# Patient Record
Sex: Male | Born: 1970 | Race: Black or African American | Hispanic: No | Marital: Single
Health system: Midwestern US, Community
[De-identification: ages and names within clinical notes are randomized; demographics above are authoritative.]

## PROBLEM LIST (undated history)

## (undated) DIAGNOSIS — E119 Type 2 diabetes mellitus without complications: Secondary | ICD-10-CM

## (undated) DIAGNOSIS — K219 Gastro-esophageal reflux disease without esophagitis: Secondary | ICD-10-CM

## (undated) DIAGNOSIS — Z720 Tobacco use: Secondary | ICD-10-CM

## (undated) DIAGNOSIS — E785 Hyperlipidemia, unspecified: Secondary | ICD-10-CM

## (undated) DIAGNOSIS — I5189 Other ill-defined heart diseases: Secondary | ICD-10-CM

## (undated) DIAGNOSIS — E1140 Type 2 diabetes mellitus with diabetic neuropathy, unspecified: Secondary | ICD-10-CM

## (undated) DIAGNOSIS — R972 Elevated prostate specific antigen [PSA]: Secondary | ICD-10-CM

## (undated) DIAGNOSIS — R0600 Dyspnea, unspecified: Secondary | ICD-10-CM

## (undated) DIAGNOSIS — E11319 Type 2 diabetes mellitus with unspecified diabetic retinopathy without macular edema: Secondary | ICD-10-CM

## (undated) DIAGNOSIS — N400 Enlarged prostate without lower urinary tract symptoms: Secondary | ICD-10-CM

## (undated) DIAGNOSIS — I5020 Unspecified systolic (congestive) heart failure: Principal | ICD-10-CM

## (undated) DIAGNOSIS — R0989 Other specified symptoms and signs involving the circulatory and respiratory systems: Secondary | ICD-10-CM

## (undated) HISTORY — DX: Hyperlipidemia, unspecified: E78.5

## (undated) HISTORY — DX: Gastro-esophageal reflux disease without esophagitis: K21.9

---
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
## 2005-08-17 HISTORY — PX: EYE SURGERY: SHX253

## 2014-08-17 HISTORY — PX: OTHER SURGICAL HISTORY: SHX169

## 2017-10-14 ENCOUNTER — Emergency Department (HOSPITAL_COMMUNITY)
Admission: EM | Admit: 2017-10-14 | Discharge: 2017-10-14 | Disposition: A | Discharge status: Home / Self Care | Payer: Self-pay | Attending: Emergency Medicine | Admitting: Emergency Medicine

## 2017-10-14 ENCOUNTER — Emergency Department (HOSPITAL_COMMUNITY): Payer: Self-pay

## 2017-10-14 ENCOUNTER — Encounter (HOSPITAL_COMMUNITY): Payer: Self-pay | Admitting: *Deleted

## 2017-10-14 DIAGNOSIS — Y999 Unspecified external cause status: Secondary | ICD-10-CM | POA: Insufficient documentation

## 2017-10-14 DIAGNOSIS — F17210 Nicotine dependence, cigarettes, uncomplicated: Secondary | ICD-10-CM | POA: Insufficient documentation

## 2017-10-14 DIAGNOSIS — E119 Type 2 diabetes mellitus without complications: Secondary | ICD-10-CM | POA: Insufficient documentation

## 2017-10-14 DIAGNOSIS — S93601A Unspecified sprain of right foot, initial encounter: Secondary | ICD-10-CM

## 2017-10-14 DIAGNOSIS — Y939 Activity, unspecified: Secondary | ICD-10-CM | POA: Insufficient documentation

## 2017-10-14 DIAGNOSIS — S92911A Unspecified fracture of right toe(s), initial encounter for closed fracture: Secondary | ICD-10-CM | POA: Insufficient documentation

## 2017-10-14 DIAGNOSIS — Y929 Unspecified place or not applicable: Secondary | ICD-10-CM | POA: Insufficient documentation

## 2017-10-14 HISTORY — DX: Type 2 diabetes mellitus without complications: E11.9

## 2017-10-14 MED ORDER — IBUPROFEN 800 MG PO TABS: 800.0000 mg | ORAL_TABLET | Freq: Three times a day (TID) | ORAL | 0 refills | Status: DC | PRN

## 2017-10-14 MED ORDER — IBUPROFEN 800 MG PO TABS
800.0000 mg | ORAL_TABLET | Freq: Once | ORAL | Status: AC
  Administered 2017-10-14: 800 mg via ORAL
  Filled 2017-10-14: qty 1

## 2017-10-14 NOTE — Discharge Instructions (Signed)
Your x-rays and CAT scan demonstrated a toe fracture on the first and fourth toe but no obvious explanation for the pain in the middle of your foot.  There is likely a sprain in that area.  Ice and ibuprofen, weightbearing as tolerated.  Follow-up with orthopedics if your symptoms worsen.
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
## 2017-10-14 NOTE — ED Notes (Signed)
Ortho tech called 

## 2017-10-14 NOTE — Progress Notes (Addendum)
The patient states he recently moved here from [place] and has been non-compliant with his diabetes medication since May 2018. Glimepiride 4mg  daily. The patient needs a PCP in [place].   Charolotte Ekeom , PharmD. Mobile: [phone]. 10/14/2017,8:50 AM.
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
## 2018-03-02 ENCOUNTER — Other Ambulatory Visit: Payer: Self-pay

## 2018-03-02 ENCOUNTER — Encounter (HOSPITAL_COMMUNITY): Payer: Self-pay | Admitting: Emergency Medicine

## 2018-03-02 ENCOUNTER — Ambulatory Visit (HOSPITAL_COMMUNITY)
Admission: EM | Admit: 2018-03-02 | Discharge: 2018-03-02 | Disposition: A | Discharge status: Home / Self Care | Payer: Self-pay | Attending: Internal Medicine | Admitting: Internal Medicine

## 2018-03-02 DIAGNOSIS — R112 Nausea with vomiting, unspecified: Secondary | ICD-10-CM

## 2018-03-02 DIAGNOSIS — R739 Hyperglycemia, unspecified: Secondary | ICD-10-CM

## 2018-03-02 LAB — POCT I-STAT, CHEM 8
BUN: 10 mg/dL (ref 6–20)
Calcium, Ion: 1.26 mmol/L (ref 1.15–1.40)
Chloride: 100 mmol/L (ref 98–111)
Creatinine, Ser: 1.2 mg/dL (ref 0.61–1.24)
GLUCOSE: 325 mg/dL — AB (ref 70–99)
HEMATOCRIT: 49 % (ref 39.0–52.0)
Hemoglobin: 16.7 g/dL (ref 13.0–17.0)
POTASSIUM: 4.4 mmol/L (ref 3.5–5.1)
SODIUM: 138 mmol/L (ref 135–145)
TCO2: 27 mmol/L (ref 22–32)

## 2018-03-02 LAB — POCT URINALYSIS DIP (DEVICE)
Bilirubin Urine: NEGATIVE
Hgb urine dipstick: NEGATIVE
KETONES UR: NEGATIVE mg/dL
Leukocytes, UA: NEGATIVE
Nitrite: NEGATIVE
PROTEIN: NEGATIVE mg/dL
Specific Gravity, Urine: 1.02 (ref 1.005–1.030)
Urobilinogen, UA: 0.2 mg/dL (ref 0.0–1.0)
pH: 5.5 (ref 5.0–8.0)

## 2018-03-02 MED ORDER — ONDANSETRON 4 MG PO TBDP: 4.0000 mg | ORAL_TABLET | Freq: Three times a day (TID) | ORAL | 0 refills | Status: DC | PRN

## 2018-03-02 MED ORDER — METFORMIN HCL 500 MG PO TABS: 500.0000 mg | ORAL_TABLET | Freq: Two times a day (BID) | ORAL | 0 refills | Status: DC

## 2018-03-02 MED ORDER — BLOOD GLUCOSE MONITOR KIT: PACK | 0 refills | Status: DC

## 2018-03-02 MED ORDER — ONDANSETRON 4 MG PO TBDP
ORAL_TABLET | ORAL | Status: AC
  Filled 2018-03-02: qty 1

## 2018-03-02 MED ORDER — ONDANSETRON 4 MG PO TBDP
4.0000 mg | ORAL_TABLET | Freq: Once | ORAL | Status: AC
  Administered 2018-03-02: 4 mg via ORAL

## 2018-03-02 NOTE — ED Provider Notes (Signed)
[facility]    CSN: [id number] Arrival date & time: 03/02/18  [place]     History   Chief Complaint Chief Complaint  Patient presents with  . Dizziness    HPI Ryan Hensley is a 47 y.o. male.    Ryan Hensley presents with complaints of nausea, vomiting and dizziness which started this morning. Vomited once. No blood. Has eaten since and no further vomiting. Mild nausea. Fatigued. No abdominal pain. One episode of diarrhea. No blood. No fevers. No headache. No longer dizzy. 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 Denies increased urination or thirst. States he was treated with metformin for DM in the past but he moved here over a year ago from [place] and did not get a new PCP so has not been taking metformin. States he does have a glucometer and checks his blood sugars occasionally, yesterday was 189. No chest pain  Or palpitations, no shortness of breath . Without contributing medical history.     ROS per HPI.      Past Medical History:  Diagnosis Date  . Diabetes mellitus without complication ([place])     There are no active problems to display for this patient.   Past Surgical History:  Procedure Laterality Date  . EYE SURGERY         Home Medications    Prior to Admission medications   Medication Sig Start Date End Date Taking? Authorizing Provider  gabapentin (NEURONTIN) 400 MG capsule Take 400 mg by mouth 3 (three) times daily.   Yes [provider]  glimepiride (AMARYL) 4 MG tablet Take 4 mg by mouth daily with breakfast.   Yes [provider]  pantoprazole (PROTONIX) 40 MG tablet Take by mouth daily.   Yes [provider]  ranitidine (ZANTAC) 150 MG tablet Take 150 mg by mouth daily as needed for heartburn.   Yes [provider]  simvastatin (ZOCOR) 40 MG tablet Take 40 mg by mouth daily.   Yes [provider]  sucralfate (CARAFATE) 1 g tablet Take 1 g by mouth 4 (four) times  daily -  with meals and at bedtime.   Yes [provider]  blood glucose meter kit and supplies KIT Dispense based on patient and insurance preference. Use up to four times daily as directed. (FOR ICD-9 250.00, 250.01). 03/02/18   Augusto Gamble B, NP  diphenhydrAMINE (BENADRYL) 25 mg capsule Take 25 mg by mouth every 8 (eight) hours as needed for allergies.    [provider]  ibuprofen (ADVIL,MOTRIN) 800 MG tablet Take 1 tablet (800 mg total) by mouth every 8 (eight) hours as needed. 10/14/17   Hayden Rasmussen, MD  metFORMIN (GLUCOPHAGE) 500 MG tablet Take 1 tablet (500 mg total) by mouth 2 (two) times daily with a meal. 03/02/18   Augusto Gamble B, NP  naproxen sodium (ALEVE) 220 MG tablet Take 220 mg by mouth 2 (two) times daily as needed (pain).    [provider]  ondansetron (ZOFRAN-ODT) 4 MG disintegrating tablet Take 1 tablet (4 mg total) by mouth every 8 (eight) hours as needed for nausea or vomiting. 03/02/18   Zigmund Gottron, NP    Family History Family History  Problem Relation Age of Onset  . Hypertension Mother     Social History Social History   Tobacco Use  . Smoking status: Current Every Day Smoker  . Smokeless tobacco: Never Used  Substance Use Topics  .  Alcohol use: No    Frequency: Never  . Drug use: No     Allergies   Patient has no known allergies.   Review of Systems Review of Systems   Physical Exam Triage Vital Signs ED Triage Vitals  Enc Vitals Group     BP 03/02/18 1741 108/73     Pulse Rate 03/02/18 1741 78     Resp 03/02/18 1741 18     Temp 03/02/18 1741 98.4 F (36.9 C)     Temp Source 03/02/18 1741 Oral     SpO2 03/02/18 1741 99 %     Weight --      Height --      Head Circumference --      Peak Flow --      Pain Score 03/02/18 1734 4     Pain Loc --      Pain Edu? --      Excl. in [place]? --    No data found.  Updated Vital Signs BP 108/73 (BP Location: Left Arm)   Pulse 78   Temp 98.4 F (36.9 C)  (Oral)   Resp 18   SpO2 99%    Physical Exam  Constitutional: He is oriented to person, place, and time. He appears well-developed and well-nourished.  HENT:  Head: Normocephalic and atraumatic.  Right Ear: External ear normal.  Left Ear: External ear normal.  Eyes: Pupils are equal, round, and reactive to light. Conjunctivae and EOM are normal.  Neck: Normal range of motion.  Cardiovascular: Normal rate and regular rhythm.  Pulmonary/Chest: Effort normal and breath sounds normal. No respiratory distress.  Abdominal: Soft. Bowel sounds are normal. He exhibits no distension. There is no tenderness. There is no rigidity, no rebound, no guarding, no CVA tenderness, no tenderness at McBurney's point and negative Murphy's sign.  Neurological: He is alert and oriented to person, place, and time.  Skin: Skin is warm and dry.     UC Treatments / Results  Labs (all labs ordered are listed, but only abnormal results are displayed) Labs Reviewed  POCT I-STAT, CHEM 8 - Abnormal; Notable for the following components:      Result Value   Glucose, Bld 325 (*)    All other components within normal limits  POCT URINALYSIS DIP (DEVICE) - Abnormal; Notable for the following components:   Glucose, UA >=1000 (*)    All other components within normal limits    EKG None  Radiology No results found.  Procedures Procedures (including critical care time)  Medications Ordered in UC Medications  ondansetron (ZOFRAN-ODT) disintegrating tablet 4 mg (4 mg Oral Given 03/02/18 1802)    Initial Impression / Assessment and Plan / UC Course  I have reviewed the triage vital signs and the nursing notes.  Pertinent labs & imaging results that were available during my care of the patient were reviewed by me and considered in my medical decision making (see chart for details).     Eating and drinking this evening without difficulty. Tolerating PO intake in clinic today. No vomiting. Normal physical  exam. Vitals stable. Hyperglycemia noted with sugar in urine as well. No ketones. Metformin restarted today. Emphasized significance of following up with a PCP for monitoring and management. Return precautions provided. Patient verbalized understanding and agreeable to plan.  Ambulatory out of clinic without difficulty.    Final Clinical Impressions(s) / UC Diagnoses   Final diagnoses:  Hyperglycemia     Discharge Instructions     Your  blood sugar is very elevated at 325 here today.  Please restart metformin '500mg'$  twice a day.  Please monitor your blood sugar twice a day. If consistently under 90 or over 200 please return to be seen, go to ER if over 400.  Please increase your water intake.  Please establish with a primary care provider for long term management and further evaluation of your diabetes.    ED Prescriptions    Medication Sig Dispense Auth. Provider   metFORMIN (GLUCOPHAGE) 500 MG tablet Take 1 tablet (500 mg total) by mouth 2 (two) times daily with a meal. 60 tablet Augusto Gamble B, NP   blood glucose meter kit and supplies KIT Dispense based on patient and insurance preference. Use up to four times daily as directed. (FOR ICD-9 250.00, 250.01). 1 each Augusto Gamble B, NP   ondansetron (ZOFRAN-ODT) 4 MG disintegrating tablet Take 1 tablet (4 mg total) by mouth every 8 (eight) hours as needed for nausea or vomiting. 12 tablet Zigmund Gottron, NP     Controlled Substance Prescriptions [place] Controlled Substance Registry consulted? Not Applicable   Zigmund Gottron, NP 03/02/18 1830

## 2018-03-02 NOTE — ED Triage Notes (Signed)
Patient noticed dizziness and nausea early this morning.   For the remainder of the day, dozed.  This evening has been trying to eat -egg sandwith, soup.   Patient has not checked cbg-has run out of strips

## 2018-03-02 NOTE — Discharge Instructions (Signed)
Your blood sugar is very elevated at 325 here today.  Please restart metformin 500mg  twice a day.  Please monitor your blood sugar twice a day. If consistently under 90 or over 200 please return to be seen, go to ER if over 400.  Please increase your water intake.  Please establish with a primary care provider for long term management and further evaluation of your diabetes.

## 2018-12-13 ENCOUNTER — Other Ambulatory Visit: Payer: Self-pay

## 2018-12-13 ENCOUNTER — Ambulatory Visit: Payer: Self-pay | Admitting: Internal Medicine

## 2018-12-13 ENCOUNTER — Encounter: Payer: Self-pay | Admitting: Internal Medicine

## 2018-12-13 VITALS — BP 110/70 | HR 78 | Resp 12 | Ht 68.0 in | Wt 163.0 lb

## 2018-12-13 DIAGNOSIS — K219 Gastro-esophageal reflux disease without esophagitis: Secondary | ICD-10-CM | POA: Insufficient documentation

## 2018-12-13 DIAGNOSIS — E118 Type 2 diabetes mellitus with unspecified complications: Secondary | ICD-10-CM | POA: Insufficient documentation

## 2018-12-13 DIAGNOSIS — M545 Low back pain: Secondary | ICD-10-CM

## 2018-12-13 DIAGNOSIS — E782 Mixed hyperlipidemia: Secondary | ICD-10-CM

## 2018-12-13 DIAGNOSIS — G8929 Other chronic pain: Secondary | ICD-10-CM

## 2018-12-13 DIAGNOSIS — E1142 Type 2 diabetes mellitus with diabetic polyneuropathy: Secondary | ICD-10-CM | POA: Insufficient documentation

## 2018-12-13 DIAGNOSIS — E11319 Type 2 diabetes mellitus with unspecified diabetic retinopathy without macular edema: Secondary | ICD-10-CM

## 2018-12-13 DIAGNOSIS — L02212 Cutaneous abscess of back [any part, except buttock]: Secondary | ICD-10-CM

## 2018-12-13 LAB — GLUCOSE, POCT (MANUAL RESULT ENTRY): POC Glucose: 566 mg/dl — AB (ref 70–99)

## 2018-12-13 MED ORDER — FAMOTIDINE 20 MG PO TABS: ORAL_TABLET | ORAL | 6 refills | Status: DC

## 2018-12-13 MED ORDER — AMOXICILLIN-POT CLAVULANATE 875-125 MG PO TABS: ORAL_TABLET | ORAL | 0 refills | Status: DC

## 2018-12-13 MED ORDER — AGAMATRIX ULTRA-THIN LANCETS MISC: 6 refills | Status: DC

## 2018-12-13 MED ORDER — GLIPIZIDE 10 MG PO TABS: 10.0000 mg | ORAL_TABLET | Freq: Two times a day (BID) | ORAL | 3 refills | Status: DC

## 2018-12-13 MED ORDER — AGAMATRIX PRESTO W/DEVICE KIT: PACK | 0 refills | Status: DC

## 2018-12-13 MED ORDER — ASPIRIN EC 81 MG PO TBEC: DELAYED_RELEASE_TABLET | ORAL | Status: DC

## 2018-12-13 MED ORDER — GLUCOSE BLOOD VI STRP: ORAL_STRIP | 6 refills | Status: DC

## 2018-12-13 MED ORDER — METFORMIN HCL 500 MG PO TABS: 500.0000 mg | ORAL_TABLET | Freq: Two times a day (BID) | ORAL | 3 refills | Status: DC

## 2018-12-13 MED ORDER — ATORVASTATIN CALCIUM 40 MG PO TABS: ORAL_TABLET | ORAL | 3 refills | Status: DC

## 2018-12-13 NOTE — Progress Notes (Signed)
..    Subjective:    Patient ID: Ryan Hensley, male   DOB: 1971-06-09, 48 y.o.   MRN: [id number]   HPI   Here to establish  1.  DM:  Diagnosed around 2013.  Weighed 255 lbs.  Was taking Metformin and maybe Glipizide twice daily.   Was not eating a good diet previously. Not clear how physically active he was as well. Ultimately, was switched to glimepiride with better control. Has been off medicine since moved down here from [place] 3 years ago.  Has also been out of work due to back issues since September 2019.  Was working a Naval architect job most recently. Nothing available now due to COVID19 and stay at home orders. 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  No eye check for 4 years.   No diabetic nephropathy No heart disease. 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   2.  Furuncle outbreaks:  Since moved down to [place] from [place] 3 years ago..  Gets them constantly.  Describes some as blackheads.  Often under left axilla and back.  Eventually drain pus.  They have a core of thick white discharge he needs to get out so it does not recur.   Shows me a large ulcerative infected lesion he had on his left posterior shoulder area 2 weeks ago.  3.  Hyperlipidemia:  Was on Simvastatin 40 mg until 3 years ago.  4.  GERD:  Diagnosed in 2013.  Was treated with Ranitidine, sucralfate, Pantoprazole in past. Smokes 6 cigs daily Minimal alcohol  No outpatient medications have been marked as taking for the 12/13/18 encounter (Office Visit) with Julieanne Manson, MD.   No Known Allergies   Past Medical History:  Diagnosis Date  . Diabetes mellitus without complication (HCC)   . GERD (gastroesophageal reflux disease)   . Hyperlipidemia     Past Surgical History:  Procedure Laterality Date  . boxer fracture ORIF Right 2016  . EYE SURGERY  2007   Strabismus surgery, left eye.  Age 27 yo.   Social History   Socioeconomic History  . Marital  status: Legally Separated    Spouse name: Not on file  . Number of children: 0  . Years of education: 67  . Highest education level: Not on file  Occupational History  . Not on file  Social Needs  . Financial resource strain: Not on file  . Food insecurity:    Worry: Never true    Inability: Never true  . Transportation needs:    Medical: No    Non-medical: No  Tobacco Use  . Smoking status: Current Every Day Smoker    Packs/day: 0.25    Years: 31.00    Pack years: 7.75    Types: Cigarettes  . Smokeless tobacco: Never Used  Substance and Sexual Activity  . Alcohol use: Yes    Frequency: Never    Comment: 3 mixed drinks per month  . Drug use: No  . Sexual activity: Not on file  Lifestyle  . Physical activity:    Days per week: Not on file    Minutes per session: Not on file  . Stress: Not on file  Relationships  . Social connections:    Talks on phone: Not on file    Gets together: Not on file    Attends religious service: Not on file    Active member of club or organization: Not on file    Attends  meetings of clubs or organizations: Not on file    Relationship status: Not on file  . Intimate partner violence:    Fear of current or ex partner: Not on file    Emotionally abused: Not on file    Physically abused: Not on file    Forced sexual activity: Not on file  Other Topics Concern  . Not on file  Social History Narrative   Lives with his mother near [address]      Review of Systems    Objective:   BP 110/70 (BP Location: Left Arm, Patient Position: Sitting, Cuff Size: Normal)   Pulse 78   Resp 12   Ht 5\' 8"  (1.727 m)   Wt 163 lb (73.9 kg)   BMI 24.78 kg/m   Physical Exam  NAD HEENT:  Mild left exotropia, PERRL, EOMI, throat without injection MMM.  Diffuse dental decay with missing teeth. Neck:  Supple No adenopathy Chest:  CTA. 8 x 4 cm swelling with erythema of left upper back at scapular level with central opening draining white yellow  purulent fluid.  Extends medially to vertebral line Minimal odor.  Tender. CV:  RRR with normal S1 and S2, No S3, S4 or murmur.  No carotid bruits.  Carotid, radial and DP pulses normal and equal Abd:  S, NT, No HSM or mass, + BS LE:  No edema  Abscess of back:  6 ml of 2 % lidocaine injected SQ to area after cleansing skin.   3cm incision performed with drainage of copious white yellow pustular discharge.   Gauze wick placed. Swab of discharge sent for culture. Dry dressing applied over wound   Assessment & Plan  1.  DM:  CMP, A1C, urine microalbumin/crea. Metformin 500 mg and Glipizide 10 mg twice daily with meals. Monitoring equipment ordered to check sugars twice daily before meals. Discussed lifestyle changes.  2.  Hyperlipidemia:  Atorvastatin 40 mg daily with evening meal.  Lipids today.  3.  GERD:  Famotidine 40 mg at bedtime  4.  Upper left back abscess:  Augmentin 875/125 mg twice daily.   Change dressing at least daily or when dressing saturated. May soak in warm water in tub once wick falls out. CBC Discussed better control of DM would help control recurrence of skin infections. Follow up in 2 days.  5.  Back pain:  Will discuss at follow up visit in 4 -6 weeks.

## 2018-12-13 NOTE — Patient Instructions (Signed)
Change dressing on wound at least once daily and more often if dressing gets soaked with drainage. Try to leave the gauze wick in. May soak in warm water tub twice daily if wick falls out.

## 2018-12-14 LAB — CBC WITH DIFFERENTIAL/PLATELET
Basophils Absolute: 0 10*3/uL (ref 0.0–0.2)
Basos: 0 %
EOS (ABSOLUTE): 0.2 10*3/uL (ref 0.0–0.4)
Eos: 1 %
Hematocrit: 46.9 % (ref 37.5–51.0)
Hemoglobin: 14.9 g/dL (ref 13.0–17.7)
Immature Grans (Abs): 0.1 10*3/uL (ref 0.0–0.1)
Immature Granulocytes: 1 %
Lymphocytes Absolute: 2.8 10*3/uL (ref 0.7–3.1)
Lymphs: 20 %
MCH: 27.6 pg (ref 26.6–33.0)
MCHC: 31.8 g/dL (ref 31.5–35.7)
MCV: 87 fL (ref 79–97)
Monocytes Absolute: 1 10*3/uL — ABNORMAL HIGH (ref 0.1–0.9)
Monocytes: 7 %
Neutrophils Absolute: 10.3 10*3/uL — ABNORMAL HIGH (ref 1.4–7.0)
Neutrophils: 71 %
Platelets: 363 10*3/uL (ref 150–450)
RBC: 5.39 x10E6/uL (ref 4.14–5.80)
RDW: 12.9 % (ref 11.6–15.4)
WBC: 14.4 10*3/uL — ABNORMAL HIGH (ref 3.4–10.8)

## 2018-12-14 LAB — LIPID PANEL W/O CHOL/HDL RATIO
Cholesterol, Total: 233 mg/dL — ABNORMAL HIGH (ref 100–199)
HDL: 32 mg/dL — ABNORMAL LOW (ref >39)
LDL Calculated: 157 mg/dL — ABNORMAL HIGH (ref 0–99)
Triglycerides: 220 mg/dL — ABNORMAL HIGH (ref 0–149)
VLDL Cholesterol Cal: 44 mg/dL — ABNORMAL HIGH (ref 5–40)

## 2018-12-14 LAB — COMPREHENSIVE METABOLIC PANEL
ALT: 14 IU/L (ref 0–44)
AST: 10 IU/L (ref 0–40)
Albumin/Globulin Ratio: 1.3 (ref 1.2–2.2)
Albumin: 3.7 g/dL — ABNORMAL LOW (ref 4.0–5.0)
Alkaline Phosphatase: 146 IU/L — ABNORMAL HIGH (ref 39–117)
BUN/Creatinine Ratio: 11 (ref 9–20)
BUN: 10 mg/dL (ref 6–24)
Bilirubin Total: 0.2 mg/dL (ref 0.0–1.2)
CO2: 22 mmol/L (ref 20–29)
Calcium: 9.4 mg/dL (ref 8.7–10.2)
Chloride: 95 mmol/L — ABNORMAL LOW (ref 96–106)
Creatinine, Ser: 0.95 mg/dL (ref 0.76–1.27)
GFR calc Af Amer: 109 mL/min/{1.73_m2} (ref >59)
GFR calc non Af Amer: 94 mL/min/{1.73_m2} (ref >59)
Globulin, Total: 2.9 g/dL (ref 1.5–4.5)
Glucose: 564 mg/dL (ref 65–99)
Potassium: 4.9 mmol/L (ref 3.5–5.2)
Sodium: 134 mmol/L (ref 134–144)
Total Protein: 6.6 g/dL (ref 6.0–8.5)

## 2018-12-14 LAB — MICROALBUMIN / CREATININE URINE RATIO
Creatinine, Urine: 27.5 mg/dL
Microalb/Creat Ratio: <11 mg/g creat (ref 0–29)
Microalbumin, Urine: <3 ug/mL

## 2018-12-14 LAB — HGB A1C W/O EAG: Hgb A1c MFr Bld: 15.2 % — ABNORMAL HIGH (ref 4.8–5.6)

## 2018-12-15 ENCOUNTER — Ambulatory Visit: Payer: Self-pay | Admitting: Internal Medicine

## 2018-12-15 ENCOUNTER — Other Ambulatory Visit: Payer: Self-pay

## 2018-12-15 ENCOUNTER — Encounter: Payer: Self-pay | Admitting: Internal Medicine

## 2018-12-15 VITALS — BP 102/70 | HR 84 | Resp 12 | Ht 68.0 in | Wt 165.0 lb

## 2018-12-15 DIAGNOSIS — E118 Type 2 diabetes mellitus with unspecified complications: Secondary | ICD-10-CM

## 2018-12-15 DIAGNOSIS — L02212 Cutaneous abscess of back [any part, except buttock]: Secondary | ICD-10-CM

## 2018-12-15 MED ORDER — DOXYCYCLINE HYCLATE 100 MG PO TABS: 100.0000 mg | ORAL_TABLET | Freq: Two times a day (BID) | ORAL | 0 refills | Status: DC

## 2018-12-15 NOTE — Progress Notes (Signed)
    Subjective:    Patient ID: Ryan Hensley, male   DOB: March 09, 1971, 48 y.o.   MRN: [id number]  HPI   Left upper back abscess:  Having fair amount of drainage.   Still painful. No fever. Did not get started on Augmentin until yesterday.  Not clear he took a dose yet today and early afternoon. Culture growing Staph aureus.  Sensitivities pending. Sugars now in 300s--drinking Gatorade.  Current Meds  Medication Sig  . amoxicillin-clavulanate (AUGMENTIN) 875-125 MG tablet 1 tab by mouth twice daily for 10 days  . atorvastatin (LIPITOR) 40 MG tablet 1 tab by mouth daily with evening meal  . famotidine (PEPCID) 20 MG tablet 2 tabs by mouth at bedtime  . glipiZIDE (GLUCOTROL) 10 MG tablet Take 1 tablet (10 mg total) by mouth 2 (two) times daily before a meal.  . metFORMIN (GLUCOPHAGE) 500 MG tablet Take 1 tablet (500 mg total) by mouth 2 (two) times daily with a meal.   No Known Allergies   Review of Systems    Objective:   BP 102/70 (BP Location: Left Arm, Patient Position: Sitting, Cuff Size: Normal)   Pulse 84   Resp 12   Ht 5\' 8"  (1.727 m)   Wt 165 lb (74.8 kg)   BMI 25.09 kg/m   Physical Exam   Left upper back abscess:  Shonna Chock in place.  Significant drainage of thick white discharge without odor.   3 cc 2% lidocaine injected about the wound as patient does not tolerate manipulation. Wound probed as patient could tolerated to break up abscess walls.   Wound packed again with gauze wick and dressing applied.   Assessment & Plan   Left upper back abscess:  Not clear he is caring for this as he should.  Add Doxycycline while waiting for sensitivities for coverage of possibly resistant staph. Follow up tomorrow to reassess.

## 2018-12-16 ENCOUNTER — Ambulatory Visit: Payer: Self-pay | Admitting: Internal Medicine

## 2018-12-16 ENCOUNTER — Encounter: Payer: Self-pay | Admitting: Internal Medicine

## 2018-12-16 VITALS — BP 102/70 | HR 78 | Temp 98.4°F | Resp 12 | Ht 68.0 in | Wt 163.0 lb

## 2018-12-16 DIAGNOSIS — E118 Type 2 diabetes mellitus with unspecified complications: Secondary | ICD-10-CM

## 2018-12-16 DIAGNOSIS — L02212 Cutaneous abscess of back [any part, except buttock]: Secondary | ICD-10-CM

## 2018-12-16 NOTE — Progress Notes (Signed)
    Subjective:    Patient ID: Ryan Hensley, male   DOB: 01/12/1971, 48 y.o.   MRN: [id number]   HPI  Follow up of large back abscess Back feeling better Still with fair amount of drainage. No fevers. Blood glucose in 300s, down from 500s. Describes a high carb diet still  Current Meds  Medication Sig  . amoxicillin-clavulanate (AUGMENTIN) 875-125 MG tablet 1 tab by mouth twice daily for 10 days  . aspirin EC 81 MG tablet 1 tab by mouth daily with morning meal  . atorvastatin (LIPITOR) 40 MG tablet 1 tab by mouth daily with evening meal  . doxycycline (VIBRA-TABS) 100 MG tablet Take 1 tablet (100 mg total) by mouth 2 (two) times daily.  . famotidine (PEPCID) 20 MG tablet 2 tabs by mouth at bedtime  . glipiZIDE (GLUCOTROL) 10 MG tablet Take 1 tablet (10 mg total) by mouth 2 (two) times daily before a meal.  . metFORMIN (GLUCOPHAGE) 500 MG tablet Take 1 tablet (500 mg total) by mouth 2 (two) times daily with a meal.   No Known Allergies   Review of Systems    Objective:   BP 102/70 (BP Location: Left Arm, Patient Position: Sitting, Cuff Size: Normal)   Pulse 78   Temp 98.4 F (36.9 C) (Oral)   Resp 12   Ht 5\' 8"  (1.727 m)   Wt 163 lb (73.9 kg)   BMI 24.78 kg/m   Physical Exam  NAD Erythema and swelling down about 2 cm of out diameter of wound.   Able to express thick yellow discharge still, but much decreased.   Good color of underlying tissue. Flushed wound with sterile saline and expressed remaining fluid. Gauze wick packing replaced.    Assessment & Plan   Left upper back abscess:  Showing significant improvement today. Sensitivities came back as patient leaving.  Augmentin not tested and sensitive to Tetracyclines. Continue Doxycycline 100 mg twice daily and stop Augmentin.  May remove wick tomorrow with dressing change. To call if any worsening over the weekend. Follow up Monday.

## 2018-12-18 LAB — ANAEROBIC AND AEROBIC CULTURE

## 2018-12-19 ENCOUNTER — Ambulatory Visit: Payer: Self-pay | Admitting: Internal Medicine

## 2018-12-19 ENCOUNTER — Other Ambulatory Visit: Payer: Self-pay

## 2018-12-19 ENCOUNTER — Encounter: Payer: Self-pay | Admitting: Internal Medicine

## 2018-12-19 VITALS — BP 124/72 | HR 84 | Resp 12 | Ht 68.0 in | Wt 168.0 lb

## 2018-12-19 DIAGNOSIS — E118 Type 2 diabetes mellitus with unspecified complications: Secondary | ICD-10-CM

## 2018-12-19 DIAGNOSIS — L02212 Cutaneous abscess of back [any part, except buttock]: Secondary | ICD-10-CM | POA: Insufficient documentation

## 2018-12-19 MED ORDER — METFORMIN HCL 1000 MG PO TABS: 1000.0000 mg | ORAL_TABLET | Freq: Two times a day (BID) | ORAL | 3 refills | Status: DC

## 2018-12-19 NOTE — Patient Instructions (Signed)
Take 2 of your 500 mg Metformin twice daily now until you move to the 1000 mg--then go back to 1 tab twice daily.

## 2018-12-19 NOTE — Progress Notes (Signed)
    Subjective:    Patient ID: Fawaz Fidalgo, male   DOB: 28-Mar-1971, 48 y.o.   MRN: [id number]   HPI   Back Abscess:  Little discharge now.  Pain significantly decreased. Not allowing water to run over the area with showering. Shonna Chock out.   Day #4 of 10 for Doxycycline.  2.  DM:  Sugars still in low 300s.  Taking Glipizide 10 mg twice daily and Metformin 500 mg twice daily. Eating several servings of fruit as fresh fruit.  Only 2 servings of veggies daily. Limiting breads/pastas   Current Meds  Medication Sig  . aspirin EC 81 MG tablet 1 tab by mouth daily with morning meal  . atorvastatin (LIPITOR) 40 MG tablet 1 tab by mouth daily with evening meal  . doxycycline (VIBRA-TABS) 100 MG tablet Take 1 tablet (100 mg total) by mouth 2 (two) times daily.  . famotidine (PEPCID) 20 MG tablet 2 tabs by mouth at bedtime  . glipiZIDE (GLUCOTROL) 10 MG tablet Take 1 tablet (10 mg total) by mouth 2 (two) times daily before a meal.  . metFORMIN (GLUCOPHAGE) 500 MG tablet Take 1 tablet (500 mg total) by mouth 2 (two) times daily with a meal.   No Known Allergies   Review of Systems    Objective:   BP 124/72 (BP Location: Left Arm, Patient Position: Sitting, Cuff Size: Normal)   Pulse 84   Resp 12   Ht 5\' 8"  (1.727 m)   Wt 168 lb (76.2 kg)   BMI 25.54 kg/m   Physical Exam  NAD  Back wound:  Much smaller and much less elevated from back.  Much less thick white discharge.  Swelling of tissue only about 2 cm out from wound edge.   Irrigated with sterile saline with almost clear discharge with last irrigation.   Much less tenderness with manipulation today.   Assessment & Plan   1.  Back Abscess:  Healing nicely now. Dry dressing applied To allow soap and water to wash over wound with shower.  Just need to make sure adequately rinsed afterward. Keep covered and change dressing daily or more often depending on drainage.  2.  DM:  Increase Metformin to 1000 mg twice daily. Eat more  veggies.

## 2018-12-26 ENCOUNTER — Other Ambulatory Visit: Payer: Self-pay

## 2018-12-26 ENCOUNTER — Ambulatory Visit: Payer: Self-pay | Admitting: Internal Medicine

## 2018-12-26 ENCOUNTER — Encounter: Payer: Self-pay | Admitting: Internal Medicine

## 2018-12-26 VITALS — BP 110/70 | HR 76 | Resp 12 | Ht 68.0 in | Wt 166.0 lb

## 2018-12-26 DIAGNOSIS — E118 Type 2 diabetes mellitus with unspecified complications: Secondary | ICD-10-CM

## 2018-12-26 DIAGNOSIS — L02212 Cutaneous abscess of back [any part, except buttock]: Secondary | ICD-10-CM

## 2018-12-26 LAB — GLUCOSE, POCT (MANUAL RESULT ENTRY): POC Glucose: 247 mg/dl — AB (ref 70–99)

## 2018-12-26 MED ORDER — DOXYCYCLINE HYCLATE 100 MG PO TABS: ORAL_TABLET | ORAL | 0 refills | Status: DC

## 2018-12-26 NOTE — Patient Instructions (Signed)
When your blood sugar is 300 or higher, think about what you have eaten prior that blood sugar check that may have increased you blood sugar and make changes accordingly.  Stop Famotidine for now and see if abdominal discomfort is gone without it.

## 2018-12-26 NOTE — Progress Notes (Signed)
    Subjective:    Patient ID: Ryan Hensley, male   DOB: 1971-01-10, 48 y.o.   MRN: [id number]   HPI   1.  DM:  Sugars not consistent with his new glucometer and old one.  In past 24 hours, down into mid to upper 200s. Variations from 10 to 50.  Generally, old glucometer lower.   He feels he is eating healthier. He is walking everywhere.  Max of 30 minutes He is receiving "Mom's Meals"  States they are meals for diabetics online.  Some sort of disability website he signed onto.   2.  Back abscess:  Still very itchy, but trying to avoid scratching.  Keeps it covered.  Minimal discharge. Took last dose of 10 day course of Doxycycline 100 mg twice daily last evening.  Current Meds  Medication Sig  . AgaMatrix Ultra-Thin Lancets MISC Check blood glucose twice daily before meals  . aspirin EC 81 MG tablet 1 tab by mouth daily with morning meal  . atorvastatin (LIPITOR) 40 MG tablet 1 tab by mouth daily with evening meal  . Blood Glucose Monitoring Suppl (AGAMATRIX PRESTO) w/Device KIT Check blood sugars twice daily before meals  . famotidine (PEPCID) 20 MG tablet 2 tabs by mouth at bedtime  . glipiZIDE (GLUCOTROL) 10 MG tablet Take 1 tablet (10 mg total) by mouth 2 (two) times daily before a meal.  . glucose blood (AGAMATRIX PRESTO TEST) test strip Check blood blood sugar twice daily before meals  . metFORMIN (GLUCOPHAGE) 1000 MG tablet Take 1 tablet (1,000 mg total) by mouth 2 (two) times daily with a meal.   No Known Allergies   Review of Systems    Objective:   BP 110/70 (BP Location: Left Arm, Patient Position: Sitting, Cuff Size: Normal)   Pulse 76   Resp 12   Ht '5\' 8"'$  (1.727 m)   Wt 166 lb (75.3 kg)   BMI 25.24 kg/m   Physical Exam  NAD Opening of abscess down to about 2 cm with scant white discharge, no erythema or swelling.  Wound pocket about 1 cm depth now.  Good red tissue at base.   Irrigated with sterile saline and redressed.   Assessment & Plan   1.  DM:   Sugars down to about half of what they were when established about 2 weeks ago.   To pay attention to what he is eating when his sugars go above 300. Asked him to gradually increase his walking and other physical activity to 60 minutes daily.  2.  Back Abscess:  Still with some scant discharge.  Doxycycline 100 mg twice daily for 7 more days as pocket closes  Call if any problems with sugars or infection. Keep appt in 1 month

## 2019-01-24 ENCOUNTER — Encounter: Payer: Self-pay | Admitting: Internal Medicine

## 2019-01-24 ENCOUNTER — Ambulatory Visit: Payer: Self-pay | Admitting: Internal Medicine

## 2019-01-24 ENCOUNTER — Other Ambulatory Visit: Payer: Self-pay

## 2019-01-24 VITALS — BP 98/60 | HR 82 | Resp 12 | Ht 68.0 in | Wt 171.0 lb

## 2019-01-24 DIAGNOSIS — M545 Low back pain, unspecified: Secondary | ICD-10-CM

## 2019-01-24 DIAGNOSIS — E782 Mixed hyperlipidemia: Secondary | ICD-10-CM

## 2019-01-24 DIAGNOSIS — G8929 Other chronic pain: Secondary | ICD-10-CM

## 2019-01-24 DIAGNOSIS — E118 Type 2 diabetes mellitus with unspecified complications: Secondary | ICD-10-CM

## 2019-01-24 DIAGNOSIS — L02212 Cutaneous abscess of back [any part, except buttock]: Secondary | ICD-10-CM

## 2019-01-24 MED ORDER — NAPROXEN 500 MG PO TABS: 500.0000 mg | ORAL_TABLET | Freq: Two times a day (BID) | ORAL | 6 refills | Status: DC

## 2019-01-24 MED ORDER — CYCLOBENZAPRINE HCL 10 MG PO TABS: ORAL_TABLET | ORAL | 6 refills | Status: DC

## 2019-01-24 NOTE — Patient Instructions (Signed)
Bath soak for scab on back for 20 minutes in warm water, pat dry.  See if scab will gently peel off.  Do this daily. Whether it does or not, apply antibiotic ointment and keep covered until completely healed.

## 2019-01-24 NOTE — Progress Notes (Signed)
Subjective:    Patient ID: Ryan Hensley, male   DOB: 08-01-1971, 48 y.o.   MRN: [id number]   HPI   1.  DM:  Sugars are still almost all above 200 and most 300+ both morning and evening before meals.   He admits to eating a lot of carbohydrates. Difficult to get to what he is eating that is not good, but finally shares he is eating fried chicken 3 times weekly. He is trying to be active walking on a track regularly.  Not missing his medications Metformin at Glipizide twice daily.  2.  Hyperlipidemia:  As above.  Continues Atorvastatin.  3.  Back abscess:  Still itching where the abscess is healing.  4.  Bilateral lower back pain:  Has had problems with low back for many years.   No history of acute injury to back.   Has been evaluated in the past when living in [place].  Maybe 4 years ago.   Told he has 3 levels of DDD. Had an MRI at some point. Went to PT Was told he should have surgery, but he did not want to go through surgery.   Pain is chronic. No radiation. Used to take Aleve.  No history of gastric ulcers.    Current Meds  Medication Sig  . AgaMatrix Ultra-Thin Lancets MISC Check blood glucose twice daily before meals  . aspirin EC 81 MG tablet 1 tab by mouth daily with morning meal  . atorvastatin (LIPITOR) 40 MG tablet 1 tab by mouth daily with evening meal  . Blood Glucose Monitoring Suppl (AGAMATRIX PRESTO) w/Device KIT Check blood sugars twice daily before meals  . glipiZIDE (GLUCOTROL) 10 MG tablet Take 1 tablet (10 mg total) by mouth 2 (two) times daily before a meal.  . glucose blood (AGAMATRIX PRESTO TEST) test strip Check blood blood sugar twice daily before meals  . metFORMIN (GLUCOPHAGE) 1000 MG tablet Take 1 tablet (1,000 mg total) by mouth 2 (two) times daily with a meal.   Allergies  Allergen Reactions  . Shellfish Allergy Swelling     Review of Systems    Objective:   BP 98/60 (BP Location: Left Arm, Patient Position: Sitting, Cuff Size:  Normal)   Pulse 82   Resp 12   Ht '5\' 8"'$  (1.727 m)   Wt 171 lb (77.6 kg)   BMI 26.00 kg/m   Physical Exam  NAD HEENT:  PERRL, EOMI Neck:  Supple, No adenopathy Chest:  CTA CV:  RRR without murmur or rub.  Radial and DP pulses normal and equal Abd:  S, NT, No HSM or mass, + BS LE:  No edema Skin:  Thickened scab over area of upper left back where underwent I & D of abscessed sebaceous cyst  Scant discharge beneath edge of scab inferiorly.  No fluctuance or surrounding erythema or tenderness. MS:  NT over spinous processes of L/S spine.  Mild tenderness over paraspinous musculature, same level. Negative straight leg raise.   Neuro:  Motor 5/5, DTRs 2+/4 throughout.  Sensory grossly normal.  Gait normal   Assessment & Plan   1.  DM:  3 more months of working on diet and physical activity with medication to get sugars down.  A1C end of July with FLP, hepatic profile If not improved will add Lantus once daily.  2.  Hyperlipidemia:  Labs end of July as above.  3.  Back abscess:  Needs to keep covered after bath soak for 20 minutes and  apply antibiotic ointment to soften up thick scab and tease it off.  Tissue underneath appears healthy, but scab is allowing minimal discharge to collect underneath superior edge.  4.  Low back pain:  Naproxen 500 mg twice daily with food as this has worked for him in past.   Cyclobenzaprine 5-10 mg for bedtime. Send for previous records from [place] when previously evaluated.

## 2019-03-10 ENCOUNTER — Other Ambulatory Visit: Payer: Self-pay

## 2019-03-13 ENCOUNTER — Other Ambulatory Visit: Payer: Self-pay

## 2019-03-13 ENCOUNTER — Other Ambulatory Visit (INDEPENDENT_AMBULATORY_CARE_PROVIDER_SITE_OTHER): Payer: Self-pay

## 2019-03-13 DIAGNOSIS — Z79899 Other long term (current) drug therapy: Secondary | ICD-10-CM

## 2019-03-13 DIAGNOSIS — E118 Type 2 diabetes mellitus with unspecified complications: Secondary | ICD-10-CM

## 2019-03-13 DIAGNOSIS — E782 Mixed hyperlipidemia: Secondary | ICD-10-CM

## 2019-03-14 ENCOUNTER — Ambulatory Visit: Payer: Self-pay | Admitting: Internal Medicine

## 2019-03-14 ENCOUNTER — Encounter: Payer: Self-pay | Admitting: Internal Medicine

## 2019-03-14 VITALS — BP 98/60 | HR 94 | Resp 12 | Ht 68.0 in | Wt 172.0 lb

## 2019-03-14 DIAGNOSIS — E118 Type 2 diabetes mellitus with unspecified complications: Secondary | ICD-10-CM

## 2019-03-14 DIAGNOSIS — Z23 Encounter for immunization: Secondary | ICD-10-CM

## 2019-03-14 DIAGNOSIS — E782 Mixed hyperlipidemia: Secondary | ICD-10-CM

## 2019-03-14 LAB — COMPREHENSIVE METABOLIC PANEL
ALT: 21 IU/L (ref 0–44)
AST: 17 IU/L (ref 0–40)
Albumin/Globulin Ratio: 1.7 (ref 1.2–2.2)
Albumin: 3.8 g/dL — ABNORMAL LOW (ref 4.0–5.0)
Alkaline Phosphatase: 96 IU/L (ref 39–117)
BUN/Creatinine Ratio: 11 (ref 9–20)
BUN: 11 mg/dL (ref 6–24)
Bilirubin Total: 0.2 mg/dL (ref 0.0–1.2)
CO2: 20 mmol/L (ref 20–29)
Calcium: 8.7 mg/dL (ref 8.7–10.2)
Chloride: 103 mmol/L (ref 96–106)
Creatinine, Ser: 1.04 mg/dL (ref 0.76–1.27)
GFR calc Af Amer: 98 mL/min/{1.73_m2} (ref >59)
GFR calc non Af Amer: 85 mL/min/{1.73_m2} (ref >59)
Globulin, Total: 2.3 g/dL (ref 1.5–4.5)
Glucose: 350 mg/dL — ABNORMAL HIGH (ref 65–99)
Potassium: 4.7 mmol/L (ref 3.5–5.2)
Sodium: 139 mmol/L (ref 134–144)
Total Protein: 6.1 g/dL (ref 6.0–8.5)

## 2019-03-14 LAB — LIPID PANEL W/O CHOL/HDL RATIO
Cholesterol, Total: 241 mg/dL — ABNORMAL HIGH (ref 100–199)
HDL: 46 mg/dL (ref >39)
LDL Calculated: 164 mg/dL — ABNORMAL HIGH (ref 0–99)
Triglycerides: 154 mg/dL — ABNORMAL HIGH (ref 0–149)
VLDL Cholesterol Cal: 31 mg/dL (ref 5–40)

## 2019-03-14 LAB — HGB A1C W/O EAG: Hgb A1c MFr Bld: 11.6 % — ABNORMAL HIGH (ref 4.8–5.6)

## 2019-03-14 MED ORDER — LANTUS SOLOSTAR 100 UNIT/ML ~~LOC~~ SOPN: PEN_INJECTOR | SUBCUTANEOUS | 11 refills | Status: DC

## 2019-03-14 NOTE — Progress Notes (Signed)
    Subjective:    Patient ID: Ryan Hensley, male   DOB: [birth year], 48 y.o.   MRN: [id number]   HPI   1.  DM:  Sugars almost always above 200. A1C improved, but stilll too high.  States he is taking glipizide and Metformin without fail. States he is eating in a healthy manner.  2.  Hyperlipidemia:  Total and LDL too high.  Not clear he is taking his atorvastatin daily  Current Meds  Medication Sig  . AgaMatrix Ultra-Thin Lancets MISC Check blood glucose twice daily before meals  . aspirin EC 81 MG tablet 1 tab by mouth daily with morning meal  . atorvastatin (LIPITOR) 40 MG tablet 1 tab by mouth daily with evening meal  . Blood Glucose Monitoring Suppl (AGAMATRIX PRESTO) w/Device KIT Check blood sugars twice daily before meals  . cyclobenzaprine (FLEXERIL) 10 MG tablet 1/2 to 1 tab by mouth at bedtime for back pain  . glipiZIDE (GLUCOTROL) 10 MG tablet Take 1 tablet (10 mg total) by mouth 2 (two) times daily before a meal.  . glucose blood (AGAMATRIX PRESTO TEST) test strip Check blood blood sugar twice daily before meals  . metFORMIN (GLUCOPHAGE) 1000 MG tablet Take 1 tablet (1,000 mg total) by mouth 2 (two) times daily with a meal.  . naproxen (NAPROSYN) 500 MG tablet Take 1 tablet (500 mg total) by mouth 2 (two) times daily with a meal.   Allergies  Allergen Reactions  . Shellfish Allergy Swelling     Review of Systems    Objective:   BP 98/60 (BP Location: Left Arm, Patient Position: Sitting, Cuff Size: Normal)   Pulse 94   Resp 12   Ht '5\' 8"'$  (1.727 m)   Wt 172 lb (78 kg)   BMI 26.15 kg/m   Physical Exam  NAD Lungs:  CTA CV:  RRR without murmur or rub.  Radial pulses normal and equal. LE:  No edema Back:  Previous abscess I and D healed.   Assessment & Plan  1.  DM:  Appears he is taking Metformin and Glipizide.  Add Lantus 10 units in the morning daily. To continue to work on dietary and physical activity changes. Pneumococcal 23 v given today.  2.   Hyperlipidemia:  Found out later he only took Atorvastatin for 1 month through May and has not refilled since.  Discussed with patient he needs to refill monthly and take daily with evening meal.  Will recheck this with followup in 2 months.

## 2019-03-15 ENCOUNTER — Telehealth: Payer: Self-pay | Admitting: Internal Medicine

## 2019-03-15 NOTE — Telephone Encounter (Signed)
Spoke with patient. Received everything he needed at the [facility]

## 2019-03-15 NOTE — Telephone Encounter (Signed)
Patient called requesting needles to be sent to [facility] on [address].  Please advise.

## 2019-04-24 ENCOUNTER — Telehealth: Payer: Self-pay | Admitting: Internal Medicine

## 2019-04-26 NOTE — Telephone Encounter (Signed)
Spoke with patient and the PHD. Yes patient has picked up both the lantus and atorvastatin. Patient states he is due to pick up his atorvastatin again and the PHD did confirm that as well. Lab appointment scheduled for 05/12/2019. Patient is aware and verbalized understanding.

## 2019-05-12 ENCOUNTER — Other Ambulatory Visit: Payer: Self-pay

## 2019-05-12 DIAGNOSIS — E782 Mixed hyperlipidemia: Secondary | ICD-10-CM

## 2019-05-12 DIAGNOSIS — Z79899 Other long term (current) drug therapy: Secondary | ICD-10-CM

## 2019-05-13 LAB — HEPATIC FUNCTION PANEL
ALT: 21 IU/L (ref 0–44)
AST: 16 IU/L (ref 0–40)
Albumin: 3.5 g/dL — ABNORMAL LOW (ref 4.0–5.0)
Alkaline Phosphatase: 119 IU/L — ABNORMAL HIGH (ref 39–117)
Bilirubin Total: 0.3 mg/dL (ref 0.0–1.2)
Bilirubin, Direct: 0.1 mg/dL (ref 0.00–0.40)
Total Protein: 6.1 g/dL (ref 6.0–8.5)

## 2019-05-13 LAB — LIPID PANEL W/O CHOL/HDL RATIO
Cholesterol, Total: 191 mg/dL (ref 100–199)
HDL: 48 mg/dL (ref >39)
LDL Chol Calc (NIH): 117 mg/dL — ABNORMAL HIGH (ref 0–99)
Triglycerides: 147 mg/dL (ref 0–149)
VLDL Cholesterol Cal: 26 mg/dL (ref 5–40)

## 2019-05-16 ENCOUNTER — Ambulatory Visit: Payer: Self-pay | Admitting: Internal Medicine

## 2019-05-16 ENCOUNTER — Other Ambulatory Visit: Payer: Self-pay

## 2019-05-16 ENCOUNTER — Encounter: Payer: Self-pay | Admitting: Internal Medicine

## 2019-05-16 VITALS — BP 102/60 | HR 92 | Resp 12 | Ht 68.0 in | Wt 184.0 lb

## 2019-05-16 DIAGNOSIS — G8929 Other chronic pain: Secondary | ICD-10-CM

## 2019-05-16 DIAGNOSIS — M545 Low back pain, unspecified: Secondary | ICD-10-CM

## 2019-05-16 DIAGNOSIS — L731 Pseudofolliculitis barbae: Secondary | ICD-10-CM

## 2019-05-16 DIAGNOSIS — F439 Reaction to severe stress, unspecified: Secondary | ICD-10-CM

## 2019-05-16 DIAGNOSIS — E782 Mixed hyperlipidemia: Secondary | ICD-10-CM

## 2019-05-16 DIAGNOSIS — E118 Type 2 diabetes mellitus with unspecified complications: Secondary | ICD-10-CM

## 2019-05-16 MED ORDER — LANTUS SOLOSTAR 100 UNIT/ML ~~LOC~~ SOPN: PEN_INJECTOR | SUBCUTANEOUS | 11 refills | Status: DC

## 2019-05-16 MED ORDER — ATORVASTATIN CALCIUM 80 MG PO TABS: ORAL_TABLET | ORAL | 11 refills | Status: DC

## 2019-05-16 NOTE — Progress Notes (Signed)
Subjective:    Patient ID: Ryan Hensley, male   DOB: 1970/08/26, 48 y.o.   MRN: [id number]   HPI   1.  DM:  Stressed with money problems and having health issues.  Falling asleep and forgetting to check sugars and his evening Glipizide and Metformin.  States this happens maybe 5 times a month. He is using Lantus. Metformin 1000 twice daily. Glipizide 10 mg twice daily Breakfast:  [place] sausage and hard boiled egg.  Coffee and sugar free [place].  Unsweetened tea.  Sometimes Gatorade or Power Ade.  Rarely juice.  Sometimes skips lunch, but if eats:  Salad:  Grilled chicken, lettuce, tomatoes, cucumber, tomato or onion.  A1C 11.6 % in July  2.  Stressed:  Can't work and back keeping him from doing what he needs to do. Was denied DSS.  This is the first time in [place] and 3 times in [place]. Living with his mother. Difficulty with bills--donating plasma. Food access is fine.  3.  Low back pain:  Is still working with PT.  Goes once weekly.  Does the exercises at home.  Have not seen his records from [place] yet.    4. Hyperlipidemia:  Improved with Atorvastatin 40 mg daily, but still not at goal. Lipid Panel     Component Value Date/Time   CHOL 191 05/12/2019 0840   TRIG 147 05/12/2019 0840   HDL 48 05/12/2019 0840   LDLCALC 117 (H) 05/12/2019 0840   LABVLDL 26 05/12/2019 0840     5.  Ingrowing hairs on beard area of face.   Current Meds  Medication Sig  . AgaMatrix Ultra-Thin Lancets MISC Check blood glucose twice daily before meals  . aspirin EC 81 MG tablet 1 tab by mouth daily with morning meal  . atorvastatin (LIPITOR) 40 MG tablet 1 tab by mouth daily with evening meal  . Blood Glucose Monitoring Suppl (AGAMATRIX PRESTO) w/Device KIT Check blood sugars twice daily before meals  . CINNAMON PO Take 1,000 mg by mouth. 1 twice a day  . cyclobenzaprine (FLEXERIL) 10 MG tablet 1/2 to 1 tab by mouth at bedtime for back pain  . famotidine (PEPCID) 20 MG tablet 2 tabs by mouth at  bedtime  . glipiZIDE (GLUCOTROL) 10 MG tablet Take 1 tablet (10 mg total) by mouth 2 (two) times daily before a meal.  . glucose blood (AGAMATRIX PRESTO TEST) test strip Check blood blood sugar twice daily before meals  . Insulin Glargine (LANTUS SOLOSTAR) 100 UNIT/ML Solostar Pen 10 units injected subcutaneously daily in the morning before breakfast  . metFORMIN (GLUCOPHAGE) 1000 MG tablet Take 1 tablet (1,000 mg total) by mouth 2 (two) times daily with a meal.  . naproxen (NAPROSYN) 500 MG tablet Take 1 tablet (500 mg total) by mouth 2 (two) times daily with a meal.  . vitamin B-12 (CYANOCOBALAMIN) 50 MCG tablet Take 50 mcg by mouth. 1 twice a day   Allergies  Allergen Reactions  . Shellfish Allergy Swelling     Review of Systems    Objective:   BP 102/60 (BP Location: Left Arm, Patient Position: Sitting, Cuff Size: Normal)   Pulse 92   Resp 12   Ht '5\' 8"'$  (1.727 m)   Wt 184 lb (83.5 kg)   BMI 27.98 kg/m   Physical Exam  NAD HEENT:  PERRl, EOMI, perifollicular papules/pustules of beard area, especially under jaw line/on neck. Neck:  Supple, no adenopathy, no thyromegaly Chest:  CTA CV:  RRR without  murmur or rub.  Radial and DP pulses normal and equal Abd:  S, NT, No HSM or mass, + BS LE:  No edema.  Diabetic foot exam was performed with the following findings:   No deformities, ulcerations, or other skin breakdown Normal sensation of 10g monofilament Intact posterior tibialis and dorsalis pedis pulses     Assessment & Plan  1.  DM:  Continue Metformin and Glipizide.  Add Lantus 15 units daily.  Encouraged to schedule his day better with meals and meds. Alarm on phone to remind him of meds and to eat. A1C in 6 weeks. Influenza vaccine Oct 15  2.  Hyperlipidemia:  Increase Atorvastatin to 80 mg daily.  Fasting labs in 6 weeks:  FLP, CMP  3.  Ingrown hair of beard area:  Referral to Dermatology for improved care.  4.  Chronic low back pain:  Encouraged water  exercise/swimming:  [facility] or other affordable option.  5.  Stress:  Referral to [place].

## 2019-05-16 NOTE — Patient Instructions (Signed)
[facility] [address] [place], [place] [address]  [phone] Hours of Operation Mondays to Thursdays: 8 am to 8 pm Fridays: 9 am to 8 pm Saturdays: 9 am to 1 pm Sundays: Closed  

## 2019-05-22 ENCOUNTER — Telehealth: Payer: Self-pay | Admitting: Internal Medicine

## 2019-05-22 NOTE — Telephone Encounter (Signed)
Patient called requesting information on acne medication; patient stated he has been having acne issues and he discussed with Dr. Amil Amen at last ov. Patient asking for medication to be sent to [facility] if it can be sent there if not then [facility].  To  Dr. Amil Amen for further directions.

## 2019-05-29 ENCOUNTER — Other Ambulatory Visit: Payer: Self-pay | Admitting: Licensed Clinical Social Worker

## 2019-06-13 ENCOUNTER — Encounter: Payer: Self-pay | Admitting: Internal Medicine

## 2019-06-29 ENCOUNTER — Other Ambulatory Visit: Payer: Self-pay

## 2019-07-04 ENCOUNTER — Ambulatory Visit: Payer: Self-pay | Admitting: Internal Medicine

## 2019-07-05 ENCOUNTER — Telehealth: Payer: Self-pay

## 2019-07-05 NOTE — Telephone Encounter (Signed)
Social worker called patient to schedule an appointment to see clinical social worker for a CCA. Patient did not answer, so social worker left a message inviting patient to call back.

## 2019-07-18 NOTE — Telephone Encounter (Signed)
I wrote for him to get a Dermatology referral for care of his beard area.  Does [facility] not have any openings?

## 2019-07-19 NOTE — Telephone Encounter (Signed)
Patient orange card still  expired he was informed to renew it and provide new dates when gets it done.

## 2021-01-21 ENCOUNTER — Ambulatory Visit (HOSPITAL_COMMUNITY)
Admission: EM | Admit: 2021-01-21 | Discharge: 2021-01-21 | Disposition: A | Discharge status: Home / Self Care | Payer: BC Managed Care – PPO | Attending: Urgent Care | Admitting: Urgent Care

## 2021-01-21 ENCOUNTER — Ambulatory Visit (INDEPENDENT_AMBULATORY_CARE_PROVIDER_SITE_OTHER): Payer: BC Managed Care – PPO

## 2021-01-21 ENCOUNTER — Encounter (HOSPITAL_COMMUNITY): Payer: Self-pay

## 2021-01-21 ENCOUNTER — Other Ambulatory Visit: Payer: Self-pay

## 2021-01-21 DIAGNOSIS — M1611 Unilateral primary osteoarthritis, right hip: Secondary | ICD-10-CM | POA: Diagnosis not present

## 2021-01-21 DIAGNOSIS — M25551 Pain in right hip: Secondary | ICD-10-CM

## 2021-01-21 DIAGNOSIS — E1165 Type 2 diabetes mellitus with hyperglycemia: Secondary | ICD-10-CM | POA: Diagnosis not present

## 2021-01-21 LAB — CBG MONITORING, ED: Glucose-Capillary: 347 mg/dL — ABNORMAL HIGH (ref 70–99)

## 2021-01-21 MED ORDER — HYDROCODONE-ACETAMINOPHEN 5-325 MG PO TABS: 1.0000 | ORAL_TABLET | Freq: Four times a day (QID) | ORAL | 0 refills | Status: DC | PRN

## 2021-01-21 NOTE — ED Triage Notes (Signed)
Pt c/o back pain and hip pain x 4 days. Pt states he has always had back pain. Pt states the pain started on Sunday. He states the pain started at the back and moved to his right hip.

## 2021-01-21 NOTE — Discharge Instructions (Signed)
Please make sure that you follow-up with an orthopedist group as soon as possible.  In the meantime you can take hydrocodone for your severe arthritis pain.  Unfortunately I cannot prescribe the medications that I normally would due to your severely uncontrolled diabetes.  He have to make an appointment with your regular doctor to get this back on track.  For diabetes or elevated blood sugar, please make sure you are limiting and avoiding starchy, carbohydrate foods like pasta, breads, sweet breads, pastry, rice, potatoes, desserts. These foods can elevate your blood sugar. Also, limit and avoid drinks that contain a lot of sugar such as sodas, sweet teas, fruit juices.  Drinking plain water will be much more helpful, try 64 ounces of water daily.  It is okay to flavor your water naturally by cutting cucumber, lemon, mint or lime, placing it in a picture with water and drinking it over a period of 24-48 hours as long as it remains refrigerated.  For elevated blood pressure, make sure you are monitoring salt in your diet.  Do not eat restaurant foods and limit processed foods at home. I highly recommend you prepare and cook your own foods at home.  Processed foods include things like frozen meals, pre-seasoned meats and dinners, deli meats, canned foods as these foods contain a high amount of sodium/salt.  Make sure you are paying attention to sodium labels on foods you buy at the grocery store. Buy your spices separately such as garlic powder, onion powder, cumin, cayenne, parsley flakes so that you can avoid seasonings that contain salt. However, salt-free seasonings are available and can be used, an example is Mrs. Dash and includes a lot of different mixtures that do not contain salt.  Lastly, when cooking using oils that are healthier for you is important. This includes olive oil, avocado oil, canola oil. We have discussed a lot of foods to avoid but below is a list of foods that can be very healthy to use  in your diet whether it is for diabetes, cholesterol, high blood pressure, or in general healthy eating.  Salads - kale, spinach, cabbage, spring mix, arugula Fruits - avocadoes, berries (blueberries, raspberries, blackberries), apples, oranges, pomegranate, grapefruit, kiwi Vegetables - asparagus, cauliflower, broccoli, green beans, brussel sprouts, bell peppers, beets; stay away from or limit starchy vegetables like potatoes, carrots, peas Other general foods - kidney beans, egg whites, almonds, walnuts, sunflower seeds, pumpkin seeds, fat free yogurt, almond milk, flax seeds, quinoa, oats  Meat - It is better to eat lean meats and limit your red meat including pork to once a week.  Wild caught fish, chicken breast are good options as they tend to be leaner sources of good protein. Still be mindful of the sodium labels for the meats you buy.  DO NOT EAT ANY FOODS ON THIS LIST THAT YOU ARE ALLERGIC TO. For more specific needs, I highly recommend consulting a dietician or nutritionist but this can definitely be a good starting point.

## 2021-01-21 NOTE — ED Provider Notes (Signed)
[place]   MRN: [id number] DOB: 21-Jun-1971  Subjective:   Ryan Hensley is a 50 y.o. male presenting for 4-day history of acute onset right posterior and lateral hip pain that is severe, rated 10 out of 10.  Symptoms started on Sunday and have rapidly progressed.  Has not responded to over-the-counter pain medicines and rest.  Has not been able to work.  No falls or trauma.  He is also type II diabetic and has not been on his medication.  Has not followed up with his PCP in a while.  No current facility-administered medications for this encounter.  Current Outpatient Medications:  .  AgaMatrix Ultra-Thin Lancets MISC, Check blood glucose twice daily before meals, Disp: 120 each, Rfl: 6 .  aspirin EC 81 MG tablet, 1 tab by mouth daily with morning meal, Disp: , Rfl:  .  atorvastatin (LIPITOR) 80 MG tablet, 1 tab by mouth daily with evening meal, Disp: 30 tablet, Rfl: 11 .  Blood Glucose Monitoring Suppl (AGAMATRIX PRESTO) w/Device KIT, Check blood sugars twice daily before meals, Disp: 1 kit, Rfl: 0 .  CINNAMON PO, Take 1,000 mg by mouth. 1 twice a day, Disp: , Rfl:  .  cyclobenzaprine (FLEXERIL) 10 MG tablet, 1/2 to 1 tab by mouth at bedtime for back pain, Disp: 30 tablet, Rfl: 6 .  famotidine (PEPCID) 20 MG tablet, 2 tabs by mouth at bedtime, Disp: 120 tablet, Rfl: 6 .  glipiZIDE (GLUCOTROL) 10 MG tablet, Take 1 tablet (10 mg total) by mouth 2 (two) times daily before a meal., Disp: 180 tablet, Rfl: 3 .  glucose blood (AGAMATRIX PRESTO TEST) test strip, Check blood blood sugar twice daily before meals, Disp: 120 each, Rfl: 6 .  Insulin Glargine (LANTUS SOLOSTAR) 100 UNIT/ML Solostar Pen, 15 units injected subcutaneously daily in the morning before breakfast, Disp: 2 pen, Rfl: 11 .  metFORMIN (GLUCOPHAGE) 1000 MG tablet, Take 1 tablet (1,000 mg total) by mouth 2 (two) times daily with a meal., Disp: 180 tablet, Rfl: 3 .  naproxen (NAPROSYN) 500 MG tablet, Take 1 tablet  (500 mg total) by mouth 2 (two) times daily with a meal., Disp: 60 tablet, Rfl: 6 .  vitamin B-12 (CYANOCOBALAMIN) 50 MCG tablet, Take 50 mcg by mouth. 1 twice a day, Disp: , Rfl:    Allergies  Allergen Reactions  . Shellfish Allergy Swelling    Past Medical History:  Diagnosis Date  . Diabetes mellitus without complication ([place])   . GERD (gastroesophageal reflux disease)   . Hyperlipidemia      Past Surgical History:  Procedure Laterality Date  . boxer fracture ORIF Right 2016  . EYE SURGERY  2007   Strabismus surgery, left eye.  Age 55 yo.    Family History  Problem Relation Age of Onset  . Hypertension Mother     Social History   Tobacco Use  . Smoking status: Current Every Day Smoker    Packs/day: 0.25    Years: 31.00    Pack years: 7.75    Types: Cigarettes  . Smokeless tobacco: Never Used  Vaping Use  . Vaping Use: Never used  Substance Use Topics  . Alcohol use: Yes    Comment: 3 mixed drinks per month  . Drug use: No    ROS   Objective:   Vitals: BP 131/87 (BP Location: Left Arm)   Pulse 93   Temp 98.7 F (37.1 C) (Oral)   Resp 18   SpO2  96%   Physical Exam Constitutional:      General: He is not in acute distress.    Appearance: Normal appearance. He is well-developed and normal weight. He is not ill-appearing, toxic-appearing or diaphoretic.  HENT:     Head: Normocephalic and atraumatic.     Right Ear: External ear normal.     Left Ear: External ear normal.     Nose: Nose normal.     Mouth/Throat:     Pharynx: Oropharynx is clear.  Eyes:     General: No scleral icterus.       Right eye: No discharge.        Left eye: No discharge.     Extraocular Movements: Extraocular movements intact.     Pupils: Pupils are equal, round, and reactive to light.  Cardiovascular:     Rate and Rhythm: Normal rate.  Pulmonary:     Effort: Pulmonary effort is normal.  Musculoskeletal:     Cervical back: Normal range of motion.     Right hip:  Tenderness (over area outlined) and bony tenderness present. No deformity, lacerations or crepitus. Decreased range of motion. Normal strength.       Legs:  Skin:    General: Skin is warm and dry.  Neurological:     Mental Status: He is alert and oriented to person, place, and time.     Motor: No weakness.     Coordination: Coordination normal.     Gait: Gait normal.     Deep Tendon Reflexes: Reflexes normal.  Psychiatric:        Mood and Affect: Mood normal.        Behavior: Behavior normal.        Thought Content: Thought content normal.        Judgment: Judgment normal.     Results for orders placed or performed during the hospital encounter of 01/21/21 (from the past 24 hour(s))  POC CBG monitoring     Status: Abnormal   Collection Time: 01/21/21  6:26 PM  Result Value Ref Range   Glucose-Capillary 347 (H) 70 - 99 mg/dL   DG Hip Unilat W or Wo Pelvis 1 View Right  Result Date: 01/21/2021 CLINICAL DATA:  Right hip pain for 4 days. EXAM: DG HIP (WITH OR WITHOUT PELVIS) 1V RIGHT COMPARISON:  None. FINDINGS: Mild to moderate right hip joint space narrowing. Moderate peripheral acetabular and femoral head neck spurring. Subchondral cystic change and sclerosis. No evidence of fracture, avascular necrosis, or suspicious bone lesion. Left hip osteoarthritis to a lesser extent. Pubic symphysis and sacroiliac joints are congruent. 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 No acute findings. Electronically Signed   By: Keith Rake M.D.   On: 01/21/2021 19:10    Assessment and Plan :   PDMP not reviewed this encounter.  1. Right hip pain   2. Arthritis of right hip     Patient has severely uncontrolled diabetes and therefore will avoid use of prednisone.  Counseled that he needs strict diabetic control and follow-up with his PCP.  In the meantime, use Tylenol and hydrocodone for his severe arthritic pain.  Follow-up with Ortho as soon as possible.  Counseled patient on potential for adverse effects with medications prescribed/recommended today, ER and return-to-clinic precautions discussed, patient verbalized understanding.    Jaynee Eagles, [place] 01/21/21 1953

## 2021-01-22 ENCOUNTER — Telehealth (HOSPITAL_COMMUNITY): Payer: Self-pay

## 2021-01-28 ENCOUNTER — Ambulatory Visit: Payer: Self-pay | Admitting: Internal Medicine

## 2021-12-10 ENCOUNTER — Ambulatory Visit (HOSPITAL_COMMUNITY)
Admission: EM | Admit: 2021-12-10 | Discharge: 2021-12-10 | Disposition: A | Discharge status: Home / Self Care | Payer: Self-pay | Attending: Nurse Practitioner | Admitting: Nurse Practitioner

## 2021-12-10 ENCOUNTER — Encounter (HOSPITAL_COMMUNITY): Payer: Self-pay

## 2021-12-10 ENCOUNTER — Ambulatory Visit (INDEPENDENT_AMBULATORY_CARE_PROVIDER_SITE_OTHER): Payer: Self-pay

## 2021-12-10 DIAGNOSIS — M545 Low back pain, unspecified: Secondary | ICD-10-CM

## 2021-12-10 DIAGNOSIS — G8929 Other chronic pain: Secondary | ICD-10-CM

## 2021-12-10 MED ORDER — CYCLOBENZAPRINE HCL 10 MG PO TABS: 10.0000 mg | ORAL_TABLET | Freq: Every evening | ORAL | 0 refills | Status: DC | PRN

## 2021-12-10 NOTE — ED Provider Notes (Signed)
?[facility] ? ? ? ?CSN: [id number] ?Arrival date & time: 12/10/21  1305 ? ? ?  ? ?History   ?Chief Complaint ?Chief Complaint  ?Patient presents with  ? Back Pain  ? ? ?HPI ?Ryan Hensley is a 51 y.o. male.  ? ?Patient presents for back pain that started gradually years ago.  Denies recent accident, trauma, or injury to his back.  Reports the pain is severe and describes the pain as sharp.  Reports the pain does radiate to the right hip.  Denies any radiation down his leg.  Has used ice/heat, Tylenol, ibuprofen, and topical gel, for the pain without relief.  Denies numbness or tingling in his toes, saddle anesthesia, bowel/bladder incontinence, fevers, nausea/vomiting, and dysuria/urinary frequency.     ? ?Patient reports he recently got insurance and is planning to get back in with his primary care provider to resume diabetes medications.  He would really like to get his back "figured out" because he wants to work a stable job without having to miss work. ? ? ?Past Medical History:  ?Diagnosis Date  ? Diabetes mellitus without complication ([place])   ? GERD (gastroesophageal reflux disease)   ? Hyperlipidemia   ? ? ?Patient Active Problem List  ? Diagnosis Date Noted  ? Cutaneous abscess of back (any part, except buttock) 12/19/2018  ? Gastroesophageal reflux disease 12/13/2018  ? Mixed hyperlipidemia 12/13/2018  ? Diabetic peripheral neuropathy ([place]) 12/13/2018  ? Diabetic retinopathy associated with type 2 diabetes mellitus ([place]) 12/13/2018  ? Type 2 diabetes mellitus with complication, without long-term current use of insulin ([place]) 12/13/2018  ? ? ?Past Surgical History:  ?Procedure Laterality Date  ? boxer fracture ORIF Right 2016  ? EYE SURGERY  2007  ? Strabismus surgery, left eye.  Age 33 yo.  ? ? ? ? ? ?Home Medications   ? ?Prior to Admission medications   ?Medication Sig Start Date End Date Taking? Authorizing Provider  ?AgaMatrix Ultra-Thin Lancets MISC Check blood glucose twice daily before meals  12/13/18   Mack Hook, MD  ?aspirin EC 81 MG tablet 1 tab by mouth daily with morning meal 12/13/18   Mack Hook, MD  ?atorvastatin (LIPITOR) 80 MG tablet 1 tab by mouth daily with evening meal 05/16/19   Mack Hook, MD  ?Blood Glucose Monitoring Suppl (AGAMATRIX PRESTO) w/Device KIT Check blood sugars twice daily before meals 12/13/18   Mack Hook, MD  ?CINNAMON PO Take 1,000 mg by mouth. 1 twice a day    [provider]  ?cyclobenzaprine (FLEXERIL) 10 MG tablet Take 1 tablet (10 mg total) by mouth at bedtime as needed for muscle spasms (back pain). 1/2 to 1 tab by mouth at bedtime for back pain 12/10/21   Eulogio Bear, NP  ?famotidine (PEPCID) 20 MG tablet 2 tabs by mouth at bedtime 12/13/18   Mack Hook, MD  ?glipiZIDE (GLUCOTROL) 10 MG tablet Take 1 tablet (10 mg total) by mouth 2 (two) times daily before a meal. 12/13/18   Mack Hook, MD  ?glucose blood (AGAMATRIX PRESTO TEST) test strip Check blood blood sugar twice daily before meals 12/13/18   Mack Hook, MD  ?HYDROcodone-acetaminophen (NORCO/VICODIN) 5-325 MG tablet Take 1 tablet by mouth every 6 (six) hours as needed for severe pain. 01/21/21   Jaynee Eagles, PA-C  ?Insulin Glargine (LANTUS SOLOSTAR) 100 UNIT/ML Solostar Pen 15 units injected subcutaneously daily in the morning before breakfast 05/16/19   Mack Hook, MD  ?metFORMIN (GLUCOPHAGE) 1000 MG tablet Take 1  tablet (1,000 mg total) by mouth 2 (two) times daily with a meal. 12/19/18   Mack Hook, MD  ?naproxen (NAPROSYN) 500 MG tablet Take 1 tablet (500 mg total) by mouth 2 (two) times daily with a meal. 01/24/19   Mack Hook, MD  ?vitamin B-12 (CYANOCOBALAMIN) 50 MCG tablet Take 50 mcg by mouth. 1 twice a day    [provider]  ? ? ?Family History ?Family History  ?Problem Relation Age of Onset  ? Hypertension Mother   ? ? ?Social History ?Social History  ? ?Tobacco Use  ? Smoking status: Every Day  ?   Packs/day: 0.25  ?  Years: 31.00  ?  Pack years: 7.75  ?  Types: Cigarettes  ? Smokeless tobacco: Never  ?Vaping Use  ? Vaping Use: Never used  ?Substance Use Topics  ? Alcohol use: Yes  ?  Comment: 3 mixed drinks per month  ? Drug use: No  ? ? ? ?Allergies   ?Shellfish allergy ? ? ?Review of Systems ?Review of Systems ?Per HPI ? ?Physical Exam ?Triage Vital Signs ?ED Triage Vitals  ?Enc Vitals Group  ?   BP 12/10/21 1415 126/84  ?   Pulse Rate 12/10/21 1415 83  ?   Resp 12/10/21 1415 18  ?   Temp 12/10/21 1415 98.6 ?F (37 ?C)  ?   Temp Source 12/10/21 1415 Oral  ?   SpO2 12/10/21 1415 98 %  ?   Weight --   ?   Height --   ?   Head Circumference --   ?   Peak Flow --   ?   Pain Score 12/10/21 1413 10  ?   Pain Loc --   ?   Pain Edu? --   ?   Excl. in [place]? --   ? ?No data found. ? ?Updated Vital Signs ?BP 126/84 (BP Location: Right Arm)   Pulse 83   Temp 98.6 ?F (37 ?C) (Oral)   Resp 18   SpO2 98%  ? ?Visual Acuity ?Right Eye Distance:   ?Left Eye Distance:   ?Bilateral Distance:   ? ?Right Eye Near:   ?Left Eye Near:    ?Bilateral Near:    ? ?Physical Exam ?Vitals and nursing note reviewed.  ?Constitutional:   ?   General: He is not in acute distress. ?   Appearance: Normal appearance. He is not toxic-appearing.  ?HENT:  ?   Head: Normocephalic and atraumatic.  ?   Mouth/Throat:  ?   Mouth: Mucous membranes are moist.  ?   Pharynx: Oropharynx is clear.  ?Eyes:  ?   General: No scleral icterus. ?   Extraocular Movements: Extraocular movements intact.  ?Pulmonary:  ?   Effort: Pulmonary effort is normal. No respiratory distress.  ?Musculoskeletal:     ?   General: Normal range of motion.  ?   Lumbar back: Tenderness present. No swelling, edema, deformity or spasms.  ?     Back: ? ?   Comments: TTP in the areas marked; no erythema, warmth, edema, obvious deformity, skin changes  ?Skin: ?   General: Skin is warm and dry.  ?   Capillary Refill: Capillary refill takes less than 2 seconds.  ?   Coloration: Skin is not  jaundiced or pale.  ?   Findings: No erythema.  ?Neurological:  ?   Mental Status: He is alert and oriented to person, place, and time.  ?   Motor: No weakness.  ?  Gait: Gait normal.  ?Psychiatric:     ?   Behavior: Behavior is cooperative.  ? ? ? ?UC Treatments / Results  ?Labs ?(all labs ordered are listed, but only abnormal results are displayed) ?Labs Reviewed - No data to display ? ?EKG ? ? ?Radiology ?DG Lumbar Spine Complete ? ?Result Date: 12/10/2021 ?CLINICAL DATA:  Chronic low back pain EXAM: LUMBAR SPINE - COMPLETE 4+ VIEW COMPARISON:  None. FINDINGS: Normal lumbar lordosis. No acute fracture or listhesis of the lumbar spine. Vertebral body height is preserved. There is intervertebral disc space narrowing and endplate remodeling at M6-K8 in keeping with changes of advanced degenerative disc disease at this level. Remaining intervertebral disc heights are preserved. Oblique views demonstrate no evidence of pars defect. Paraspinal soft tissues are unremarkable. IMPRESSION: Advanced degenerative disc disease L5-S1. Electronically Signed   By: Fidela Salisbury M.D.   On: 12/10/2021 15:00   ? ?Procedures ?Procedures (including critical care time) ? ?Medications Ordered in UC ?Medications - No data to display ? ?Initial Impression / Assessment and Plan / UC Course  ?I have reviewed the triage vital signs and the nursing notes. ? ?Pertinent labs & imaging results that were available during my care of the patient were reviewed by me and considered in my medical decision making (see chart for details). ? ?  ?Lumbar x-ray today shows advanced degenerative disc disease of lumbar spine.  No red flag signs or symptoms today.  Encouraged follow-up with sports medicine/primary care for ongoing management of severe arthritis/back pain.  Continue alternating Tylenol and ibuprofen during the day.  At nighttime, can use cyclobenzaprine 5 to 10 mg as needed for pain. The patient was given the opportunity to ask questions.   All questions answered to their satisfaction.  The patient is in agreement to this plan.  ? ?Final Clinical Impressions(s) / UC Diagnoses  ? ?Final diagnoses:  ?Acute bilateral low back pain without sciati

## 2021-12-10 NOTE — Discharge Instructions (Addendum)
-   X-ray of lumbar spine today shows arthritis of your lower spine; please follow up with the Sports Medicine; contact information is below ?- You can use cyclobenzaprine 5-10 mg at bed time as needed for back pain ?- Please continue use of Tylenol/ibuprofen for pain ?- Call your PCP office to get back on your medicine for diabetes ?

## 2021-12-10 NOTE — ED Triage Notes (Signed)
Pt presents today w/ a long hx of low back and right hip pain. Pt reports that he was referred to a specialist but was unable to follow up due to insurance. Pt now has coverage and would like his sxs to be re-evaluated as they have progressively worsened and otc meds aren't alleviating sxs. ?

## 2022-01-11 ENCOUNTER — Other Ambulatory Visit: Payer: Self-pay

## 2022-01-11 ENCOUNTER — Encounter (HOSPITAL_COMMUNITY): Payer: Self-pay | Admitting: Emergency Medicine

## 2022-01-11 ENCOUNTER — Emergency Department (HOSPITAL_COMMUNITY)
Admission: EM | Admit: 2022-01-11 | Discharge: 2022-01-11 | Disposition: A | Discharge status: Home / Self Care | Payer: Self-pay | Attending: Emergency Medicine | Admitting: Emergency Medicine

## 2022-01-11 DIAGNOSIS — Z760 Encounter for issue of repeat prescription: Secondary | ICD-10-CM | POA: Insufficient documentation

## 2022-01-11 DIAGNOSIS — E1165 Type 2 diabetes mellitus with hyperglycemia: Secondary | ICD-10-CM | POA: Insufficient documentation

## 2022-01-11 DIAGNOSIS — Z7982 Long term (current) use of aspirin: Secondary | ICD-10-CM | POA: Insufficient documentation

## 2022-01-11 DIAGNOSIS — R112 Nausea with vomiting, unspecified: Secondary | ICD-10-CM

## 2022-01-11 DIAGNOSIS — R739 Hyperglycemia, unspecified: Secondary | ICD-10-CM

## 2022-01-11 DIAGNOSIS — R519 Headache, unspecified: Secondary | ICD-10-CM | POA: Insufficient documentation

## 2022-01-11 DIAGNOSIS — Z20822 Contact with and (suspected) exposure to covid-19: Secondary | ICD-10-CM | POA: Insufficient documentation

## 2022-01-11 DIAGNOSIS — Z7984 Long term (current) use of oral hypoglycemic drugs: Secondary | ICD-10-CM | POA: Insufficient documentation

## 2022-01-11 DIAGNOSIS — Z794 Long term (current) use of insulin: Secondary | ICD-10-CM | POA: Insufficient documentation

## 2022-01-11 DIAGNOSIS — R059 Cough, unspecified: Secondary | ICD-10-CM | POA: Insufficient documentation

## 2022-01-11 LAB — COMPREHENSIVE METABOLIC PANEL
ALT: 16 U/L (ref 0–44)
AST: 16 U/L (ref 15–41)
Albumin: 3.5 g/dL (ref 3.5–5.0)
Alkaline Phosphatase: 104 U/L (ref 38–126)
Anion gap: 12 (ref 5–15)
BUN: 10 mg/dL (ref 6–20)
CO2: 21 mmol/L — ABNORMAL LOW (ref 22–32)
Calcium: 9.6 mg/dL (ref 8.9–10.3)
Chloride: 103 mmol/L (ref 98–111)
Creatinine, Ser: 0.99 mg/dL (ref 0.61–1.24)
GFR, Estimated: >60 mL/min (ref >60)
Glucose, Bld: 316 mg/dL — ABNORMAL HIGH (ref 70–99)
Potassium: 3.9 mmol/L (ref 3.5–5.1)
Sodium: 136 mmol/L (ref 135–145)
Total Bilirubin: 0.9 mg/dL (ref 0.3–1.2)
Total Protein: 7 g/dL (ref 6.5–8.1)

## 2022-01-11 LAB — CBC WITH DIFFERENTIAL/PLATELET
Abs Immature Granulocytes: 0.04 10*3/uL (ref 0.00–0.07)
Basophils Absolute: 0 10*3/uL (ref 0.0–0.1)
Basophils Relative: 0 %
Eosinophils Absolute: 0.1 10*3/uL (ref 0.0–0.5)
Eosinophils Relative: 1 %
HCT: 48.4 % (ref 39.0–52.0)
Hemoglobin: 15.7 g/dL (ref 13.0–17.0)
Immature Granulocytes: 0 %
Lymphocytes Relative: 25 %
Lymphs Abs: 3.1 10*3/uL (ref 0.7–4.0)
MCH: 28.3 pg (ref 26.0–34.0)
MCHC: 32.4 g/dL (ref 30.0–36.0)
MCV: 87.2 fL (ref 80.0–100.0)
Monocytes Absolute: 0.8 10*3/uL (ref 0.1–1.0)
Monocytes Relative: 6 %
Neutro Abs: 8.3 10*3/uL — ABNORMAL HIGH (ref 1.7–7.7)
Neutrophils Relative %: 68 %
Platelets: 239 10*3/uL (ref 150–400)
RBC: 5.55 MIL/uL (ref 4.22–5.81)
RDW: 13.7 % (ref 11.5–15.5)
WBC: 12.3 10*3/uL — ABNORMAL HIGH (ref 4.0–10.5)
nRBC: 0 % (ref 0.0–0.2)

## 2022-01-11 LAB — BETA-HYDROXYBUTYRIC ACID: Beta-Hydroxybutyric Acid: 1.98 mmol/L — ABNORMAL HIGH (ref 0.05–0.27)

## 2022-01-11 LAB — CBG MONITORING, ED
Glucose-Capillary: 290 mg/dL — ABNORMAL HIGH (ref 70–99)
Glucose-Capillary: 248 mg/dL — ABNORMAL HIGH (ref 70–99)

## 2022-01-11 LAB — RESP PANEL BY RT-PCR (FLU A&B, COVID) ARPGX2
Influenza A by PCR: NEGATIVE
Influenza B by PCR: NEGATIVE
SARS Coronavirus 2 by RT PCR: NEGATIVE

## 2022-01-11 MED ORDER — INSULIN ASPART 100 UNIT/ML IJ SOLN
12.0000 [IU] | Freq: Once | INTRAMUSCULAR | Status: AC
  Administered 2022-01-11: 12 [IU] via SUBCUTANEOUS

## 2022-01-11 MED ORDER — PANTOPRAZOLE SODIUM 40 MG IV SOLR
40.0000 mg | Freq: Once | INTRAVENOUS | Status: AC
  Administered 2022-01-11: 40 mg via INTRAVENOUS
  Filled 2022-01-11: qty 10

## 2022-01-11 MED ORDER — METFORMIN HCL 1000 MG PO TABS: 1000.0000 mg | ORAL_TABLET | Freq: Two times a day (BID) | ORAL | 3 refills | Status: DC

## 2022-01-11 MED ORDER — ONDANSETRON HCL 4 MG/2ML IJ SOLN
4.0000 mg | Freq: Once | INTRAMUSCULAR | Status: AC
  Administered 2022-01-11: 4 mg via INTRAVENOUS
  Filled 2022-01-11: qty 2

## 2022-01-11 MED ORDER — SODIUM CHLORIDE 0.9 % IV BOLUS
1000.0000 mL | Freq: Once | INTRAVENOUS | Status: AC
  Administered 2022-01-11: 1000 mL via INTRAVENOUS

## 2022-01-11 MED ORDER — LANTUS SOLOSTAR 100 UNIT/ML ~~LOC~~ SOPN: PEN_INJECTOR | SUBCUTANEOUS | 0 refills | Status: DC

## 2022-01-11 MED ORDER — ACETAMINOPHEN 500 MG PO TABS
1000.0000 mg | ORAL_TABLET | Freq: Once | ORAL | Status: AC
  Administered 2022-01-11: 1000 mg via ORAL
  Filled 2022-01-11: qty 2

## 2022-01-11 MED ORDER — PROCHLORPERAZINE EDISYLATE 10 MG/2ML IJ SOLN
10.0000 mg | Freq: Once | INTRAMUSCULAR | Status: AC
  Administered 2022-01-11: 10 mg via INTRAVENOUS
  Filled 2022-01-11: qty 2

## 2022-01-11 MED ORDER — GLIPIZIDE 5 MG PO TABS: 5.0000 mg | ORAL_TABLET | Freq: Two times a day (BID) | ORAL | 0 refills | Status: DC

## 2022-01-11 NOTE — ED Triage Notes (Signed)
C/o headache, nausea, vomiting, and body aches since yesterday.  Out of diabetes medication.  Denies diarrhea.

## 2022-01-11 NOTE — Discharge Instructions (Signed)
Check your blood sugar 3 times a day.  Follow-up with your primary care doctor within 1 week.  Return back to the ER if your blood sugar continues to be high, if you have fevers pain worsening symptoms or any additional concerns return immediately back to the ER.

## 2022-01-11 NOTE — ED Provider Notes (Signed)
[facility] EMERGENCY DEPARTMENT Provider Note   CSN: [id number] Arrival date & time: 01/11/22  1228     History  Chief Complaint  Patient presents with   Headache   Vomiting    Ryan Hensley is a 51 y.o. male.  Patient presents with chief complaint of cough congestion and headache.  Associate with vomiting.  He states he checks his blood sugar once every few days and saw that it was high.  He had run out of his diabetes medications over a year ago.  He is scheduled to see his doctor next week but due to the vomiting and headache presented to the ER.  Denies fevers denies cough denies diarrhea.      Home Medications Prior to Admission medications   Medication Sig Start Date End Date Taking? Authorizing Provider  AgaMatrix Ultra-Thin Lancets MISC Check blood glucose twice daily before meals 12/13/18   Mack Hook, MD  aspirin EC 81 MG tablet 1 tab by mouth daily with morning meal 12/13/18   Mack Hook, MD  atorvastatin (LIPITOR) 80 MG tablet 1 tab by mouth daily with evening meal 05/16/19   Mack Hook, MD  Blood Glucose Monitoring Suppl (AGAMATRIX PRESTO) w/Device KIT Check blood sugars twice daily before meals 12/13/18   Mack Hook, MD  CINNAMON PO Take 1,000 mg by mouth. 1 twice a day    [provider]  cyclobenzaprine (FLEXERIL) 10 MG tablet Take 1 tablet (10 mg total) by mouth at bedtime as needed for muscle spasms (back pain). 1/2 to 1 tab by mouth at bedtime for back pain 12/10/21   Eulogio Bear, NP  famotidine (PEPCID) 20 MG tablet 2 tabs by mouth at bedtime 12/13/18   Mack Hook, MD  glipiZIDE (GLUCOTROL) 5 MG tablet Take 1 tablet (5 mg total) by mouth 2 (two) times daily before a meal. 01/11/22 02/10/22  Luna Fuse, MD  glucose blood (AGAMATRIX PRESTO TEST) test strip Check blood blood sugar twice daily before meals 12/13/18   Mack Hook, MD  HYDROcodone-acetaminophen (NORCO/VICODIN) 5-325 MG  tablet Take 1 tablet by mouth every 6 (six) hours as needed for severe pain. 01/21/21   Jaynee Eagles, PA-C  insulin glargine (LANTUS SOLOSTAR) 100 UNIT/ML Solostar Pen 15 units injected subcutaneously daily in the morning before breakfast 01/11/22   Luna Fuse, MD  metFORMIN (GLUCOPHAGE) 1000 MG tablet Take 1 tablet (1,000 mg total) by mouth 2 (two) times daily with a meal. 01/11/22   Luna Fuse, MD  naproxen (NAPROSYN) 500 MG tablet Take 1 tablet (500 mg total) by mouth 2 (two) times daily with a meal. 01/24/19   Mack Hook, MD  vitamin B-12 (CYANOCOBALAMIN) 50 MCG tablet Take 50 mcg by mouth. 1 twice a day    [provider]      Allergies    Shellfish allergy    Review of Systems   Review of Systems  Constitutional:  Negative for fever.  HENT:  Negative for ear pain and sore throat.   Eyes:  Negative for pain.  Respiratory:  Negative for cough.   Cardiovascular:  Negative for chest pain.  Gastrointestinal:  Negative for abdominal pain.  Genitourinary:  Negative for flank pain.  Musculoskeletal:  Negative for back pain.  Skin:  Negative for color change and rash.  Neurological:  Negative for syncope.  All other systems reviewed and are negative.  Physical Exam Updated Vital Signs BP 127/87   Pulse 68   Temp 98.7 F (  37.1 C) (Oral)   Resp 19   SpO2 98%  Physical Exam Constitutional:      Appearance: He is well-developed.  HENT:     Head: Normocephalic.     Nose: Nose normal.  Eyes:     Extraocular Movements: Extraocular movements intact.  Cardiovascular:     Rate and Rhythm: Normal rate.  Pulmonary:     Effort: Pulmonary effort is normal.  Skin:    Coloration: Skin is not jaundiced.  Neurological:     General: No focal deficit present.     Mental Status: He is alert and oriented to person, place, and time. Mental status is at baseline.     Cranial Nerves: No cranial nerve deficit.     Motor: No weakness.    ED Results / Procedures / Treatments    Labs (all labs ordered are listed, but only abnormal results are displayed) Labs Reviewed  CBC WITH DIFFERENTIAL/PLATELET - Abnormal; Notable for the following components:      Result Value   WBC 12.3 (*)    Neutro Abs 8.3 (*)    All other components within normal limits  COMPREHENSIVE METABOLIC PANEL - Abnormal; Notable for the following components:   CO2 21 (*)    Glucose, Bld 316 (*)    All other components within normal limits  BETA-HYDROXYBUTYRIC ACID - Abnormal; Notable for the following components:   Beta-Hydroxybutyric Acid 1.98 (*)    All other components within normal limits  CBG MONITORING, ED - Abnormal; Notable for the following components:   Glucose-Capillary 290 (*)    All other components within normal limits  CBG MONITORING, ED - Abnormal; Notable for the following components:   Glucose-Capillary 248 (*)    All other components within normal limits  RESP PANEL BY RT-PCR (FLU A&B, COVID) ARPGX2    EKG None  Radiology No results found.  Procedures Procedures    Medications Ordered in ED Medications  sodium chloride 0.9 % bolus 1,000 mL (0 mLs Intravenous Stopped 01/11/22 1443)  ondansetron (ZOFRAN) injection 4 mg (4 mg Intravenous Given 01/11/22 1310)  pantoprazole (PROTONIX) injection 40 mg (40 mg Intravenous Given 01/11/22 1318)  prochlorperazine (COMPAZINE) injection 10 mg (10 mg Intravenous Given 01/11/22 1312)  acetaminophen (TYLENOL) tablet 1,000 mg (1,000 mg Oral Given 01/11/22 1312)  insulin aspart (novoLOG) injection 12 Units (12 Units Subcutaneous Given 01/11/22 1445)    ED Course/ Medical Decision Making/ A&P                           Medical Decision Making Amount and/or Complexity of Data Reviewed Labs: ordered.  Risk OTC drugs. Prescription drug management.   Chart review shows ER visit in December 04, 2021 for back pain.  Cardiac monitoring showing sinus rhythm.  Data studies were sent glucose elevated but no evidence of DKA.  Patient  given IV fluids and insulin with subsequent improvement of blood sugar.  Repeat prescription sent to his pharmacy although he does appear to have refills left from his prior prescriptions.  Patient advised to fill his medications at the pharmacy today.  Advised to keep his appointment with his doctor next week.  Advised immediate return for worsening symptoms fevers pain or any additional concerns.        Final Clinical Impression(s) / ED Diagnoses Final diagnoses:  Nausea and vomiting, unspecified vomiting type  Hyperglycemia    Rx / DC Orders ED Discharge Orders  Ordered    glipiZIDE (GLUCOTROL) 5 MG tablet  2 times daily before meals        01/11/22 1506    insulin glargine (LANTUS SOLOSTAR) 100 UNIT/ML Solostar Pen        01/11/22 1506    metFORMIN (GLUCOPHAGE) 1000 MG tablet  2 times daily with meals       Note to Pharmacy: Cancel 500 mg prescription   01/11/22 1506              Luna Fuse, MD 01/11/22 1542

## 2022-01-11 NOTE — ED Notes (Signed)
C/o vomiting, headache, since yesterday-- states vomit is red in color, "I ate hot sauce Friday- but it is still red today"  I also have back pain,. "Arthritis and bulging discs in back"   "I have a stomach situation, only have a BM every 2-3 weeks. If I take a laxative it works"

## 2022-01-14 ENCOUNTER — Ambulatory Visit: Payer: Self-pay | Admitting: Internal Medicine

## 2022-01-14 ENCOUNTER — Encounter: Payer: Self-pay | Admitting: Internal Medicine

## 2022-01-14 VITALS — BP 92/60 | HR 72 | Resp 16 | Ht 67.5 in | Wt 168.0 lb

## 2022-01-14 DIAGNOSIS — K219 Gastro-esophageal reflux disease without esophagitis: Secondary | ICD-10-CM

## 2022-01-14 DIAGNOSIS — E118 Type 2 diabetes mellitus with unspecified complications: Secondary | ICD-10-CM

## 2022-01-14 DIAGNOSIS — Z5941 Food insecurity: Secondary | ICD-10-CM

## 2022-01-14 DIAGNOSIS — E782 Mixed hyperlipidemia: Secondary | ICD-10-CM

## 2022-01-14 MED ORDER — ATORVASTATIN CALCIUM 80 MG PO TABS: ORAL_TABLET | ORAL | 11 refills | Status: DC

## 2022-01-14 MED ORDER — METFORMIN HCL 1000 MG PO TABS: 1000.0000 mg | ORAL_TABLET | Freq: Two times a day (BID) | ORAL | 11 refills | Status: DC

## 2022-01-14 MED ORDER — AGAMATRIX PRESTO TEST VI STRP
ORAL_STRIP | 11 refills | Status: DC
Start: 2022-01-14 — End: 2023-12-15

## 2022-01-14 MED ORDER — AGAMATRIX ULTRA-THIN LANCETS MISC: 11 refills | Status: DC

## 2022-01-14 MED ORDER — FAMOTIDINE 20 MG PO TABS
ORAL_TABLET | ORAL | 11 refills | Status: DC
Start: 2022-01-14 — End: 2023-06-18

## 2022-01-14 MED ORDER — LANTUS SOLOSTAR 100 UNIT/ML ~~LOC~~ SOPN
PEN_INJECTOR | SUBCUTANEOUS | 11 refills | Status: DC
Start: 2022-01-14 — End: 2023-01-29

## 2022-01-14 MED ORDER — AGAMATRIX PRESTO W/DEVICE KIT: PACK | 0 refills | Status: DC

## 2022-01-14 MED ORDER — GLIPIZIDE 5 MG PO TABS: ORAL_TABLET | ORAL | 11 refills | Status: DC

## 2022-01-14 NOTE — Progress Notes (Signed)
    Subjective:    Patient ID: Ryan Hensley, male   DOB: 1971/02/11, 51 y.o.   MRN: [id number]   HPI  Here to re establish after almost 3 years.    Is not taking any meds, though was in the ED recently with [facility].   CBC, Kidney function were fine.  BG was 300+  Stated he had been without meds for over 1 year. States he is unable to work due to ortho issues and brother is currently supporting.    Discussed nutrition/food substitutes with someone who has DM.  Seems to have good knowledge  He is having GERD symptoms regularly and needs something for this.  Chronic back and hip pain:  tried PT in [facility], but just increased pain.  Not working due to the pain and being out.    HM:  states had covid vaccines, but cannot say how many.  Has not had booster in some time.    No outpatient medications have been marked as taking for the 01/14/22 encounter (Office Visit) with Mack Hook, MD.   Allergies  Allergen Reactions   Shellfish Allergy Swelling     Review of Systems    Objective:   BP 92/60 (BP Location: Right Arm, Patient Position: Sitting, Cuff Size: Normal)   Pulse 72   Resp 16   Ht 5' 7.5" (1.715 m)   Wt 168 lb (76.2 kg)   BMI 25.92 kg/m   Physical Exam NAD HEENT:  PERRL, EOMI Lungs:  CTA CV:  RRR with normal S1 andS2, No S3, S4 or murmur.  Radial and DP pulses normal and equal.   Abd:  S, NT, No HSM or mass, + BS LE:   No edema.   Assessment & Plan   DM:  C peptide, A1C for baseline and appropriate meds.  Restart Metformin 1000 mg and Glipizide 5 mg twice daily with meals as well as Lantus 15 units in a.m.  Glucose monitoring equipment.  Long discussion about adequate care for self.   2.  Food insecurity and other needs:  referral to Leeann Must, [place].  Discussed food access every Monday and 2nd Thursday of each month as well as possible [employer] program.     3.  Hyperlipidemia:  restart Atorvastatin.  4.  Back and hip pain:  consider referral to  ortho in 6 weeks with follow up.  Consider adding Gabapentin again.  He cannot say if helped in past for neuropathy.  5.  HM:  will need covid booster at follow up--look into system regarding previous vaccinations.

## 2022-01-15 LAB — HGB A1C W/O EAG: Hgb A1c MFr Bld: 13.3 % — ABNORMAL HIGH (ref 4.8–5.6)

## 2022-01-15 LAB — C-PEPTIDE: C-Peptide: 2.4 ng/mL (ref 1.1–4.4)

## 2022-02-16 ENCOUNTER — Other Ambulatory Visit: Payer: Self-pay

## 2022-02-16 DIAGNOSIS — E782 Mixed hyperlipidemia: Secondary | ICD-10-CM

## 2022-02-16 DIAGNOSIS — Z125 Encounter for screening for malignant neoplasm of prostate: Secondary | ICD-10-CM

## 2022-02-16 DIAGNOSIS — E118 Type 2 diabetes mellitus with unspecified complications: Secondary | ICD-10-CM

## 2022-02-18 LAB — SPECIMEN STATUS

## 2022-02-19 LAB — COMPREHENSIVE METABOLIC PANEL
ALT: 15 IU/L (ref 0–44)
AST: 16 IU/L (ref 0–40)
Albumin/Globulin Ratio: 1.5 (ref 1.2–2.2)
Albumin: 4.1 g/dL (ref 3.8–4.9)
Alkaline Phosphatase: 98 IU/L (ref 44–121)
BUN/Creatinine Ratio: 16 (ref 9–20)
BUN: 16 mg/dL (ref 6–24)
Bilirubin Total: 0.3 mg/dL (ref 0.0–1.2)
CO2: 26 mmol/L (ref 20–29)
Calcium: 9.9 mg/dL (ref 8.7–10.2)
Chloride: 107 mmol/L — ABNORMAL HIGH (ref 96–106)
Creatinine, Ser: 1 mg/dL (ref 0.76–1.27)
Globulin, Total: 2.7 g/dL (ref 1.5–4.5)
Glucose: 68 mg/dL — ABNORMAL LOW (ref 70–99)
Potassium: 4.7 mmol/L (ref 3.5–5.2)
Sodium: 146 mmol/L — ABNORMAL HIGH (ref 134–144)
Total Protein: 6.8 g/dL (ref 6.0–8.5)
eGFR: 91 mL/min/{1.73_m2} (ref >59)

## 2022-02-19 LAB — PSA: Prostate Specific Ag, Serum: 4.9 ng/mL — ABNORMAL HIGH (ref 0.0–4.0)

## 2022-02-19 LAB — LIPID PANEL W/O CHOL/HDL RATIO
Cholesterol, Total: 180 mg/dL (ref 100–199)
HDL: 62 mg/dL (ref >39)
LDL Chol Calc (NIH): 102 mg/dL — ABNORMAL HIGH (ref 0–99)
Triglycerides: 86 mg/dL (ref 0–149)
VLDL Cholesterol Cal: 16 mg/dL (ref 5–40)

## 2022-02-19 LAB — MICROALBUMIN / CREATININE URINE RATIO
Creatinine, Urine: 165 mg/dL
Microalb/Creat Ratio: 27 mg/g creat (ref 0–29)
Microalbumin, Urine: 44.4 ug/mL

## 2022-02-23 ENCOUNTER — Other Ambulatory Visit: Payer: Self-pay

## 2022-02-25 ENCOUNTER — Ambulatory Visit: Payer: Self-pay | Admitting: Internal Medicine

## 2022-02-25 ENCOUNTER — Encounter: Payer: Self-pay | Admitting: Internal Medicine

## 2022-02-25 VITALS — BP 100/62 | HR 74 | Resp 12 | Ht 67.5 in | Wt 172.5 lb

## 2022-02-25 DIAGNOSIS — E118 Type 2 diabetes mellitus with unspecified complications: Secondary | ICD-10-CM

## 2022-02-25 DIAGNOSIS — E782 Mixed hyperlipidemia: Secondary | ICD-10-CM

## 2022-02-25 DIAGNOSIS — K0889 Other specified disorders of teeth and supporting structures: Secondary | ICD-10-CM

## 2022-02-25 DIAGNOSIS — K219 Gastro-esophageal reflux disease without esophagitis: Secondary | ICD-10-CM

## 2022-02-25 DIAGNOSIS — M5416 Radiculopathy, lumbar region: Secondary | ICD-10-CM

## 2022-02-25 DIAGNOSIS — Z5941 Food insecurity: Secondary | ICD-10-CM | POA: Insufficient documentation

## 2022-02-25 MED ORDER — GABAPENTIN 100 MG PO CAPS: ORAL_CAPSULE | ORAL | 11 refills | Status: DC

## 2022-02-25 MED ORDER — METOCLOPRAMIDE HCL 5 MG PO TABS: ORAL_TABLET | ORAL | 11 refills | Status: DC

## 2022-02-25 MED ORDER — TAMSULOSIN HCL 0.4 MG PO CAPS: ORAL_CAPSULE | ORAL | 11 refills | Status: DC

## 2022-02-25 MED ORDER — FINASTERIDE 5 MG PO TABS: 5.0000 mg | ORAL_TABLET | Freq: Every day | ORAL | 11 refills | Status: DC

## 2022-02-25 NOTE — Progress Notes (Signed)
Subjective:    Patient ID: Ryan Hensley, male   DOB: Jul 04, 1971, 51 y.o.   MRN: [id number]   HPI   DM:  C peptide was mid normal range.  Discussed oral meds, therefore, are good for him to utilize.  He is also using Lantus 15 units daily.  He is not checking sugars--states tearing up fingers.  Only checked in morning when did check--was last week and measured 150.    Sugar recently fasting was 68.  All bottles dated  01/20/2022, so either missing meds or did not pick up for some time after seen on 01/14/22.  Ultimately, admits he is only eating once daily and then skips meds.  Has not had diabetic eye check  2.  Hyperlipidemia:  Not taking Atorvastatin as could not afford at [facility].  Will need to borrow money.  Is not working and has been denied Disability.    3.  Back and Hip pain:  states cannot lift and cannot work.  Has been turned down 4 times for disability in [place].  Denied 3 times in [place].  Has attorney, Renford Dills currently representing him.  He does not believe he has worked with [employer].  Lives with his brother.  Brother covering all bills.   See Xrays from [place] dated 06/04/21 of L/S spine with spurring and disc narrowing at L5-S1 and spurring of acetabulum and subchondral cyst formation of hips bilaterally.    4.  Elevated PSA:  Recently measured mildly elevated at 4.9%.  He is not aware of a family history of prostate cancer.  Does have problems with decreased stream and hesitancy.  Has to strain to get urine stream started.  Also has incomplete emptying and strains again to completely empty.  No nocturia, but cuts off liquids early in evening to prevent need to urinate at night.  Has been an issue more so in past year, but has been limiting liquids for a years.    5.  GERD:  Feels food is stuck in his esophagus and has a bubble above.  Belches.  Taking Zantac multiple times beyond his Famotidine.  Has had problems with this before moving to [place] in 2017.  6.  Bottom right and top left teeth  with pain.  May have swelling of gum around them.  The teeth are very loose.  Current Meds  Medication Sig   acetaminophen (TYLENOL) 500 MG tablet Take 500 mg by mouth every 6 (six) hours as needed. Takes 2 tablet once a day   AgaMatrix Ultra-Thin Lancets MISC Check blood glucose twice daily before meals   Blood Glucose Monitoring Suppl (AGAMATRIX PRESTO) w/Device KIT Check blood sugars twice daily before meals   famotidine (PEPCID) 20 MG tablet 2 tabs by mouth at bedtime   glipiZIDE (GLUCOTROL) 5 MG tablet 1 tab by mouth twice daily with meals   glucose blood (AGAMATRIX PRESTO TEST) test strip Check blood blood sugar twice daily before meals   insulin glargine (LANTUS SOLOSTAR) 100 UNIT/ML Solostar Pen 15 units injected subcutaneously daily in the morning before breakfast   metFORMIN (GLUCOPHAGE) 1000 MG tablet Take 1 tablet (1,000 mg total) by mouth 2 (two) times daily with a meal.   Allergies  Allergen Reactions   Shellfish Allergy Swelling     Review of Systems    Objective:   BP 100/62 (BP Location: Left Arm, Patient Position: Sitting, Cuff Size: Normal)   Pulse 74   Resp 12   Ht 5' 7.5" (1.715 m)  Wt 172 lb 8 oz (78.2 kg)   BMI 26.62 kg/m   Physical Exam NAD HEENT:  PERRL, EOMI, TMs pearly gray.  Throat without injection.  Right lower and left upper posterior molars loose with decay.  No surrounding erythema.  Missing teeth as well. Neck:  Supple, No adenopathy Chest:  CTA CV:  RRR with normal S1 and S2, No S3, S4 or murmur  Assessment & Plan    DM:  Poorly controlled.  Not taking meds regularly.  Encouraged him to get into a routine with eating 3 meals daily and taking his meds with meals.    2.  Hyperlipidemia:   Discussed Atorvastatin is $6 per month.  He is reticent to ask his brother to cover, but encouraged him to do so as needs to get cholesterol down.  3.  Back and hip pain with significant findings on Xray:  referral to [place] with application for  financial assistance with [facility].    4.  Dental decay:  referral to dental clinic.    5.  Elevated PSA:  Start treatment for BPH as he is symptomatic.  Return in next week for total and free PSA with fasting labs.    6.  GERD vs Diabetic gastroparesis or both:  Continue Famotidine.  Start Metoclopramide 5 mg before each meal 3 times daily.  Elevate HOB.  GERD precautions discussed.  7.  Food insecurity:  Leeann Must, [facility] to contact.

## 2022-04-14 ENCOUNTER — Other Ambulatory Visit: Payer: Self-pay

## 2022-05-28 ENCOUNTER — Ambulatory Visit: Payer: Self-pay | Admitting: Internal Medicine

## 2022-07-22 ENCOUNTER — Encounter: Payer: Self-pay | Admitting: Internal Medicine

## 2022-07-22 ENCOUNTER — Ambulatory Visit: Payer: Self-pay | Admitting: Internal Medicine

## 2022-07-22 VITALS — BP 130/84 | HR 88 | Resp 16 | Ht 67.75 in | Wt 176.0 lb

## 2022-07-22 DIAGNOSIS — Z5982 Transportation insecurity: Secondary | ICD-10-CM

## 2022-07-22 DIAGNOSIS — Z5941 Food insecurity: Secondary | ICD-10-CM

## 2022-07-22 DIAGNOSIS — K219 Gastro-esophageal reflux disease without esophagitis: Secondary | ICD-10-CM

## 2022-07-22 DIAGNOSIS — N401 Enlarged prostate with lower urinary tract symptoms: Secondary | ICD-10-CM

## 2022-07-22 DIAGNOSIS — E118 Type 2 diabetes mellitus with unspecified complications: Secondary | ICD-10-CM

## 2022-07-22 DIAGNOSIS — E782 Mixed hyperlipidemia: Secondary | ICD-10-CM

## 2022-07-22 DIAGNOSIS — M5416 Radiculopathy, lumbar region: Secondary | ICD-10-CM

## 2022-07-22 DIAGNOSIS — R3916 Straining to void: Secondary | ICD-10-CM

## 2022-07-22 DIAGNOSIS — R972 Elevated prostate specific antigen [PSA]: Secondary | ICD-10-CM

## 2022-07-22 DIAGNOSIS — Z5986 Financial insecurity: Secondary | ICD-10-CM

## 2022-07-22 MED ORDER — GABAPENTIN 100 MG PO CAPS: ORAL_CAPSULE | ORAL | 11 refills | Status: DC

## 2022-07-22 NOTE — Progress Notes (Signed)
Subjective:    Patient ID: Ryan Hensley, male   DOB: Oct 27, 1970, 51 y.o.   MRN: [id number]   HPI  After below history obtained, found out from pharmacy he has not picked up most of his meds since 04/24/2022.  Generally, due to not having money to pay for meds.  Here after missed follow up for labs and then again for a visit.     DM:  Did not get recent A1C as missed appt.  States checking blood sugars once daily before breakfast.  Sugars ranging first thing in morning from 127 to 206.  Missing meals or not consistent with meals.  Gets shaky after missing and drinks soda or gatorade.  Has not had eye exam--referred in July.  Does have numbness of feet.   2.  Hyperlipidemia:  Was improved in July, despite the fact that he was not taking Atorvastatin due to cost.   Missed follow up labs.  Has been taking Atorvastatin in recent months.  3.  Elevated PSA with BPH symptoms:  Has been taking Tamsulosin and Finasteride.  Still having difficulties pushing urine out.  Nocturia 3-4 times.   Stream is mildly decreased.  Strains to urinate.  Does also still have hesitancy  4.  Dental decay:  has been to dentist--had to pull 2 teeth and feels much better now.    5.  Bilateral low back pain with radiculopathy:  Gabapentin has not really helped.  Taking 600 mg ibuprofen with the gabapentin for the pain.  6.  GERD:  still acting up.  Not clear if not getting Famotidine.  All meds at [facility]  Current Meds  Medication Sig   acetaminophen (TYLENOL) 500 MG tablet Take 500 mg by mouth every 6 (six) hours as needed. Takes 2 tablet once a day   AgaMatrix Ultra-Thin Lancets MISC Check blood glucose twice daily before meals   atorvastatin (LIPITOR) 80 MG tablet 1 tab by mouth daily with evening meal   Blood Glucose Monitoring Suppl (AGAMATRIX PRESTO) w/Device KIT Check blood sugars twice daily before meals   famotidine (PEPCID) 20 MG tablet 2 tabs by mouth at bedtime   finasteride (PROSCAR) 5 MG tablet Take 1  tablet (5 mg total) by mouth daily.   gabapentin (NEURONTIN) 100 MG capsule 1 cap by mouth at bedtime for 3 days then increase to 2 caps at bedtime for 3 days then increase to 3 caps at bedtime and maintain that dose.   glipiZIDE (GLUCOTROL) 5 MG tablet 1 tab by mouth twice daily with meals   glucose blood (AGAMATRIX PRESTO TEST) test strip Check blood blood sugar twice daily before meals   insulin glargine (LANTUS SOLOSTAR) 100 UNIT/ML Solostar Pen 15 units injected subcutaneously daily in the morning before breakfast   metFORMIN (GLUCOPHAGE) 1000 MG tablet Take 1 tablet (1,000 mg total) by mouth 2 (two) times daily with a meal.   metoCLOPramide (REGLAN) 5 MG tablet 1 tab by mouth before meal 3 times daily.   tamsulosin (FLOMAX) 0.4 MG CAPS capsule 1 tab by mouth with evening meal.   vitamin B-12 (CYANOCOBALAMIN) 50 MCG tablet Take 50 mcg by mouth. 1 twice a day   Allergies  Allergen Reactions   Shellfish Allergy Swelling     Review of Systems    Objective:   BP 130/84 (BP Location: Right Arm, Patient Position: Sitting, Cuff Size: Normal)   Pulse 88   Resp 16   Ht 5' 7.75" (1.721 m)   Wt 176 lb (  79.8 kg)   BMI 26.96 kg/m   Physical Exam NAD HEENT:  Right RRL, EOMI, left pupil fixed in dilated irregular state and opacified.   Neck:  supple, No adenopathy Chest:  CTA CV:  RRR without murmur or rub, radial and DP pulses normal and equal Abd:  S, NT, No HSM or mass, + BS LE:  No edema.   Assessment & Plan  [facility] pharmacy and found out he had not filled most of his meds besides Lantus and I believe Metoclopramide since Sept 8th after he swore he has been taking his meds regularly.  Basically, has no income and feels he cannot bother family to cover meds as brother is covering rent and utilities, etc.   He did not follow up with Gala Romney, LCSW-A after she reached out earlier in year when continued to have issues with not affording food, meds.     Low back pain:   will send referral to [place] again.  Addendum:  they called patient and stated he needed $100 copay to be seen and had to apply for financial assistance first, which is not the case with [facility].  Will see how he does with increase in Gabapentin and if gets signed up with Medicaid to go elsewhere.    2.  Peripheral neuropathy of feet:  gabapentin titration.  3.  DM, hyperlipidemia, BPH with mildly elevated PSA, GERD:  not taking meds.  Have discussed in group case management.   Emeterio Reeve Trogdon, LCSW-A will recontact him and see about helping him with Medicaid, though he is to come to Dec 14th informational fair for Medicaid options to decide what works best for him.   Also to return this Friday for influenza and COVID vaccines.   Return in 3 months for fasting labs and then with me 2d later.

## 2022-07-22 NOTE — Progress Notes (Signed)
Patient called to report current medications that he has at home:  Tamsulosin 1 tab (.4mg ) once daily Atorvastatin 1 tab (80mg ) once daily Reglan half tab (2.5mg ) three times daily Glipizide 1 tab (5mg ) twice daily Famotidine 2 tabs (20mg  each tab) at bedtime Metformin 1 tab (1000mg ) twice daily Lantus

## 2022-07-22 NOTE — Patient Instructions (Signed)
Please come to [facility] on Thursday, December 14th at 9 a.m. to talk with folks about which Medicaid plan is best for you and how to sign up.  Please come this Friday, December 8th at 2 p.m. for influenza and [employer] COVID vaccines  Here is how to gradually increase your gabapentin:    Gabapentin:  You should be taking 3 caps at bedtime at this point.  Start another 1 cap in the morning In 3 more days, increase to 2 caps in the morning In 3 more days, increase to 3 caps in the morning:  You should be taking 3 caps in the morning and 3 caps at bedtime at this point.  In 3 days, start another dose midday--1 cap In 3 more days, increase to 2 caps midday In 3 more days, increase to 3 caps midday. You should be taking 3 caps 3 times daily at this point.  Stay on this dose until follow up If you do not tolerate increasing the dose at any point, hold on the dose you tolerate or call clinic if having problems

## 2022-08-20 ENCOUNTER — Telehealth: Payer: Self-pay

## 2022-08-20 NOTE — Telephone Encounter (Signed)
Ryan Reeve Trogdon LCSW-A discussed what was needed in letter to DSS with his caseworker and will have that available today.

## 2022-08-20 NOTE — Telephone Encounter (Signed)
Out of food in home. States he spoke with either Gala Romney, LCSW-A or [place], CHW after last visit and was to follow up with case management (sounds like was being set up for food access, but also needs to sign up for Medicaid.) He needs a letter from me to his [place], Lodema Pilot, [phone] so he can get food stamps again, which now have an associated work requirement and he is not working. Call into Ms. Avon Gully  He did not follow up here with Medicaid sign up as recommended--states he missed. He also is waiting for decision on disability claim.   Discussed with Gala Romney, LCSW-A He was not responding to her after initial contact.   She will take over trying to contact his DSS caseworker as on hold for prolonged time, and let me know if letter helpful for me to help him with food stamp issue. Also, will work with him over next days for food access.

## 2022-08-20 NOTE — Telephone Encounter (Signed)
Patient called wanting a letter stating his inability to work in order to get approved for food stamps.

## 2022-09-17 ENCOUNTER — Inpatient Hospital Stay: Admit: 2022-09-18

## 2022-09-17 ENCOUNTER — Inpatient Hospital Stay
Admit: 2022-09-17 | Discharge: 2022-09-19 | Disposition: A | Discharge status: Home / Self Care | Attending: Infectious Disease | Admitting: Infectious Disease

## 2022-09-17 DIAGNOSIS — I5021 Acute systolic (congestive) heart failure: Secondary | ICD-10-CM

## 2022-09-17 NOTE — Progress Notes (Signed)
Patient transferred here from [facility]. Telemetry box C17 placed on patient. Registration and admitting physician called. Patient alert & oriented and walking around room.

## 2022-09-17 NOTE — H&P (Signed)
Hospitalist Admission Note    NAME: Paul Casey   DOB:  1971/05/20   MRN:  [id number]     Date/Time:  09/17/2022 6:59 PM    Patient PCP: No primary care provider on file.    ______________________________________________________________________  Given the patient's current clinical presentation, I have a high level of concern for decompensation if discharged from the emergency department.  Complex decision making was performed, which includes reviewing the patient's available past medical records, laboratory results, and x-ray films.        My assessment of this patient's clinical condition and my plan of care is as follows.    Paul Casey is a 52 year old male with a PMH of asthma and previous tobacco use who transferred here from [facility] with shortness of breath. Patient states it started 2 weeks ago and gets worse with activity. Associated orthopnea. Patient reports he has not seen a PCP in the last 5 years. Workup in ED revealed elevated BNP, elevated D-dimer and CTA with multichamber dilated cardiomegaly and pulmonary edema, no PE. ECHO with EF 0-10%, severe hypokinesis, mild/moderate aortic valve regurgitation, and mild mitral regurgitation. Patient transferred here for left and right heart cath scheduled for 2/2 with Dr. Antonietta Breach.     Assessment / Plan    Acute systolic heart failure  Negative troponin x 2  Pro BNP 5010  ECHO with EF 0-10% with severe hypokinesis   CTA with pulmonary edema and multichamber dilated cardiomegaly, no PE  Order chest xray  Order EKG  Continue lasix   Continue aspirin  Continue carvedilol  Continue entresto  Regular diet, NPO after midnight   Left and right heart cath scheduled for 2/2 with Dr. Antonietta Breach.    Asthma  Albuterol as needed    History of tobacco use   20 pack year smoker  Stopped in Nov 2023    Hyperlipidemia  LDL 114  Continue atorvastatin     Multivalvular regurgitation  ECHO mild/moderate aortic valve regurgitation, and mild mitral  regurgitation  Follow up with cardiology outpatient for serial echos     Medical Decision Making:   I personally reviewed labs: Creatinine 1.12  I personally reviewed imaging:CXR with pulmonary edema  Toxic drug monitoring: bleeding on lovenox  Discussed case with: ED provider. After discussion I am in agreement that acuity of patient's medical condition necessitates hospital stay.      Code Status: Full code  DVT Prophylaxis: Lovenox  GI Prophylaxis: Not indicated      Subjective:   CHIEF COMPLAINT: Shortness of breath     HISTORY OF PRESENT ILLNESS:     Paul Casey is a 52 y.o.  male  with a PMH of asthma and previous tobacco use who transferred here from [facility] with shortness of breath. Patient states it started 2 weeks ago and gets worse with activity. Associated orthopnea. Patient reports he has not seen a PCP in the last 5 years. Workup in ED revealed elevated BNP, elevated D-dimer and CTA with multichamber dilated cardiomegaly and pulmonary edema, no PE. ECHO with EF 0-10%, severe hypokinesis, mild/moderate aortic valve regurgitation, and mild mitral regurgitation. Patient transferred here for left and right heart cath scheduled for 2/2 with Dr. Antonietta Breach.   Past Medical History:   Diagnosis Date    Asthma         History reviewed. No pertinent surgical history.    Social History     Tobacco Use    Smoking status: Former  Current packs/day: 0.00     Average packs/day: 1 pack/day for 19.9 years (19.9 ttl pk-yrs)     Types: Cigarettes     Start date: 2004     Quit date: 07/11/2022     Years since quitting: 0.1    Smokeless tobacco: Never   Substance Use Topics    Alcohol use: Not Currently        Family History   Problem Relation Age of Onset    Cirrhosis Mother     Diabetes Mother     Heart Attack Father     No Known Problems Sister     No Known Problems Brother      No Known Allergies     Prior to Admission medications    Not on File         Objective:   VITALS:    Patient Vitals for the past 24  hrs:   BP Temp Temp src Pulse Resp SpO2 Height Weight   09/17/22 1727 108/79 98.1 F (36.7 C) Oral (!) 108 20 98 % 1.702 m (5\' 7" ) 83.9 kg (185 lb)       Temp (24hrs), Avg:98.1 F (36.7 C), Min:98.1 F (36.7 C), Max:98.1 F (36.7 C)           Wt Readings from Last 12 Encounters:   09/17/22 83.9 kg (185 lb)       Review of Systems   Constitutional:  Negative for chills, fatigue and fever.   HENT:  Negative for congestion, rhinorrhea and sore throat.    Respiratory:  Negative for cough and shortness of breath.    Cardiovascular:  Negative for chest pain, palpitations and leg swelling.   Gastrointestinal:  Negative for abdominal pain, blood in stool, constipation, diarrhea, nausea and vomiting.   Neurological:  Negative for weakness, numbness and headaches.        PHYSICAL EXAM:  Physical Exam  Constitutional:       General: He is not in acute distress.     Appearance: Normal appearance. He is not ill-appearing.   HENT:      Head: Normocephalic and atraumatic.   Cardiovascular:      Rate and Rhythm: Normal rate and regular rhythm.      Heart sounds: Normal heart sounds.   Pulmonary:      Effort: Pulmonary effort is normal.      Breath sounds: Normal breath sounds.   Abdominal:      General: Abdomen is flat.      Palpations: Abdomen is soft.      Tenderness: There is no abdominal tenderness.   Musculoskeletal:      Right lower leg: No edema.      Left lower leg: No edema.   Skin:     General: Skin is warm and dry.   Neurological:      Mental Status: He is alert and oriented to person, place, and time.   Psychiatric:         Mood and Affect: Mood normal.         Behavior: Behavior normal.               LAB DATA REVIEWED:    No results found for this or any previous visit (from the past 12 hour(s)).      CT Result (most recent):  No results found for this or any previous visit from the past 3650 days.        Xray Result (most recent):  No results found for this or any previous visit from the past 3650  days.        _______________________________________________________________________    TOTAL TIME:  70 Minutes    Critical Care Provided     Minutes non procedure based    Signed: Flossie Dibble, PA-C    Procedures: see electronic medical records for all procedures/Xrays and details which were not copied into this note but were reviewed prior to creation of Plan.

## 2022-09-17 NOTE — H&P (Signed)
Hospitalist Admission Note    NAME: Paul Casey   DOB:  1971/03/20   MRN:  [id number]     Date/Time:  09/17/2022 5:31 PM    Patient PCP: No primary care provider on file.    ______________________________________________________________________  Given the patient's current clinical presentation, I have a high level of concern for decompensation if discharged from the emergency department.  Complex decision making was performed, which includes reviewing the patient's available past medical records, laboratory results, and x-ray films.        My assessment of this patient's clinical condition and my plan of care is as follows.    Paul Casey is a 52 year old male with a PMH of asthma and previous tobacco use who transferred here from [facility] with shortness of breath. Patient states it started 2 weeks ago and gets worse with activity. Associated orthopnea. Patient reports he has not seen a PCP in the last 5 years. Workup in ED revealed elevated BNP, elevated D-dimer and CTA with multichamber dilated cardiomegaly and pulmonary edema, no PE. ECHO with EF 0-10%, severe hypokinesis, mild/moderate aortic valve regurgitation, and mild mitral regurgitation. Patient transferred here for left and right heart cath scheduled for 2/2 with Dr. Antonietta Breach.     Assessment / Plan    New onset systolic heart failure  Negative troponin x 2  Pro BNP 5010  ECHO with EF 0-10% with severe hypokinesis   CTA with pulmonary edema and multichamber dilated cardiomegaly, no PE  Order chest xray  Order EKG  Continue lasix   Continue aspirin  Continue carvedilol  Continue entresto  Regular diet, NPO after midnight   Left and right heart cath scheduled for 2/2 with Dr. Antonietta Breach.    Asthma  Albuterol as needed    History of tobacco use   20 pack year smoker  Stopped in Nov 2023    Hyperlipidemia  LDL 114  Continue atorvastatin     Multivalvular regurgitation  ECHO mild/moderate aortic valve regurgitation, and mild mitral  regurgitation  Follow up with cardiology outpatient for serial echos     Medical Decision Making:   I personally reviewed labs: NA  I personally reviewed imaging:NA  Toxic drug monitoring: bleeding on lovenox  Discussed case with: ED provider. After discussion I am in agreement that acuity of patient's medical condition necessitates hospital stay.      Code Status: Full code  DVT Prophylaxis: Lovenox  GI Prophylaxis: Not indicated      Subjective:   CHIEF COMPLAINT: Shortness of breath     HISTORY OF PRESENT ILLNESS:     Paul Casey is a 52 y.o.  male with PMHx significant for shortness of breath.   We were asked to admit for work up and evaluation of the above problems.     History reviewed. No pertinent past medical history.     History reviewed. No pertinent surgical history.    Social History     Tobacco Use    Smoking status: Former     Current packs/day: 0.00     Average packs/day: 1 pack/day for 19.9 years (19.9 ttl pk-yrs)     Types: Cigarettes     Start date: 2004     Quit date: 07/11/2022     Years since quitting: 0.1    Smokeless tobacco: Never   Substance Use Topics    Alcohol use: Not Currently        Family History   Problem Relation Age of Onset  Cirrhosis Mother     Diabetes Mother     Heart Attack Father      No Known Allergies     Prior to Admission medications    Not on File         Objective:   VITALS:    Patient Vitals for the past 24 hrs:   Height Weight   09/17/22 1727 1.702 m (5\' 7" ) 83.9 kg (185 lb)       No data recorded.           Wt Readings from Last 12 Encounters:   09/17/22 83.9 kg (185 lb)       Review of Systems   Constitutional:  Negative for chills, fatigue and fever.   HENT:  Negative for congestion, rhinorrhea and sore throat.    Respiratory:  Negative for cough and shortness of breath.    Cardiovascular:  Negative for chest pain, palpitations and leg swelling.   Gastrointestinal:  Negative for abdominal pain, blood in stool, constipation, diarrhea, nausea and vomiting.    Neurological:  Negative for weakness, numbness and headaches.        PHYSICAL EXAM:  Physical Exam  Constitutional:       General: He is not in acute distress.     Appearance: Normal appearance. He is not ill-appearing.   HENT:      Head: Normocephalic and atraumatic.   Cardiovascular:      Rate and Rhythm: Normal rate and regular rhythm.      Heart sounds: Normal heart sounds.   Pulmonary:      Effort: Pulmonary effort is normal.      Breath sounds: Normal breath sounds.   Abdominal:      General: Abdomen is flat.      Palpations: Abdomen is soft.      Tenderness: There is no abdominal tenderness.   Musculoskeletal:      Right lower leg: No edema.      Left lower leg: No edema.   Skin:     General: Skin is warm and dry.   Neurological:      Mental Status: He is alert and oriented to person, place, and time.   Psychiatric:         Mood and Affect: Mood normal.         Behavior: Behavior normal.               LAB DATA REVIEWED:    No results found for this or any previous visit (from the past 12 hour(s)).      CT Result (most recent):  No results found for this or any previous visit from the past 3650 days.        Xray Result (most recent):  No results found for this or any previous visit from the past 3650 days.        _______________________________________________________________________    TOTAL TIME:  70 Minutes    Critical Care Provided     Minutes non procedure based    Signed: Otho Darner    Procedures: see electronic medical records for all procedures/Xrays and details which were not copied into this note but were reviewed prior to creation of Plan.

## 2022-09-17 NOTE — Plan of Care (Signed)
Problem: Discharge Planning  Goal: Discharge to home or other facility with appropriate resources  Outcome: Progressing  Flowsheets (Taken 09/17/2022 1718)  Discharge to home or other facility with appropriate resources: Identify barriers to discharge with patient and caregiver

## 2022-09-17 NOTE — Progress Notes (Signed)
2 person skin assessment performed with Mady Haagensen, RN. No skin breakdown.

## 2022-09-18 ENCOUNTER — Inpatient Hospital Stay

## 2022-09-18 LAB — EKG 12-LEAD
Atrial Rate: 89 {beats}/min
Diagnosis: NORMAL
P Axis: 77 degrees
P-R Interval: 168 ms
Q-T Interval: 390 ms
QRS Duration: 96 ms
QTc Calculation (Bazett): 474 ms
R Axis: 38 degrees
T Axis: 110 degrees
Ventricular Rate: 89 {beats}/min

## 2022-09-18 LAB — CBC WITH AUTO DIFFERENTIAL
Absolute Immature Granulocyte: 0 10*3/uL (ref 0.00–0.04)
Basophils %: 1 % (ref 0–1)
Basophils Absolute: 0 10*3/uL (ref 0.0–0.1)
Eosinophils %: 3 % (ref 0–7)
Eosinophils Absolute: 0.1 10*3/uL (ref 0.0–0.4)
Hematocrit: 46.1 % (ref 36.6–50.3)
Hemoglobin: 14.9 g/dL (ref 12.1–17.0)
Immature Granulocytes: 0 % (ref 0–0.5)
Lymphocytes %: 32 % (ref 12–49)
Lymphocytes Absolute: 1.4 10*3/uL (ref 0.8–3.5)
MCH: 27.7 PG (ref 26.0–34.0)
MCHC: 32.3 g/dL (ref 30.0–36.5)
MCV: 85.7 FL (ref 80.0–99.0)
MPV: 9.4 FL (ref 8.9–12.9)
Monocytes %: 13 % (ref 5–13)
Monocytes Absolute: 0.6 10*3/uL (ref 0.0–1.0)
Neutrophils %: 51 % (ref 32–75)
Neutrophils Absolute: 2.3 10*3/uL (ref 1.8–8.0)
Nucleated RBCs: 0 PER 100 WBC
Platelets: 193 10*3/uL (ref 150–400)
RBC: 5.38 M/uL (ref 4.10–5.70)
RDW: 14.1 % (ref 11.5–14.5)
WBC: 4.4 10*3/uL (ref 4.1–11.1)
nRBC: 0 10*3/uL (ref 0.00–0.01)

## 2022-09-18 LAB — COMPREHENSIVE METABOLIC PANEL
ALT: 64 U/L (ref 12–78)
AST: 15 U/L (ref 15–37)
Albumin/Globulin Ratio: 0.7 — ABNORMAL LOW (ref 1.1–2.2)
Albumin: 2.7 g/dL — ABNORMAL LOW (ref 3.5–5.0)
Alk Phosphatase: 74 U/L (ref 45–117)
Anion Gap: 4 mmol/L — ABNORMAL LOW (ref 5–15)
BUN: 17 mg/dL (ref 6–20)
Bun/Cre Ratio: 16 (ref 12–20)
CO2: 25 mmol/L (ref 21–32)
Calcium: 8.7 mg/dL (ref 8.5–10.1)
Chloride: 110 mmol/L — ABNORMAL HIGH (ref 97–108)
Creatinine: 1.04 mg/dL (ref 0.70–1.30)
Est, Glom Filt Rate: >60 mL/min/{1.73_m2} (ref >60)
Globulin: 3.9 g/dL (ref 2.0–4.0)
Glucose: 100 mg/dL (ref 65–100)
Potassium: 4 mmol/L (ref 3.5–5.1)
Sodium: 139 mmol/L (ref 136–145)
Total Bilirubin: 0.6 mg/dL (ref 0.2–1.0)
Total Protein: 6.6 g/dL (ref 6.4–8.2)

## 2022-09-18 LAB — TROPONIN: Troponin, High Sensitivity: 37 ng/L (ref 0–76)

## 2022-09-18 MED ORDER — NORMAL SALINE FLUSH 0.9 % IV SOLN
0.9 % | Freq: Two times a day (BID) | INTRAVENOUS | Status: AC
Start: 2022-09-18 — End: 2022-09-19
  Administered 2022-09-19 (×2): 10 mL via INTRAVENOUS

## 2022-09-18 MED ORDER — ASPIRIN 81 MG PO CHEW
81 MG | Freq: Every day | ORAL | Status: DC
Start: 2022-09-18 — End: 2022-09-19
  Administered 2022-09-18 – 2022-09-19 (×3): 81 mg via ORAL

## 2022-09-18 MED ORDER — POTASSIUM CHLORIDE 10 MEQ/100ML IV SOLN
10 MEQ/0ML | INTRAVENOUS | Status: DC | PRN
Start: 2022-09-18 — End: 2022-09-19

## 2022-09-18 MED ORDER — FENTANYL CITRATE (PF) 100 MCG/2ML IJ SOLN
100 MCG/2ML | INTRAMUSCULAR | Status: AC
Start: 2022-09-18 — End: ?

## 2022-09-18 MED ORDER — NORMAL SALINE FLUSH 0.9 % IV SOLN
0.9 % | Freq: Two times a day (BID) | INTRAVENOUS | Status: AC
Start: 2022-09-18 — End: 2022-09-19
  Administered 2022-09-18 – 2022-09-19 (×3): 10 mL via INTRAVENOUS

## 2022-09-18 MED ORDER — MIDAZOLAM HCL 2 MG/2ML IJ SOLN
2 MG/ML | INTRAMUSCULAR | Status: AC
Start: 2022-09-18 — End: ?

## 2022-09-18 MED ORDER — FUROSEMIDE 10 MG/ML IJ SOLN
10 | Freq: Two times a day (BID) | INTRAMUSCULAR | Status: DC
Start: 2022-09-18 — End: 2022-09-18
  Administered 2022-09-18: 13:00:00 20 mg via INTRAVENOUS

## 2022-09-18 MED ORDER — ACETAMINOPHEN 650 MG RE SUPP
650 | Freq: Four times a day (QID) | RECTAL | Status: DC | PRN
Start: 2022-09-18 — End: 2022-09-19

## 2022-09-18 MED ORDER — ALBUTEROL SULFATE HFA 108 (90 BASE) MCG/ACT IN AERS
108 | Freq: Four times a day (QID) | RESPIRATORY_TRACT | Status: DC | PRN
Start: 2022-09-18 — End: 2022-09-19

## 2022-09-18 MED ORDER — EMPAGLIFLOZIN 10 MG PO TABS
10 | Freq: Every day | ORAL | Status: DC
Start: 2022-09-18 — End: 2022-09-19
  Administered 2022-09-18 – 2022-09-19 (×2): 10 mg via ORAL

## 2022-09-18 MED ORDER — LIDOCAINE HCL 1 % IJ SOLN
1 % | INTRAMUSCULAR | Status: AC
Start: 2022-09-18 — End: ?

## 2022-09-18 MED ORDER — NORMAL SALINE FLUSH 0.9 % IV SOLN
0.9 % | INTRAVENOUS | Status: DC | PRN
Start: 2022-09-18 — End: 2022-09-19

## 2022-09-18 MED ORDER — NITROGLYCERIN IN D5W 200-5 MCG/ML-% IV SOLN
200-5- MCG/ML-% | INTRAVENOUS | Status: DC | PRN
Start: 2022-09-18 — End: 2022-09-18
  Administered 2022-09-18: 15:00:00 via INTRA_ARTERIAL

## 2022-09-18 MED ORDER — HEPARIN SODIUM (PORCINE) 1000 UNIT/ML IJ SOLN
1000 UNIT/ML | INTRAMUSCULAR | Status: DC | PRN
Start: 2022-09-18 — End: 2022-09-18
  Administered 2022-09-18: 15:00:00 5000 via INTRAVENOUS

## 2022-09-18 MED ORDER — FUROSEMIDE 40 MG PO TABS
40 | Freq: Every day | ORAL | Status: DC
Start: 2022-09-18 — End: 2022-09-19
  Administered 2022-09-19: 13:00:00 40 mg via ORAL

## 2022-09-18 MED ORDER — IOPAMIDOL 76 % IV SOLN
76 % | INTRAVENOUS | Status: DC | PRN
Start: 2022-09-18 — End: 2022-09-18
  Administered 2022-09-18: 15:00:00 30 via INTRA_ARTERIAL

## 2022-09-18 MED ORDER — ACETAMINOPHEN 325 MG PO TABS
325 MG | Freq: Four times a day (QID) | ORAL | Status: DC | PRN
Start: 2022-09-18 — End: 2022-09-19

## 2022-09-18 MED ORDER — ATORVASTATIN CALCIUM 40 MG PO TABS
40 | Freq: Every evening | ORAL | Status: DC
Start: 2022-09-18 — End: 2022-09-19
  Administered 2022-09-18 – 2022-09-19 (×2): 80 mg via ORAL

## 2022-09-18 MED ORDER — ENOXAPARIN SODIUM 40 MG/0.4ML IJ SOSY
40 MG/0.4ML | Freq: Every day | INTRAMUSCULAR | Status: DC
Start: 2022-09-18 — End: 2022-09-19
  Administered 2022-09-19: 02:00:00 40 mg via SUBCUTANEOUS

## 2022-09-18 MED ORDER — CARVEDILOL 3.125 MG PO TABS
3.125 | Freq: Two times a day (BID) | ORAL | Status: DC
Start: 2022-09-18 — End: 2022-09-19
  Administered 2022-09-18 – 2022-09-19 (×3): 3.125 mg via ORAL

## 2022-09-18 MED ORDER — NITROGLYCERIN IN D5W 200-5 MCG/ML-% IV SOLN
200-5 MCG/ML-% | INTRAVENOUS | Status: AC
Start: 2022-09-18 — End: ?

## 2022-09-18 MED ORDER — MAGNESIUM SULFATE 2000 MG/50 ML IVPB PREMIX
250 GM/50ML | INTRAVENOUS | Status: AC | PRN
Start: 2022-09-18 — End: 2022-09-19

## 2022-09-18 MED ORDER — ONDANSETRON 4 MG PO TBDP
4 MG | Freq: Three times a day (TID) | ORAL | Status: DC | PRN
Start: 2022-09-18 — End: 2022-09-19

## 2022-09-18 MED ORDER — HEPARIN SODIUM (PORCINE) 1000 UNIT/ML IJ SOLN
1000 UNIT/ML | INTRAMUSCULAR | Status: AC
Start: 2022-09-18 — End: ?

## 2022-09-18 MED ORDER — ACETAMINOPHEN 325 MG PO TABS
325 MG | ORAL | Status: DC | PRN
Start: 2022-09-18 — End: 2022-09-19

## 2022-09-18 MED ORDER — SPIRONOLACTONE 25 MG PO TABS
25 MG | Freq: Every day | ORAL | Status: DC
Start: 2022-09-18 — End: 2022-09-19
  Administered 2022-09-18 – 2022-09-19 (×2): 25 mg via ORAL

## 2022-09-18 MED ORDER — SACUBITRIL-VALSARTAN 24-26 MG PO TABS
24-26 | Freq: Two times a day (BID) | ORAL | Status: DC
Start: 2022-09-18 — End: 2022-09-19
  Administered 2022-09-18 – 2022-09-19 (×4): 1 via ORAL

## 2022-09-18 MED ORDER — POLYETHYLENE GLYCOL 3350 17 G PO PACK
17 g | Freq: Every day | ORAL | Status: AC | PRN
Start: 2022-09-18 — End: 2022-09-19

## 2022-09-18 MED ORDER — ONDANSETRON HCL 4 MG/2ML IJ SOLN
4 MG/2ML | Freq: Four times a day (QID) | INTRAMUSCULAR | Status: DC | PRN
Start: 2022-09-18 — End: 2022-09-19

## 2022-09-18 MED ORDER — HEPARIN (PORCINE) IN NACL 1000-0.9 UT/500ML-% IV SOLN
INTRAVENOUS | Status: AC
Start: 2022-09-18 — End: ?

## 2022-09-18 MED ORDER — VERAPAMIL HCL 2.5 MG/ML IV SOLN
2.5 MG/ML | INTRAVENOUS | Status: AC
Start: 2022-09-18 — End: ?

## 2022-09-18 MED ORDER — LIDOCAINE HCL 1 % IJ SOLN
1 % | INTRAMUSCULAR | Status: DC | PRN
Start: 2022-09-18 — End: 2022-09-18
  Administered 2022-09-18 (×2): 1 via INTRADERMAL

## 2022-09-18 MED ORDER — SODIUM CHLORIDE 0.9 % IV SOLN
0.9 % | INTRAVENOUS | Status: DC | PRN
Start: 2022-09-18 — End: 2022-09-19

## 2022-09-18 MED ORDER — POTASSIUM CHLORIDE CRYS ER 20 MEQ PO TBCR
20 MEQ | ORAL | Status: DC | PRN
Start: 2022-09-18 — End: 2022-09-19

## 2022-09-18 MED ORDER — POTASSIUM BICARB-CITRIC ACID 20 MEQ PO TBEF
20 MEQ | ORAL | Status: AC | PRN
Start: 2022-09-18 — End: 2022-09-19

## 2022-09-18 MED FILL — ATORVASTATIN CALCIUM 40 MG PO TABS: 40 MG | ORAL | Qty: 2

## 2022-09-18 MED FILL — HEPARIN (PORCINE) IN NACL 1000-0.9 UT/500ML-% IV SOLN: INTRAVENOUS | Qty: 1500

## 2022-09-18 MED FILL — LIDOCAINE HCL 1 % IJ SOLN: 1 % | INTRAMUSCULAR | Qty: 20

## 2022-09-18 MED FILL — ENTRESTO 24-26 MG PO TABS: 24-26 MG | ORAL | Qty: 1

## 2022-09-18 MED FILL — FENTANYL CITRATE (PF) 100 MCG/2ML IJ SOLN: 100 MCG/2ML | INTRAMUSCULAR | Qty: 2

## 2022-09-18 MED FILL — SODIUM CHLORIDE FLUSH 0.9 % IV SOLN: 0.9 % | INTRAVENOUS | Qty: 40

## 2022-09-18 MED FILL — JARDIANCE 10 MG PO TABS: 10 MG | ORAL | Qty: 1

## 2022-09-18 MED FILL — ASPIRIN LOW STRENGTH 81 MG PO CHEW: 81 MG | ORAL | Qty: 1

## 2022-09-18 MED FILL — VERAPAMIL HCL 2.5 MG/ML IV SOLN: 2.5 MG/ML | INTRAVENOUS | Qty: 2

## 2022-09-18 MED FILL — MIDAZOLAM HCL 2 MG/2ML IJ SOLN: 2 MG/ML | INTRAMUSCULAR | Qty: 2

## 2022-09-18 MED FILL — NITROGLYCERIN IN D5W 200-5 MCG/ML-% IV SOLN: 200-5 MCG/ML-% | INTRAVENOUS | Qty: 250

## 2022-09-18 MED FILL — CARVEDILOL 3.125 MG PO TABS: 3.125 MG | ORAL | Qty: 1

## 2022-09-18 MED FILL — SPIRONOLACTONE 25 MG PO TABS: 25 MG | ORAL | Qty: 1

## 2022-09-18 MED FILL — FUROSEMIDE 10 MG/ML IJ SOLN: 10 MG/ML | INTRAMUSCULAR | Qty: 2

## 2022-09-18 MED FILL — HEPARIN SODIUM (PORCINE) 1000 UNIT/ML IJ SOLN: 1000 UNIT/ML | INTRAMUSCULAR | Qty: 10

## 2022-09-18 MED FILL — ENOXAPARIN SODIUM 40 MG/0.4ML IJ SOSY: 40 MG/0.4ML | INTRAMUSCULAR | Qty: 0.4

## 2022-09-18 NOTE — Progress Notes (Signed)
Hospitalist Progress Note    NAME:   Paul Casey   DOB: March 04, 1971   MRN: [id number]     Date/Time: 09/18/2022 4:11 PM  Patient PCP: No primary care provider on file.    Estimated discharge date: 24 hours  Barriers:     Paul Casey is a 52 year old male with a PMH of asthma and previous tobacco use who was admitted on 2/1 for shortness of breath. Patient states it started 2 weeks ago and gets worse with activity. Associated orthopnea. Patient reports he has not seen a PCP in the last 5 years. Workup in ED at outside facility revealed elevated BNP, elevated D-dimer and CTA with multichamber dilated cardiomegaly and pulmonary edema, no PE. ECHO with EF 0-10%, severe hypokinesis, mild/moderate aortic valve regurgitation, and mild mitral regurgitation. Patient transferred here from [facility] for left and right heart cath scheduled for 2/2 with Dr. Antonietta Breach. Cardiac cath with normal coronary arteries.     Assessment / Plan:    Acute systolic heart failure  Negative troponin x 2  Pro BNP 5010  ECHO with EF 0-10% with severe hypokinesis   CTA with pulmonary edema and multichamber dilated cardiomegaly, no PE  CXR no acute processes 2/1  EKG   Left and right heart cath with normal coronary arteries   Continue lasix   Continue aspirin  Continue carvedilol  Continue entresto  Continue spironolactone   Continue jardiance  Patient considering LifeVest    Asthma  Albuterol as needed     History of tobacco use   20 pack year smoker  Stopped in Nov 2023     Hyperlipidemia  LDL 114  Continue atorvastatin      Multivalvular regurgitation  ECHO mild/moderate aortic valve regurgitation, and mild mitral regurgitation  Follow up with cardiology outpatient for serial echos      Medical Decision Making:   I personally reviewed labs: WBC 4.4  I personally reviewed imaging:  XR CHEST PORTABLE   Final Result      No acute process on portable chest.            Toxic drug monitoring: bleeding on lovenox  Discussed case with:  patient    Code Status: full code  DVT Prophylaxis: lovenox  GI Prophylaxis: Not indicated    Subjective:     Chief Complaint / Reason for Physician Visit  Patient doing well sp cardiac cath.  Discussed with RN events overnight.       Objective:     VITALS:   Last 24hrs VS reviewed since prior progress note. Most recent are:  Patient Vitals for the past 24 hrs:   BP Temp Temp src Pulse Resp SpO2 Height Weight   09/18/22 1135 (!) 118/90 98.2 F (36.8 C) Oral (!) 102 16 99 % -- --   09/18/22 1050 107/83 -- -- (!) 103 19 98 % -- --   09/18/22 1045 102/73 -- -- 97 13 96 % -- --   09/18/22 1040 101/74 -- -- 98 15 97 % -- --   09/18/22 1036 104/77 98.6 F (37 C) Oral 98 21 97 % -- --   09/18/22 0724 109/76 98.1 F (36.7 C) Oral 100 18 96 % -- --   09/18/22 0316 91/62 98.1 F (36.7 C) -- 99 -- -- -- --   09/17/22 2200 100/66 -- -- -- -- -- -- --   09/17/22 1945 98/66 -- -- (!) 108 20 96 % -- --   09/17/22 1727  108/79 98.1 F (36.7 C) Oral (!) 108 20 98 % 1.702 m (5\' 7" ) 83.9 kg (185 lb)           Intake/Output Summary (Last 24 hours) at 09/18/2022 1611  Last data filed at 09/18/2022 1018  Gross per 24 hour   Intake --   Output 30 ml   Net -30 ml          I had a face to face encounter and independently examined this patient on 09/18/2022, as outlined below:    Review of Systems   Constitutional:  Negative for fatigue.   Respiratory:  Negative for cough, shortness of breath and wheezing.    Cardiovascular:  Negative for chest pain, palpitations and leg swelling.   Gastrointestinal:  Negative for abdominal pain, nausea and vomiting.   Neurological:  Negative for dizziness, weakness and headaches.        PHYSICAL EXAM:  Physical Exam  Constitutional:       General: He is not in acute distress.     Appearance: Normal appearance.   HENT:      Head: Normocephalic and atraumatic.   Cardiovascular:      Rate and Rhythm: Normal rate and regular rhythm.      Heart sounds: Normal heart sounds.   Pulmonary:      Effort: Pulmonary  effort is normal.      Breath sounds: Normal breath sounds.   Abdominal:      General: Abdomen is flat.      Palpations: Abdomen is soft.      Tenderness: There is no abdominal tenderness.   Skin:     General: Skin is warm and dry.   Neurological:      Mental Status: He is alert and oriented to person, place, and time.   Psychiatric:         Mood and Affect: Mood normal.         Behavior: Behavior normal.          Reviewed most current lab test results and cultures  YES  Reviewed most current radiology test results   YES  Review and summation of old records today    NO  Reviewed patient's current orders and MAR    YES  PMH/SH reviewed - no change compared to H&P  ________________________________________________________________________  Care Plan discussed with:    Comments   Patient x    Family      RN x    Care Manager     Consultant                        Multidiciplinary team rounds were held today with case manager, nursing, pharmacist and Occupational psychologist.  Patient's plan of care was discussed; medications were reviewed and discharge planning was addressed.     ________________________________________________________________________  Total NON critical care TIME:  35  Minutes    Total CRITICAL CARE TIME Spent:   Minutes non procedure based      Comments   >50% of visit spent in counseling and coordination of care     ________________________________________________________________________  Flossie Dibble, PA-C     Procedures: see electronic medical records for all procedures/Xrays and details which were not copied into this note but were reviewed prior to creation of Plan.      LABS:  I reviewed today's most current labs and imaging studies.  Pertinent labs include:  Recent Labs     09/18/22  0709   WBC 4.4   HGB 14.9   HCT 46.1   PLT 193       Recent Labs     09/18/22  0709   NA 139   K 4.0   CL 110*   CO2 25   BUN 17   ALT 64         Signed: Flossie Dibble, PA-C

## 2022-09-18 NOTE — Other (Signed)
Heart Failure Nurse Navigator: Heart Failure Education    RN performs Comptroller. RN Introduces herself to the patient/family members and informs the patient/family members of the reasoning for the visit.    Patient Verbalizes Understanding for visit, is accepting of discussion    Persons present for Education: Patient/Family Members    Time Spent: At least 59minutes    Method of teaching:  Teach-back, demonstration, verbal, visual    Education Packets Given: Heart Failure symptom management calendar/tracker, AHA HF education booklet, HF magnet    -Confirmation of working scales & that the patient can read numbers  -Confirmation of support to assist with daily weights if patient unable to perform independently.     RN-NN provided education on Daily weights to include same time daily, on same scale, and documenting weights on calendar. RN-NN provided education trigger management/ signs and symptoms of Heart Failure. Patient is advised on "Yellow Days" to call MD office and on "Red Days" to come to the ER or call 911.   RN provides Nutritional education that includes how to calculate and restrict sodium and fluid intake. Encouraged fresh vegetable choice over canned/processed goods. Demonstrated how to log meals/intake. RN discusses lifestyle changes including cessation of smoking and increasing activity level.   In addition, RN discusses the importance of keeping/scheduling appointments and adherence to guideline directed medical therapies.      Patient/family members was able to teach back to the RN-NN the above information.  Patient/family members will need reinforcement of education provided.  Patient advised about the HF helper App.      **PT given Delene Loll 30day free card and Patient assistance form. Patient's Echo done at [facility]. Patient may not be a candidate for LifeVest due to no Blockage. Unsure since no Echo on file showing if Dilation. N.P. Karie Chimera states she will review Echo from other  hospital to see if he qualifies for lifevest.

## 2022-09-18 NOTE — Consults (Signed)
CARDIOLOGY CONSULTATION    REASON FOR CONSULT: CHF     REQUESTING PROVIDER: Delma Freeze, PA-C    CHIEF COMPLAINT:  Shortness of breath     HISTORY OF PRESENT ILLNESS:  Paul Casey is a 52 y.o. year-old male with past medical history significant for asthma and previous tobacco use who was evaluated today due to CHF. Patient initially presented to [facility] ED on 09/14/2022 with worsening shortness of breath, which started around Thanksgiving. He had tried inhalers and OTC cold medications without relief. Patient reports he has not seen a PCP in the last 5 years. Workup in ED revealed elevated BNP, elevated D-dimer and CTA with multichamber dilated cardiomegaly and pulmonary edema, no PE. ECHO with EF 0-10%, severe hypokinesis, mild/moderate aortic valve regurgitation, and mild mitral regurgitation. Patient transferred here overnight for left and right heart cath scheduled for today.     Records from hospital admission course thus far reviewed.      Telemetry reviewed.       INPATIENT MEDICATIONS:  Home medications reviewed.    Current Facility-Administered Medications:     sodium chloride flush 0.9 % injection 5-40 mL, 5-40 mL, IntraVENous, 2 times per day, Storm Frisk, MD    sodium chloride flush 0.9 % injection 5-40 mL, 5-40 mL, IntraVENous, PRN, Storm Frisk, MD    0.9 % sodium chloride infusion, , IntraVENous, PRN, Storm Frisk, MD    acetaminophen (TYLENOL) tablet 650 mg, 650 mg, Oral, Q4H PRN, Storm Frisk, MD    empagliflozin (JARDIANCE) tablet 10 mg, 10 mg, Oral, Daily, Sidell, Victoria F, APRN - NP    Derrill Memo ON 09/19/2022] furosemide (LASIX) tablet 40 mg, 40 mg, Oral, Daily, Sidell, Victoria F, APRN - NP    sodium chloride flush 0.9 % injection 5-40 mL, 5-40 mL, IntraVENous, 2 times per day, Reasoner, Robert Z, PA-C, 10 mL at 09/18/22 0804    sodium chloride flush 0.9 % injection 5-40 mL, 5-40 mL, IntraVENous, PRN, Reasoner, Robert Z, PA-C    0.9 % sodium chloride  infusion, , IntraVENous, PRN, Reasoner, Gasper Sells, PA-C    potassium chloride (KLOR-CON M) extended release tablet 40 mEq, 40 mEq, Oral, PRN **OR** potassium bicarb-citric acid (EFFER-K) effervescent tablet 40 mEq, 40 mEq, Oral, PRN **OR** potassium chloride 10 mEq/100 mL IVPB (Peripheral Line), 10 mEq, IntraVENous, PRN, Reasoner, Gasper Sells, PA-C    magnesium sulfate 2000 mg in 50 mL IVPB premix, 2,000 mg, IntraVENous, PRN, Reasoner, Robert Z, PA-C    enoxaparin (LOVENOX) injection 40 mg, 40 mg, SubCUTAneous, Daily, Reasoner, Robert Z, PA-C    ondansetron (ZOFRAN-ODT) disintegrating tablet 4 mg, 4 mg, Oral, Q8H PRN **OR** ondansetron (ZOFRAN) injection 4 mg, 4 mg, IntraVENous, Q6H PRN, Reasoner, Robert Z, PA-C    polyethylene glycol (GLYCOLAX) packet 17 g, 17 g, Oral, Daily PRN, Reasoner, Gasper Sells, PA-C    acetaminophen (TYLENOL) tablet 650 mg, 650 mg, Oral, Q6H PRN **OR** acetaminophen (TYLENOL) suppository 650 mg, 650 mg, Rectal, Q6H PRN, Reasoner, Gasper Sells, PA-C    aspirin chewable tablet 81 mg, 81 mg, Oral, Daily, Reasoner, Gasper Sells, PA-C, 81 mg at 09/18/22 0804    carvedilol (COREG) tablet 3.125 mg, 3.125 mg, Oral, BID WC, Reasoner, Robert Z, PA-C, 3.125 mg at 09/18/22 0804    sacubitril-valsartan (ENTRESTO) 24-26 MG per tablet 1 tablet, 1 tablet, Oral, BID, Reasoner, Gasper Sells, PA-C, 1 tablet at 09/18/22 0804    albuterol sulfate HFA (PROVENTIL;VENTOLIN;PROAIR) 108 (90 Base) MCG/ACT inhaler 2 puff, 2  puff, Inhalation, Q6H PRN, Reasoner, Gasper Sells, PA-C    atorvastatin (LIPITOR) tablet 80 mg, 80 mg, Oral, Nightly, Reasoner, Gasper Sells, PA-C, 80 mg at 09/17/22 2045     ALLERGIES:  Allergies reviewed with the patient,No Known Allergies .      FAMILY HISTORY:  Family history reviewed.        SOCIAL HISTORY:  Notable for former tobacco use, no heavy alcohol or illicit drug use.      REVIEW OF SYSTEMS:  Complete review of systems performed, pertinents noted above, all other systems are negative.    PHYSICAL EXAMINATION:     General:  Alert, oriented, in NAD  Cardiovascular:  RRR, No murmur  Respiratory:  Lungs are clear  Abdomen:  Soft, nontender  Extremities:  No lower extremity edema  Skin:  Dry, warm  Psych:  Normal affect      Vitals:    09/18/22 1135   BP: (!) 118/90   Pulse: (!) 102   Resp: 16   Temp: 98.2 F (36.8 C)   SpO2: 99%       Recent labs results and imaging reviewed.     Discussed case with Dr. Candiss Norse and our impression and recommendations are as follows:  CHF: Echo with EF 0-10%  Cath today with normal coronary arteries  Will continue GDMT with Entresto tier 1, BB  Continue Lasix 40mg  PO daily  Will start [name] and Jardiance today. Continue to up titrate as outpatient.   Continue ASA  Discussed LifeVest as primary prevention of SCD given low EF. Patient wishes to think this over and discuss further as outpatient  Multivalvular regurgitation: calcification noted. Mild to mod AVR, mild MR. Will monitor with serial echos as outpatient.   Hyperlipidemia: LDL 114, continue statin therapy   Asthma: per primary, albuterol PRN  Tobacco abuse: Needs encouraged continued cessation     Thank you for involving [place] in the care of this patient.  Please do not hesitate to call me or Dr. Candiss Norse if additional questions arise.    Axelle Szwed, Sharlene Motts, APRN - NP  09/18/2022

## 2022-09-18 NOTE — Progress Notes (Signed)
Hospitalist Progress Note    NAME:   Paul Casey   DOB: 1971/03/09   MRN: [id number]     Date/Time: 09/18/2022 3:40 PM  Patient PCP: No primary care provider on file.    Estimated discharge date: 24 hours  Barriers:     Paul Casey is a 52 year old male with a PMH of asthma and previous tobacco use who was admitted on 2/1 for shortness of breath. Patient states it started 2 weeks ago and gets worse with activity. Associated orthopnea. Patient reports he has not seen a PCP in the last 5 years. Workup in ED at outside facility revealed elevated BNP, elevated D-dimer and CTA with multichamber dilated cardiomegaly and pulmonary edema, no PE. ECHO with EF 0-10%, severe hypokinesis, mild/moderate aortic valve regurgitation, and mild mitral regurgitation. Patient transferred here from [facility] for left and right heart cath scheduled for 2/2 with Dr. Antonietta Breach. Cardiac cath with normal coronary arteries.     Assessment / Plan:    Acute systolic heart failure  Negative troponin x 2  Pro BNP 5010  ECHO with EF 0-10% with severe hypokinesis   CTA with pulmonary edema and multichamber dilated cardiomegaly, no PE  CXR no acute processes 2/1  EKG   Left and right heart cath with normal coronary arteries   Continue lasix   Continue aspirin  Continue carvedilol  Continue entresto  Continue spironolactone   Continue jardiance  Patient considering LifeVest    Asthma  Albuterol as needed     History of tobacco use   20 pack year smoker  Stopped in Nov 2023     Hyperlipidemia  LDL 114  Continue atorvastatin      Multivalvular regurgitation  ECHO mild/moderate aortic valve regurgitation, and mild mitral regurgitation  Follow up with cardiology outpatient for serial echos      Medical Decision Making:   I personally reviewed labs: .LAB24[wbc  I personally reviewed imaging:  XR CHEST PORTABLE   Final Result      No acute process on portable chest.            Toxic drug monitoring: bleeding on lovenox  Discussed case with:  patient        Code Status: full code  DVT Prophylaxis: lovenox  GI Prophylaxis: Not indicated    Subjective:     Chief Complaint / Reason for Physician Visit  Patient doing well sp cardiac cath.  Discussed with RN events overnight.       Objective:     VITALS:   Last 24hrs VS reviewed since prior progress note. Most recent are:  Patient Vitals for the past 24 hrs:   BP Temp Temp src Pulse Resp SpO2 Height Weight   09/18/22 1135 (!) 118/90 98.2 F (36.8 C) Oral (!) 102 16 99 % -- --   09/18/22 1050 107/83 -- -- (!) 103 19 98 % -- --   09/18/22 1045 102/73 -- -- 97 13 96 % -- --   09/18/22 1040 101/74 -- -- 98 15 97 % -- --   09/18/22 1036 104/77 98.6 F (37 C) Oral 98 21 97 % -- --   09/18/22 0724 109/76 98.1 F (36.7 C) Oral 100 18 96 % -- --   09/18/22 0316 91/62 98.1 F (36.7 C) -- 99 -- -- -- --   09/17/22 2200 100/66 -- -- -- -- -- -- --   09/17/22 1945 98/66 -- -- (!) 108 20 96 % -- --  09/17/22 1727 108/79 98.1 F (36.7 C) Oral (!) 108 20 98 % 1.702 m (5\' 7" ) 83.9 kg (185 lb)         Intake/Output Summary (Last 24 hours) at 09/18/2022 1540  Last data filed at 09/18/2022 1018  Gross per 24 hour   Intake --   Output 30 ml   Net -30 ml        I had a face to face encounter and independently examined this patient on 09/18/2022, as outlined below:    Review of Systems   Constitutional:  Negative for fatigue.   Respiratory:  Negative for cough, shortness of breath and wheezing.    Cardiovascular:  Negative for chest pain, palpitations and leg swelling.   Gastrointestinal:  Negative for abdominal pain, nausea and vomiting.   Neurological:  Negative for dizziness, weakness and headaches.        PHYSICAL EXAM:  Physical Exam  Constitutional:       General: He is not in acute distress.     Appearance: Normal appearance.   HENT:      Head: Normocephalic and atraumatic.   Cardiovascular:      Rate and Rhythm: Normal rate and regular rhythm.      Heart sounds: Normal heart sounds.   Pulmonary:      Effort: Pulmonary  effort is normal.      Breath sounds: Normal breath sounds.   Abdominal:      General: Abdomen is flat.      Palpations: Abdomen is soft.      Tenderness: There is no abdominal tenderness.   Skin:     General: Skin is warm and dry.   Neurological:      Mental Status: He is alert and oriented to person, place, and time.   Psychiatric:         Mood and Affect: Mood normal.         Behavior: Behavior normal.          Reviewed most current lab test results and cultures  YES  Reviewed most current radiology test results   YES  Review and summation of old records today    NO  Reviewed patient's current orders and MAR    YES  PMH/SH reviewed - no change compared to H&P  ________________________________________________________________________  Care Plan discussed with:    Comments   Patient x    Family      RN x    Care Manager     Consultant                        Multidiciplinary team rounds were held today with case manager, nursing, pharmacist and Occupational psychologist.  Patient's plan of care was discussed; medications were reviewed and discharge planning was addressed.     ________________________________________________________________________  Total NON critical care TIME:  35  Minutes    Total CRITICAL CARE TIME Spent:   Minutes non procedure based      Comments   >50% of visit spent in counseling and coordination of care     ________________________________________________________________________  Otho Darner     Procedures: see electronic medical records for all procedures/Xrays and details which were not copied into this note but were reviewed prior to creation of Plan.      LABS:  I reviewed today's most current labs and imaging studies.  Pertinent labs include:  Recent Labs     09/18/22  0709   WBC  4.4   HGB 14.9   HCT 46.1   PLT 193     Recent Labs     09/18/22  0709   NA 139   K 4.0   CL 110*   CO2 25   BUN 17   ALT 64       Signed: Otho Darner

## 2022-09-18 NOTE — Other (Signed)
Pt states he will call his daughter when he gets back to his room.

## 2022-09-18 NOTE — Care Coordination-Inpatient (Signed)
09/18/22 1438   Service Assessment   Patient Orientation Alert and Oriented   Cognition Alert   History Provided By Patient   Primary Caregiver Self   Accompanied By/Relationship alone in room   [place] is: Legal Next of Kin   PCP Verified by CM No   Prior Functional Level Independent in ADLs/IADLs   Current Functional Level Independent in ADLs/IADLs   Can patient return to prior living arrangement Yes   Ability to make needs known: Good   Family able to assist with home care needs: Yes   Would you like for me to discuss the discharge plan with any other family members/significant others, and if so, who? Yes  (girlfriend, and daughter)   Social/Functional History   Lives With Significant other   Type of [place] Two level   [place] to enter with rails   Entrance Stairs - Number of Steps 5 steps   Receives Help From Family   ADL Assistance Independent   Homemaking Assistance Independent   Homemaking Responsibilities Yes   Active Driver Yes   Mode of Scientist, product/process development   Occupation Full time employment   Discharge Planning   Type of [place] Prior To Admission None   Potential Assistance Needed N/A   DME Ordered? No   Potential Assistance Purchasing Medications No   Type of Home Care Services None   Patient expects to be discharged to: [place] Discharge   Services [place] Discharge None   [employer] Information Provided? No   Confirm Follow Up Transport Family     Patient lives with his girlfriend in a 2 story home with 5 steps to entrance. Drives and works full time. No HH, SNF IRF or DME. [facility] [address].     Advance Care Planning     General Advance Care Planning (ACP) Conversation    Date of Conversation: 09/16/2022  Conducted with: Patient with Decision Making  Capacity    Healthcare Decision Maker:  No healthcare decision makers have been documented.  Click here to complete Haematologist of the Research scientist (physical sciences) Relationship (ie "Primary")   Today we documented Decision Maker(s) consistent with Legal Next of Kin hierarchy.    Content/Action Overview:  Has ACP document(s) on file - reflects the patient's care preferences  Reviewed DNR/DNI and patient elects Full Code (Attempt Resuscitation)        Length of Voluntary ACP Conversation in minutes:  <16 minutes (Non-Billable)    Rylan Kaufmann Delford Field, RN

## 2022-09-19 LAB — CBC WITH AUTO DIFFERENTIAL
Absolute Immature Granulocyte: 0 10*3/uL (ref 0.00–0.04)
Basophils %: 1 % (ref 0–1)
Basophils Absolute: 0 10*3/uL (ref 0.0–0.1)
Eosinophils %: 2 % (ref 0–7)
Eosinophils Absolute: 0.1 10*3/uL (ref 0.0–0.4)
Hematocrit: 44.9 % (ref 36.6–50.3)
Hemoglobin: 14.5 g/dL (ref 12.1–17.0)
Immature Granulocytes: 0 % (ref 0–0.5)
Lymphocytes %: 36 % (ref 12–49)
Lymphocytes Absolute: 1.4 10*3/uL (ref 0.8–3.5)
MCH: 27.6 PG (ref 26.0–34.0)
MCHC: 32.3 g/dL (ref 30.0–36.5)
MCV: 85.5 FL (ref 80.0–99.0)
MPV: 9.5 FL (ref 8.9–12.9)
Monocytes %: 12 % (ref 5–13)
Monocytes Absolute: 0.5 10*3/uL (ref 0.0–1.0)
Neutrophils %: 49 % (ref 32–75)
Neutrophils Absolute: 2 10*3/uL (ref 1.8–8.0)
Nucleated RBCs: 0 PER 100 WBC
Platelets: 172 10*3/uL (ref 150–400)
RBC: 5.25 M/uL (ref 4.10–5.70)
RDW: 14.3 % (ref 11.5–14.5)
WBC: 4 10*3/uL — ABNORMAL LOW (ref 4.1–11.1)
nRBC: 0 10*3/uL (ref 0.00–0.01)

## 2022-09-19 LAB — COMPREHENSIVE METABOLIC PANEL
ALT: 56 U/L (ref 12–78)
AST: 19 U/L (ref 15–37)
Albumin/Globulin Ratio: 0.8 — ABNORMAL LOW (ref 1.1–2.2)
Albumin: 2.9 g/dL — ABNORMAL LOW (ref 3.5–5.0)
Alk Phosphatase: 73 U/L (ref 45–117)
Anion Gap: 4 mmol/L — ABNORMAL LOW (ref 5–15)
BUN: 16 mg/dL (ref 6–20)
Bun/Cre Ratio: 16 (ref 12–20)
CO2: 26 mmol/L (ref 21–32)
Calcium: 8.7 mg/dL (ref 8.5–10.1)
Chloride: 109 mmol/L — ABNORMAL HIGH (ref 97–108)
Creatinine: 0.98 mg/dL (ref 0.70–1.30)
Est, Glom Filt Rate: >60 mL/min/{1.73_m2} (ref >60)
Globulin: 3.8 g/dL (ref 2.0–4.0)
Glucose: 100 mg/dL (ref 65–100)
Potassium: 3.9 mmol/L (ref 3.5–5.1)
Sodium: 139 mmol/L (ref 136–145)
Total Bilirubin: 0.7 mg/dL (ref 0.2–1.0)
Total Protein: 6.7 g/dL (ref 6.4–8.2)

## 2022-09-19 MED ORDER — CARVEDILOL 3.125 MG PO TABS
3.125 MG | ORAL_TABLET | Freq: Two times a day (BID) | ORAL | 3 refills | Status: AC
Start: 2022-09-19 — End: ?

## 2022-09-19 MED ORDER — FUROSEMIDE 40 MG PO TABS
40 MG | ORAL_TABLET | Freq: Every day | ORAL | 3 refills | Status: AC
Start: 2022-09-19 — End: ?

## 2022-09-19 MED ORDER — ASPIRIN 81 MG PO CHEW
81 MG | ORAL_TABLET | Freq: Every day | ORAL | 3 refills | Status: AC
Start: 2022-09-19 — End: ?

## 2022-09-19 MED ORDER — EMPAGLIFLOZIN 10 MG PO TABS
10 MG | ORAL_TABLET | Freq: Every day | ORAL | 3 refills | Status: AC
Start: 2022-09-19 — End: ?

## 2022-09-19 MED ORDER — ALBUTEROL SULFATE HFA 108 (90 BASE) MCG/ACT IN AERS
108 (90 Base) MCG/ACT | Freq: Four times a day (QID) | RESPIRATORY_TRACT | 3 refills | Status: AC | PRN
Start: 2022-09-19 — End: ?

## 2022-09-19 MED ORDER — ATORVASTATIN CALCIUM 80 MG PO TABS
80 MG | ORAL_TABLET | Freq: Every evening | ORAL | 3 refills | Status: AC
Start: 2022-09-19 — End: ?

## 2022-09-19 MED ORDER — SACUBITRIL-VALSARTAN 24-26 MG PO TABS
24-26 MG | ORAL_TABLET | Freq: Two times a day (BID) | ORAL | 1 refills | Status: AC
Start: 2022-09-19 — End: ?

## 2022-09-19 MED ORDER — SPIRONOLACTONE 25 MG PO TABS
25 MG | ORAL_TABLET | Freq: Every day | ORAL | 3 refills | Status: AC
Start: 2022-09-19 — End: ?

## 2022-09-19 MED FILL — FUROSEMIDE 40 MG PO TABS: 40 MG | ORAL | Qty: 1

## 2022-09-19 MED FILL — ENOXAPARIN SODIUM 40 MG/0.4ML IJ SOSY: 40 MG/0.4ML | INTRAMUSCULAR | Qty: 0.4

## 2022-09-19 MED FILL — ENTRESTO 24-26 MG PO TABS: 24-26 MG | ORAL | Qty: 1

## 2022-09-19 MED FILL — SPIRONOLACTONE 25 MG PO TABS: 25 MG | ORAL | Qty: 1

## 2022-09-19 MED FILL — ASPIRIN LOW STRENGTH 81 MG PO CHEW: 81 MG | ORAL | Qty: 1

## 2022-09-19 MED FILL — CARVEDILOL 3.125 MG PO TABS: 3.125 MG | ORAL | Qty: 1

## 2022-09-19 MED FILL — JARDIANCE 10 MG PO TABS: 10 MG | ORAL | Qty: 1

## 2022-09-19 MED FILL — ATORVASTATIN CALCIUM 40 MG PO TABS: 40 MG | ORAL | Qty: 2

## 2022-09-19 NOTE — Other (Cosign Needed)
Discharge paperwork reviewed with pt. Pt demonstrated understanding and did not have any questions. Tele box and IV removed. Pt will be discharged and transported home by spouse.

## 2022-09-19 NOTE — Progress Notes (Signed)
[id number] Cardiology cleared pt to be fitted for lifevest at home. Spoke with case management regarding pt not having insurance. CM stated lifevest will follow up with CM office for payment.

## 2022-09-19 NOTE — Plan of Care (Cosign Needed)
Problem: Discharge Planning  Goal: Discharge to home or other facility with appropriate resources  Outcome: Completed     Problem: Chronic Conditions and Co-morbidities  Goal: Patient's chronic conditions and co-morbidity symptoms are monitored and maintained or improved  Outcome: Completed

## 2022-09-19 NOTE — Discharge Summary (Signed)
Discharge Summary    Name: Paul Casey  [id number]  Date of Birth: 03/30/1971 (Age: 52 y.o.)   Date of Admission: 09/17/2022  Date of Discharge: 09/19/2022  Attending Physician: Jacinto Reap, MD    Discharge Diagnosis:   Principal Problem:    Acute systolic (congestive) heart failure ([place])  Resolved Problems:    * No resolved hospital problems. *       Consultations:  IP CONSULT TO CARDIOLOGY      Brief Admission History/Reason for Admission Per Mir Thurston Pounds, MD:       [facility] Hospital Course by Main Problems:   Manfred Laspina is a 52 year old male with a PMH of asthma and previous tobacco use who was admitted on 2/1 for shortness of breath. Patient states it started 2 weeks ago and gets worse with activity. Associated orthopnea. Patient reports he has not seen a PCP in the last 5 years. Workup in ED at outside facility revealed elevated BNP, elevated D-dimer and CTA with multichamber dilated cardiomegaly and pulmonary edema, no PE. ECHO with EF 0-10%, severe hypokinesis, mild/moderate aortic valve regurgitation, and mild mitral regurgitation. Patient transferred from [facility] for left and right heart cath scheduled for 2/2 with Dr. Antonietta Breach. Cardiac cath with normal coronary arteries.  Patient awaiting decision from [place] and may need assistance.  Patient will follow-up with cardiology in 1 to 2 weeks and will need an MRI of the heart at some point.    Discharge Exam:  Patient seen and examined by me on discharge day.  Pertinent Findings:  Patient Vitals for the past 24 hrs:   BP Temp Temp src Pulse Resp SpO2   09/19/22 1048 105/75 97.9 F (36.6 C) Oral (!) 101 15 99 %   09/19/22 0812 91/77 98.2 F (36.8 C) Oral (!) 104 16 96 %   09/19/22 0809 91/77 -- -- -- -- --   09/19/22 0202 (!) 121/104 97.3 F (36.3 C) Axillary -- 17 --   09/18/22 2219 100/72 98.1 F (36.7 C) Oral 98 15 94 %   09/18/22 2111 109/74 98.1 F (36.7 C) -- (!) 103 15 99 %   09/18/22 1638 96/66 97.3 F  (36.3 C) Oral 99 18 98 %       Gen:    Not in distress  Chest: Clear lungs  CVS:   Regular rhythm.  No edema  Abd:  Soft, not distended, not tender      Discharge/Recent Laboratory Results:  Recent Labs     09/19/22  0358   NA 139   K 3.9   CL 109*   CO2 26   BUN 16   CREATININE 0.98   GLUCOSE 100   CALCIUM 8.7     Recent Labs     09/19/22  0358   HGB 14.5   HCT 44.9   WBC 4.0*   PLT 172       Discharge Medications:     Medication List        START taking these medications      albuterol sulfate HFA 108 (90 Base) MCG/ACT inhaler  Commonly known as: PROVENTIL;VENTOLIN;PROAIR  Inhale 2 puffs into the lungs every 6 hours as needed for Wheezing     aspirin 81 MG chewable tablet  Take 1 tablet by mouth daily  Start taking on: September 20, 2022     atorvastatin 80 MG tablet  Commonly known as: LIPITOR  Take 1 tablet by mouth nightly  carvedilol 3.125 MG tablet  Commonly known as: COREG  Take 1 tablet by mouth 2 times daily (with meals)     empagliflozin 10 MG tablet  Commonly known as: JARDIANCE  Take 1 tablet by mouth daily  Start taking on: September 20, 2022     furosemide 40 MG tablet  Commonly known as: LASIX  Take 1 tablet by mouth daily  Start taking on: September 20, 2022     sacubitril-valsartan 24-26 MG per tablet  Commonly known as: ENTRESTO  Take 1 tablet by mouth 2 times daily     spironolactone 25 MG tablet  Commonly known as: ALDACTONE  Take 1 tablet by mouth daily  Start taking on: September 20, 2022               Where to Get Your Medications        [id number] [place], [address]  [place], [place] [address]      Phone: [phone]   albuterol sulfate HFA 108 (90 Base) MCG/ACT inhaler  aspirin 81 MG chewable tablet  atorvastatin 80 MG tablet  carvedilol 3.125 MG tablet  empagliflozin 10 MG tablet  furosemide 40 MG tablet  sacubitril-valsartan 24-26 MG per tablet  spironolactone 25 MG tablet              DISPOSITION:    Home with Family:  x     Home with HH/PT/OT/RN:    SNF/LTC:    SAHR:    OTHER:            Code status: \  Recommended diet: cardiac diet  Recommended activity: activity as tolerated  Wound care: None      Follow up with:   PCP : No primary care provider on file.  Leda Gauze, MD  [place] [address]  [phone]    Follow up in 1 week(s)            Total time in minutes spent coordinating this discharge (includes going over instructions, follow-up, prescriptions, and preparing report for sign off to her PCP) :  35 minutes

## 2022-09-19 NOTE — Care Coordination-Inpatient (Signed)
Cm was notified by charge nurse - Caryl Pina pt needs a Lifevest and pt has no insurance.     Case discussed with CM Director via [employer]. Cardiologist orders the Lifevest. Lifevest will then reach out to [facility] director.

## 2022-09-19 NOTE — Progress Notes (Signed)
CARDIOLOGY PROGRESS NOTE      Patient Name: Paul Casey  Age: 52 y.o.  Gender:male  DOB:May 02, 1971  MRN: [id number]    Patient seen and examined. This is a patient with a history of asthma and previous tobacco abuse who presented with shortness of breath now being followed for acute heart failure with reduced EF. Feeling better today.  Breathing at baseline.  No chest pain.  No issues at cath site. No other complaints reported.    Telemetry reviewed, there were no events noted in the past 24 hours.    Pertinent review of systems items noted above, all other systems are negative. Current medications reviewed.    Physical Examination    No Known Allergies  Vitals:    09/19/22 0812   BP: 91/77   Pulse: (!) 104   Resp: 16   Temp: 98.2 F (36.8 C)   SpO2: 96%     Vital signs are stable  No apparent distress.  Heart has a regular rate and rhythm.  no murmur  Lungs are clear  Abdomen is soft, nontender, normal bowel sounds.  Extremities have no edema  Skin is dry and warm.  Normal affect    Labs reviewed:  Recent Results (from the past 12 hour(s))   CBC with Auto Differential    Collection Time: 09/19/22  3:58 AM   Result Value Ref Range    WBC 4.0 (L) 4.1 - 11.1 K/uL    RBC 5.25 4.10 - 5.70 M/uL    Hemoglobin 14.5 12.1 - 17.0 g/dL    Hematocrit 44.9 36.6 - 50.3 %    MCV 85.5 80.0 - 99.0 FL    MCH 27.6 26.0 - 34.0 PG    MCHC 32.3 30.0 - 36.5 g/dL    RDW 14.3 11.5 - 14.5 %    Platelets 172 150 - 400 K/uL    MPV 9.5 8.9 - 12.9 FL    Nucleated RBCs 0.0 0.0 PER 100 WBC    nRBC 0.00 0.00 - 0.01 K/uL    Neutrophils % 49 32 - 75 %    Lymphocytes % 36 12 - 49 %    Monocytes % 12 5 - 13 %    Eosinophils % 2 0 - 7 %    Basophils % 1 0 - 1 %    Immature Granulocytes 0 0 - 0.5 %    Neutrophils Absolute 2.0 1.8 - 8.0 K/UL    Lymphocytes Absolute 1.4 0.8 - 3.5 K/UL    Monocytes Absolute 0.5 0.0 - 1.0 K/UL    Eosinophils Absolute 0.1 0.0 - 0.4 K/UL    Basophils Absolute 0.0 0.0 - 0.1 K/UL    Absolute Immature Granulocyte 0.0 0.00 -  0.04 K/UL    Differential Type AUTOMATED     Comprehensive Metabolic Panel    Collection Time: 09/19/22  3:58 AM   Result Value Ref Range    Sodium 139 136 - 145 mmol/L    Potassium 3.9 3.5 - 5.1 mmol/L    Chloride 109 (H) 97 - 108 mmol/L    CO2 26 21 - 32 mmol/L    Anion Gap 4 (L) 5 - 15 mmol/L    Glucose 100 65 - 100 mg/dL    BUN 16 6 - 20 mg/dL    Creatinine 0.98 0.70 - 1.30 mg/dL    Bun/Cre Ratio 16 12 - 20      Est, Glom Filt Rate >60 >60 ml/min/1.48m2    Calcium 8.7 8.5 -  10.1 mg/dL    Total Bilirubin 0.7 0.2 - 1.0 mg/dL    AST 19 15 - 37 U/L    ALT 56 12 - 78 U/L    Alk Phosphatase 73 45 - 117 U/L    Total Protein 6.7 6.4 - 8.2 g/dL    Albumin 2.9 (L) 3.5 - 5.0 g/dL    Globulin 3.8 2.0 - 4.0 g/dL    Albumin/Globulin Ratio 0.8 (L) 1.1 - 2.2          Case discussed with Dr. Reesa Chew and our impression and recommendations are as follows:  CHF: Echo with EF 0-10%, dilated LV at [facility]  Cath with normal coronary arteries  Will continue GDMT with Entresto tier 1, BB, Spiro, Jardiance  Continue Lasix 40mg  PO daily  Continue ASA  Discussed LifeVest as primary prevention of SCD given low EF. Patient would like to proceed.  Will repeat echo in 90 days after maximally tolerated med regimen and plan for ICD if EF remains below 35%.  Will need cardiac MRI as OP  Multivalvular regurgitation: calcification noted. Mild to mod AVR, mild MR. Will monitor with serial echos as outpatient.   Hyperlipidemia: LDL 114, continue statin therapy   Asthma: per primary, albuterol PRN  Tobacco abuse: Needs encouraged continued cessation     Please do not hesitate to call if additional questions arise.    Rudean Haskell, APRN - NP  09/19/2022    M. Maui Ahart Addendum:  Agree with findings and plan noted above.  Doing better, acceptable for discharge.

## 2022-09-23 LAB — CARDIAC PROCEDURE: Body Surface Area: 1.99 m2

## 2022-09-28 IMAGING — DX DG LUMBAR SPINE COMPLETE 4+V
5 series · 5 of 5 positions shown · non-contrast
Comparison: None.

CLINICAL DATA: Chronic low back pain

EXAM:
LUMBAR SPINE - COMPLETE 4+ VIEW

[l-spine ap]
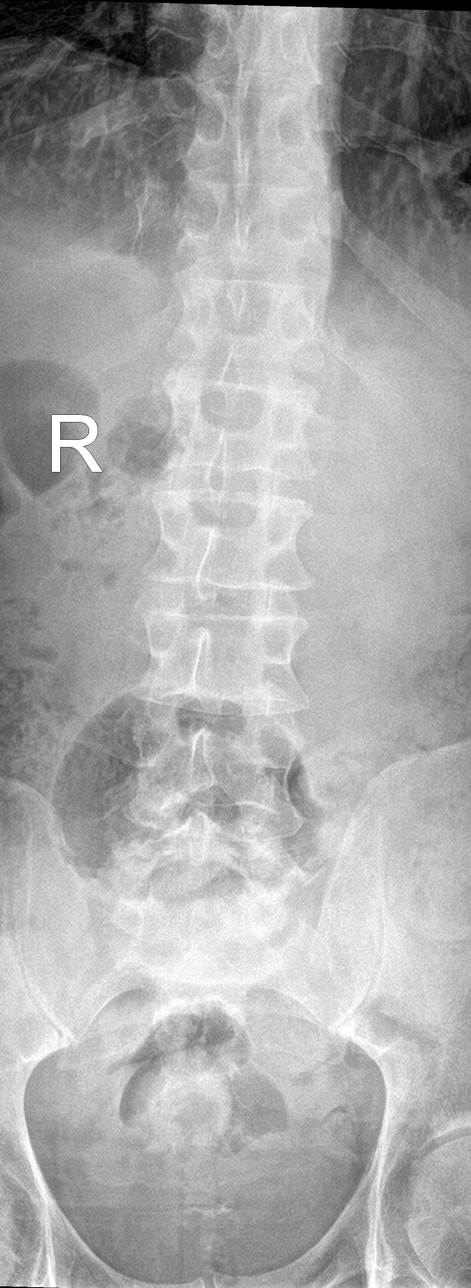

[l-spine obl (1 of 2)]
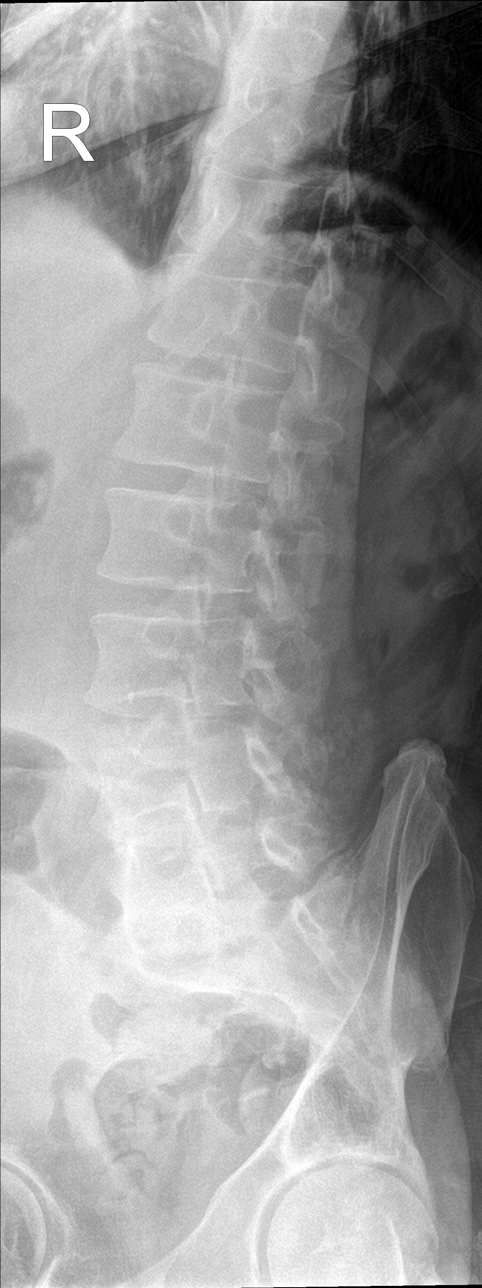

[l-spine obl (2 of 2)]
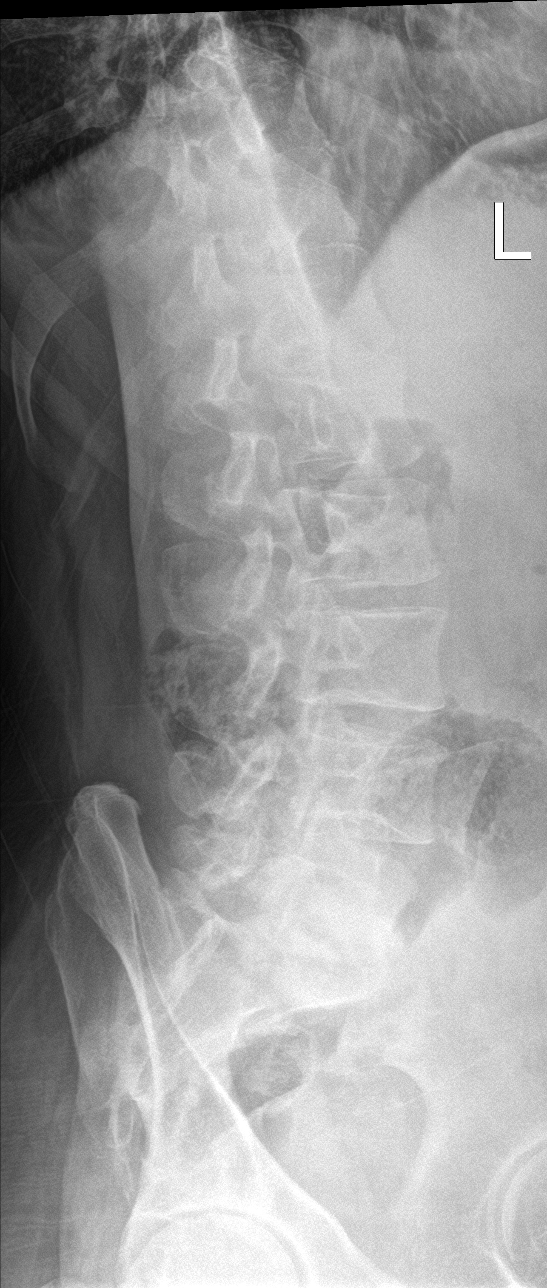

[l-spine lat (1 of 2)]
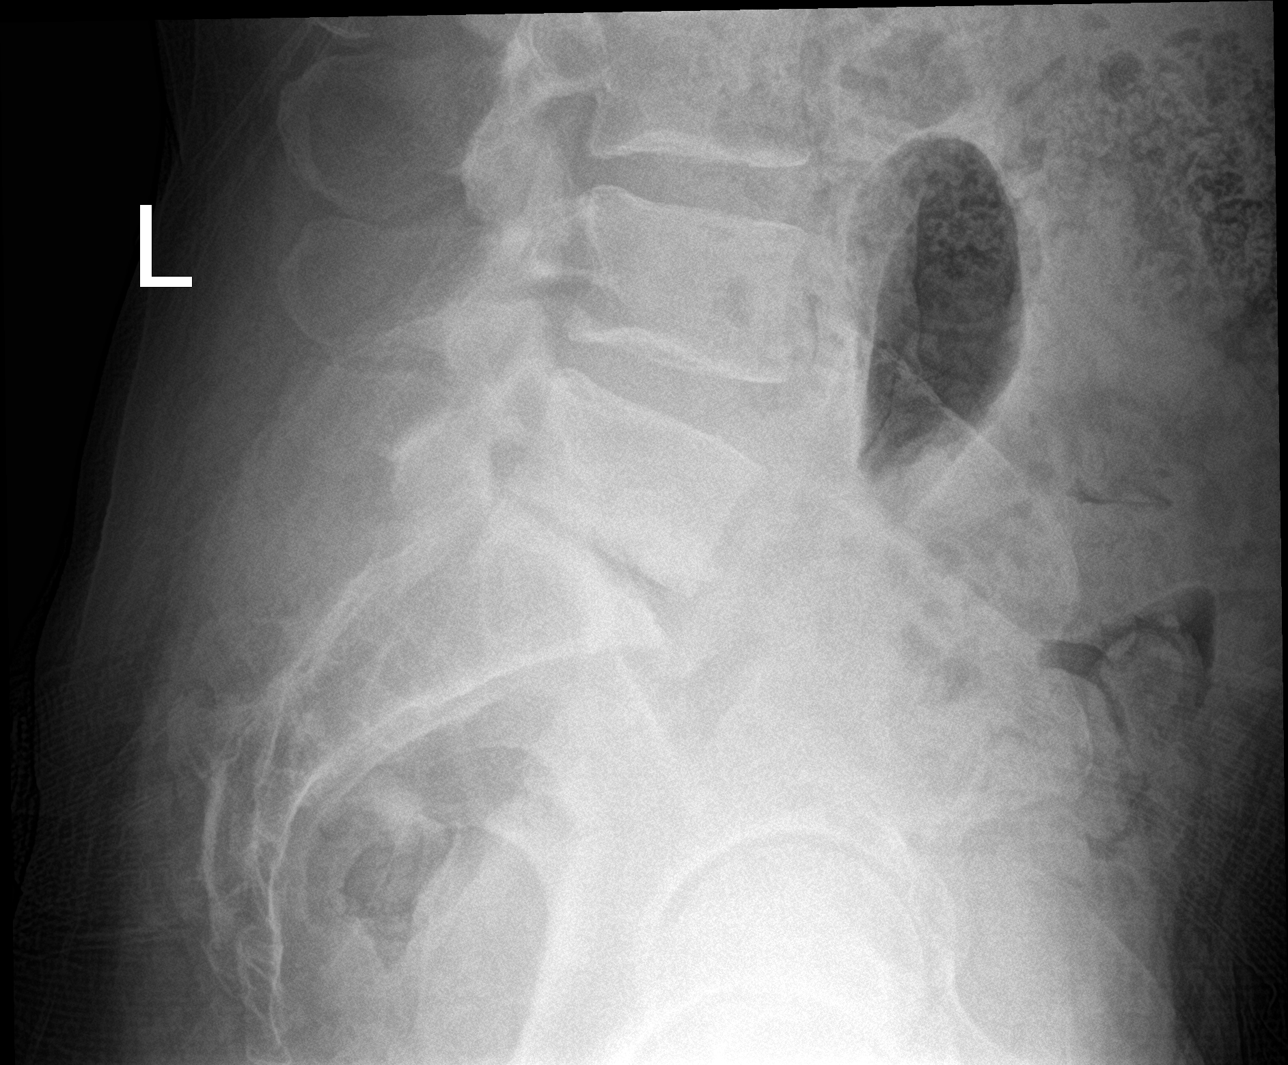

[l-spine lat (2 of 2)]
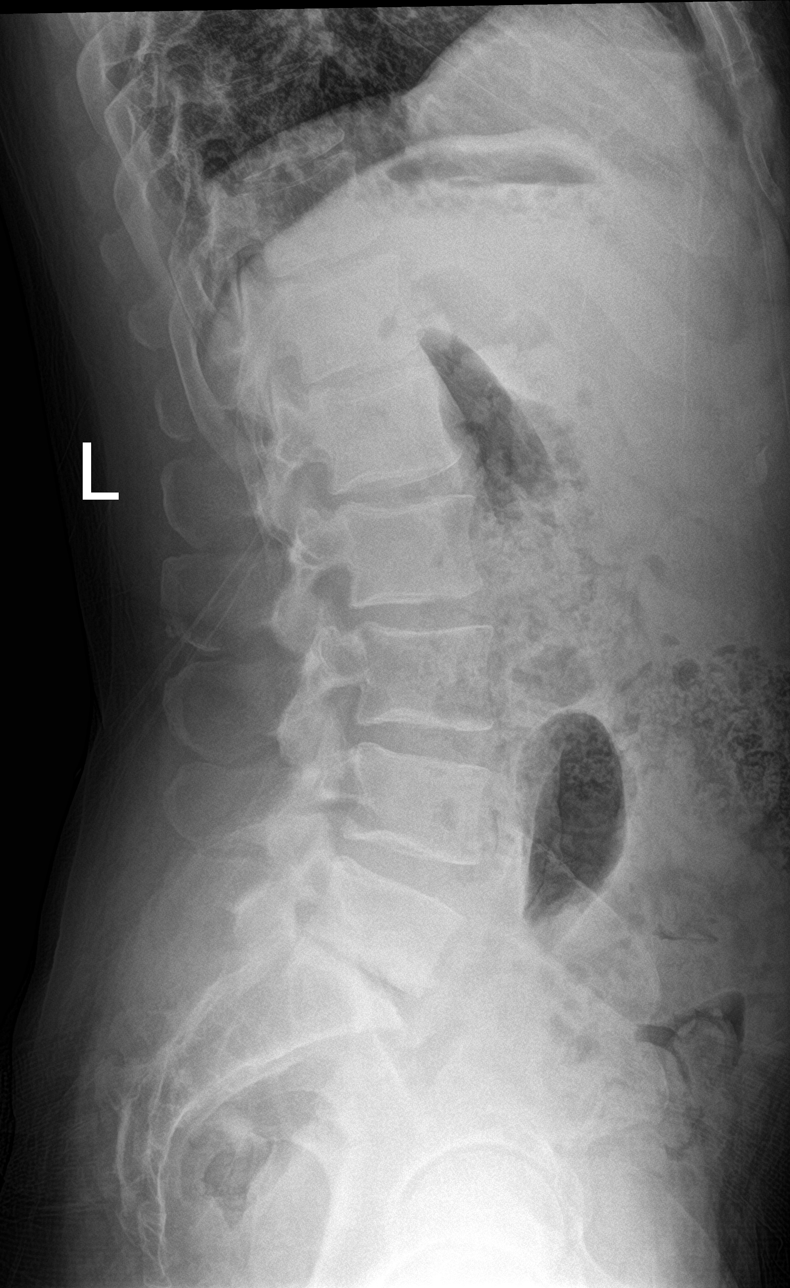

[5 of 5 positions shown; findings below may reference images not displayed]

FINDINGS: Normal lumbar lordosis. No acute fracture or listhesis of the lumbar
spine. Vertebral body height is preserved. There is intervertebral
disc space narrowing and endplate remodeling at L5-S1 in keeping
with changes of advanced degenerative disc disease at this level.
Remaining intervertebral disc heights are preserved. Oblique views
demonstrate no evidence of pars defect. Paraspinal soft tissues are
unremarkable.
IMPRESSION: Advanced degenerative disc disease L5-S1.

## 2022-09-29 NOTE — Telephone Encounter (Signed)
RN-NN LVM for call back.

## 2022-10-01 NOTE — Telephone Encounter (Signed)
Received a call back. Patient denies new symptoms. Patient is working. Patient does state he is weighing himself, but does not remember his weights at this time.

## 2022-10-13 ENCOUNTER — Other Ambulatory Visit: Payer: Medicaid Other

## 2022-10-13 DIAGNOSIS — Z79899 Other long term (current) drug therapy: Secondary | ICD-10-CM | POA: Diagnosis not present

## 2022-10-13 DIAGNOSIS — E118 Type 2 diabetes mellitus with unspecified complications: Secondary | ICD-10-CM | POA: Diagnosis not present

## 2022-10-13 DIAGNOSIS — E782 Mixed hyperlipidemia: Secondary | ICD-10-CM

## 2022-10-13 DIAGNOSIS — R972 Elevated prostate specific antigen [PSA]: Secondary | ICD-10-CM

## 2022-10-15 LAB — CBC WITH DIFFERENTIAL/PLATELET
Basophils Absolute: 0.1 10*3/uL (ref 0.0–0.2)
Basos: 1 %
EOS (ABSOLUTE): 0.4 10*3/uL (ref 0.0–0.4)
Eos: 3 %
Hematocrit: 47.7 % (ref 37.5–51.0)
Hemoglobin: 15.4 g/dL (ref 13.0–17.7)
Immature Grans (Abs): 0.1 10*3/uL (ref 0.0–0.1)
Immature Granulocytes: 1 %
Lymphocytes Absolute: 4.6 10*3/uL — ABNORMAL HIGH (ref 0.7–3.1)
Lymphs: 36 %
MCH: 27.7 pg (ref 26.6–33.0)
MCHC: 32.3 g/dL (ref 31.5–35.7)
MCV: 86 fL (ref 79–97)
Monocytes Absolute: 0.7 10*3/uL (ref 0.1–0.9)
Monocytes: 5 %
Neutrophils Absolute: 7.1 10*3/uL — ABNORMAL HIGH (ref 1.4–7.0)
Neutrophils: 54 %
Platelets: 203 10*3/uL (ref 150–450)
RBC: 5.55 x10E6/uL (ref 4.14–5.80)
RDW: 13.3 % (ref 11.6–15.4)
WBC: 12.9 10*3/uL — ABNORMAL HIGH (ref 3.4–10.8)

## 2022-10-15 LAB — COMPREHENSIVE METABOLIC PANEL
ALT: 20 IU/L (ref 0–44)
AST: 13 IU/L (ref 0–40)
Albumin/Globulin Ratio: 1.5 (ref 1.2–2.2)
Albumin: 4.2 g/dL (ref 3.8–4.9)
Alkaline Phosphatase: 127 IU/L — ABNORMAL HIGH (ref 44–121)
BUN/Creatinine Ratio: 10 (ref 9–20)
BUN: 9 mg/dL (ref 6–24)
Bilirubin Total: 0.5 mg/dL (ref 0.0–1.2)
CO2: 22 mmol/L (ref 20–29)
Calcium: 9.8 mg/dL (ref 8.7–10.2)
Chloride: 103 mmol/L (ref 96–106)
Creatinine, Ser: 0.92 mg/dL (ref 0.76–1.27)
Globulin, Total: 2.8 g/dL (ref 1.5–4.5)
Glucose: 306 mg/dL — ABNORMAL HIGH (ref 70–99)
Potassium: 4.6 mmol/L (ref 3.5–5.2)
Sodium: 138 mmol/L (ref 134–144)
Total Protein: 7 g/dL (ref 6.0–8.5)
eGFR: 100 mL/min/{1.73_m2} (ref >59)

## 2022-10-15 LAB — HEMOGLOBIN A1C
Est. average glucose Bld gHb Est-mCnc: 315 mg/dL
Hgb A1c MFr Bld: 12.6 % — ABNORMAL HIGH (ref 4.8–5.6)

## 2022-10-15 LAB — LIPID PANEL W/O CHOL/HDL RATIO
Cholesterol, Total: 148 mg/dL (ref 100–199)
HDL: 50 mg/dL (ref >39)
LDL Chol Calc (NIH): 79 mg/dL (ref 0–99)
Triglycerides: 104 mg/dL (ref 0–149)
VLDL Cholesterol Cal: 19 mg/dL (ref 5–40)

## 2022-10-15 LAB — MICROALBUMIN / CREATININE URINE RATIO
Creatinine, Urine: 193.7 mg/dL
Microalb/Creat Ratio: 45 mg/g creat — ABNORMAL HIGH (ref 0–29)
Microalbumin, Urine: 88.1 ug/mL

## 2022-10-15 LAB — PSA: Prostate Specific Ag, Serum: 5.2 ng/mL — ABNORMAL HIGH (ref 0.0–4.0)

## 2022-10-16 ENCOUNTER — Other Ambulatory Visit: Payer: Medicaid Other

## 2022-10-19 ENCOUNTER — Other Ambulatory Visit: Payer: Self-pay

## 2022-10-19 ENCOUNTER — Encounter (HOSPITAL_COMMUNITY): Payer: Self-pay

## 2022-10-19 ENCOUNTER — Emergency Department (HOSPITAL_COMMUNITY): Payer: Medicaid Other

## 2022-10-19 ENCOUNTER — Emergency Department (HOSPITAL_COMMUNITY)
Admission: EM | Admit: 2022-10-19 | Discharge: 2022-10-20 | Disposition: A | Discharge status: Home / Self Care | Payer: Medicaid Other | Attending: Emergency Medicine | Admitting: Emergency Medicine

## 2022-10-19 DIAGNOSIS — Z7984 Long term (current) use of oral hypoglycemic drugs: Secondary | ICD-10-CM | POA: Insufficient documentation

## 2022-10-19 DIAGNOSIS — M25551 Pain in right hip: Secondary | ICD-10-CM

## 2022-10-19 DIAGNOSIS — Z7982 Long term (current) use of aspirin: Secondary | ICD-10-CM | POA: Insufficient documentation

## 2022-10-19 DIAGNOSIS — Z794 Long term (current) use of insulin: Secondary | ICD-10-CM | POA: Diagnosis not present

## 2022-10-19 DIAGNOSIS — E119 Type 2 diabetes mellitus without complications: Secondary | ICD-10-CM | POA: Insufficient documentation

## 2022-10-19 MED ORDER — KETOROLAC TROMETHAMINE 15 MG/ML IJ SOLN
30.0000 mg | Freq: Once | INTRAMUSCULAR | Status: AC
  Administered 2022-10-20: 30 mg via INTRAMUSCULAR
  Filled 2022-10-19: qty 2

## 2022-10-19 MED ORDER — IBUPROFEN 400 MG PO TABS: 400.0000 mg | ORAL_TABLET | Freq: Three times a day (TID) | ORAL | 0 refills | Status: AC | PRN

## 2022-10-19 NOTE — ED Triage Notes (Signed)
Right hip pain going on 4 days.   Hx of arthritis.

## 2022-10-19 NOTE — ED Provider Triage Note (Signed)
Emergency Medicine Provider Triage Evaluation Note  Ryan Hensley , a 52 y.o. male  was evaluated in triage.  Pt complains of right hip pain since Friday.  No falls.  Says that the pain is intolerable.  Says that it is worse with any type of movement and that he can barely walk  Review of Systems  Positive:  Negative:   Physical Exam  BP 112/73   Pulse (!) 101   Temp 98.7 F (37.1 C) (Oral)   Resp 18   Ht '5\' 8"'$  (1.727 m)   Wt 78.9 kg   SpO2 94%   BMI 26.46 kg/m  Gen:   Awake, no distress   Resp:  Normal effort  MSK:   Moves extremities without difficulty  Other:    Medical Decision Making  Medically screening exam initiated at 9:31 PM.  Appropriate orders placed.  Ryan Hensley was informed that the remainder of the evaluation will be completed by another provider, this initial triage assessment does not replace that evaluation, and the importance of remaining in the ED until their evaluation is complete.     Rhae Hammock, [place] 10/19/22 2131

## 2022-10-20 NOTE — ED Provider Notes (Signed)
[facility] Provider Note   CSN: [id number] Arrival date & time: 10/19/22  2104     History  Chief Complaint  Patient presents with   Hip Pain    Ryan Hensley is a 52 y.o. male.  The history is provided by the patient.  Patient w/history of diabetes presents with right hip pain.  He also reports back pain that is chronic.  No falls or trauma.  Patient reports that over the past several days he had increasing pain in the right hip worse with ambulation.  He reports he is able to walk, usually has to use a cane.  No new weakness.  No incontinence is reported.  No chest pain or abdominal pain.    Past Medical History:  Diagnosis Date   Diabetes mellitus without complication (HCC)    GERD (gastroesophageal reflux disease)    Hyperlipidemia     Home Medications Prior to Admission medications   Medication Sig Start Date End Date Taking? Authorizing Provider  ibuprofen (ADVIL) 400 MG tablet Take 1 tablet (400 mg total) by mouth every 8 (eight) hours as needed for moderate pain. 10/19/22  Yes Ripley Fraise, MD  acetaminophen (TYLENOL) 500 MG tablet Take 500 mg by mouth every 6 (six) hours as needed. Takes 2 tablet once a day    [provider]  AgaMatrix Ultra-Thin Lancets MISC Check blood glucose twice daily before meals 01/14/22   Mack Hook, MD  aspirin EC 81 MG tablet 1 tab by mouth daily with morning meal Patient not taking: Reported on 01/14/2022 12/13/18   Mack Hook, MD  atorvastatin (LIPITOR) 80 MG tablet 1 tab by mouth daily with evening meal 01/14/22   Mack Hook, MD  Blood Glucose Monitoring Suppl (AGAMATRIX PRESTO) w/Device KIT Check blood sugars twice daily before meals 01/14/22   Mack Hook, MD  famotidine (PEPCID) 20 MG tablet 2 tabs by mouth at bedtime 01/14/22   Mack Hook, MD  finasteride (PROSCAR) 5 MG tablet Take 1 tablet (5 mg total) by mouth daily. 02/25/22   Mack Hook, MD  gabapentin (NEURONTIN) 100 MG capsule Continue 3 caps by mouth at bedtime and add another 1 cap in morning and increase by 1 cap every 3 days until taking 3 caps 3 times daily 07/22/22   Mack Hook, MD  glipiZIDE (GLUCOTROL) 5 MG tablet 1 tab by mouth twice daily with meals 01/14/22   Mack Hook, MD  glucose blood (AGAMATRIX PRESTO TEST) test strip Check blood blood sugar twice daily before meals 01/14/22   Mack Hook, MD  insulin glargine (LANTUS SOLOSTAR) 100 UNIT/ML Solostar Pen 15 units injected subcutaneously daily in the morning before breakfast 01/14/22   Mack Hook, MD  metFORMIN (GLUCOPHAGE) 1000 MG tablet Take 1 tablet (1,000 mg total) by mouth 2 (two) times daily with a meal. 01/14/22   Mack Hook, MD  metoCLOPramide (REGLAN) 5 MG tablet 1 tab by mouth before meal 3 times daily. 02/25/22   Mack Hook, MD  tamsulosin [facility]) 0.4 MG CAPS capsule 1 tab by mouth with evening meal. 02/25/22   Mack Hook, MD  vitamin B-12 (CYANOCOBALAMIN) 50 MCG tablet Take 50 mcg by mouth. 1 twice a day    [provider]      Allergies    Shellfish allergy    Review of Systems   Review of Systems  Constitutional:  Negative for fever.  Musculoskeletal:  Positive for arthralgias and back pain.    Physical  Exam Updated Vital Signs BP 117/71   Pulse 91   Temp 98.1 F (36.7 C) (Oral)   Resp 18   Ht 1.727 m ('5\' 8"'$ )   Wt 78.9 kg   SpO2 96%   BMI 26.46 kg/m  Physical Exam CONSTITUTIONAL: Well developed/well nourished, sleeping on arrival to bedside HEAD: Normocephalic/atraumatic ENMT: Mucous membranes moist NECK: supple no meningeal signs SPINE/BACK:entire spine nontender No bruising/crepitance/stepoffs noted to spine ABDOMEN: soft, nontender GU:no cva tenderness NEURO: Pt is awake/alert/appropriate, moves all extremitiesx4.  No facial droop.  He is able move both legs without difficulty EXTREMITIES: pulses  normal/equal, full ROM Tenderness with range of motion of right hip.  No deformities.  Distal pulses equal and intact.  No erythema or bruising noted over the right hip SKIN: warm, color normal PSYCH: no abnormalities of mood noted, alert and oriented to situation  ED Results / Procedures / Treatments   Labs (all labs ordered are listed, but only abnormal results are displayed) Labs Reviewed - No data to display  EKG None  Radiology DG Hip Unilat W or Wo Pelvis 2-3 Views Right  Result Date: 10/19/2022 CLINICAL DATA:  Pain EXAM: DG HIP (WITH OR WITHOUT PELVIS) 2-3V RIGHT COMPARISON:  None Available. FINDINGS: There are moderate degenerative changes of the right hip and mild degenerative changes of the left hip with joint space narrowing, sclerosis and osteophyte formation. There is no acute fracture or dislocation identified. Peripheral vascular calcifications are present IMPRESSION: 1. No acute bony abnormality. 2. Moderate degenerative changes of the right hip and mild degenerative changes of the left hip. Electronically Signed   By: Ronney Asters M.D.   On: 10/19/2022 21:54    Procedures Procedures    Medications Ordered in ED Medications  ketorolac (TORADOL) 15 MG/ML injection 30 mg (30 mg Intramuscular Given 10/20/22 0003)    ED Course/ Medical Decision Making/ A&P                             Medical Decision Making Risk Prescription drug management.   Patient presents with chronic back pain and now having worsening right hip pain.  No signs of recent trauma.  Patient has moderate degenerative changes on x-ray.  He has been ambulatory.  Will place on anti-inflammatories and refer to orthopedics. No signs of septic joint or other emergent process        Final Clinical Impression(s) / ED Diagnoses Final diagnoses:  Right hip pain    Rx / DC Orders ED Discharge Orders          Ordered    ibuprofen (ADVIL) 400 MG tablet  Every 8 hours PRN        10/19/22 2350               Ripley Fraise, MD 10/20/22 [phone]

## 2022-10-22 ENCOUNTER — Ambulatory Visit: Payer: Medicaid Other | Admitting: Internal Medicine

## 2022-10-22 ENCOUNTER — Encounter: Payer: Self-pay | Admitting: Internal Medicine

## 2022-10-22 VITALS — BP 92/64 | HR 80 | Resp 16 | Ht 67.75 in | Wt 167.0 lb

## 2022-10-22 DIAGNOSIS — Z23 Encounter for immunization: Secondary | ICD-10-CM | POA: Diagnosis not present

## 2022-10-22 DIAGNOSIS — Z1211 Encounter for screening for malignant neoplasm of colon: Secondary | ICD-10-CM

## 2022-10-22 DIAGNOSIS — R972 Elevated prostate specific antigen [PSA]: Secondary | ICD-10-CM | POA: Diagnosis not present

## 2022-10-22 DIAGNOSIS — N401 Enlarged prostate with lower urinary tract symptoms: Secondary | ICD-10-CM | POA: Diagnosis not present

## 2022-10-22 DIAGNOSIS — E118 Type 2 diabetes mellitus with unspecified complications: Secondary | ICD-10-CM

## 2022-10-22 DIAGNOSIS — S0592XS Unspecified injury of left eye and orbit, sequela: Secondary | ICD-10-CM

## 2022-10-22 DIAGNOSIS — R3916 Straining to void: Secondary | ICD-10-CM

## 2022-10-22 DIAGNOSIS — Z114 Encounter for screening for human immunodeficiency virus [HIV]: Secondary | ICD-10-CM

## 2022-10-22 DIAGNOSIS — Z1159 Encounter for screening for other viral diseases: Secondary | ICD-10-CM

## 2022-10-22 MED ORDER — GABAPENTIN 100 MG PO CAPS: ORAL_CAPSULE | ORAL | 11 refills | Status: DC

## 2022-10-22 MED ORDER — EMPAGLIFLOZIN 10 MG PO TABS: 10.0000 mg | ORAL_TABLET | Freq: Every day | ORAL | 11 refills | Status: DC

## 2022-10-22 MED ORDER — FINASTERIDE 5 MG PO TABS: 5.0000 mg | ORAL_TABLET | Freq: Every day | ORAL | 3 refills | Status: DC

## 2022-10-22 NOTE — Patient Instructions (Signed)
Gabapentin: Start taking Gabapentin 100 mg cap 1 cap at bedtime. In 3 days, increase to 2 caps at bedtime. In another 3 days, increase to 3 caps at bedtime You should be taking 3 caps at bedtime at this point.  In 3 days, start another 1 cap in the morning In 3 more days, increase to 2 caps in the morning In 3 more days, increase to 3 caps in the morning:  You should be taking 3 caps in the morning and 3 caps at bedtime at this point.  In 3 days, start another dose midday--1 cap In 3 more days, increase to 2 caps midday In 3 more days, increase to 3 caps midday. You should be taking 3 caps 3 times daily at this point.  Stay on this dose until follow up If you do not tolerate increasing the dose at any point, hold on the dose you tolerate or call clinic if having problems

## 2022-10-22 NOTE — Progress Notes (Signed)
Subjective:    Patient ID: Ryan Hensley, male   DOB: 1971/03/09, 52 y.o.   MRN: [id number]   HPI   DM:  Was out of meds for about 1 month or possibly more before received Medicaid and filled everything on 10/13/2022.  He states he has been taking everything regularly since.  A1C recently still quite high at 12.6%.  Have not seen what his sugars do with consistent use of meds yet.  He will be seen for disability for his left eye by medicaid approved doctor soon, but has also not had a diabetic eye exam in recent years.  2.  BPH and increased PSA:  Mildly elevated at 4.9 in July.  Difficulties with followup.  Rechecked recently and at 5.2.  He now has Medicaid and able to keep up with meds--has not been able to afford Finasteride.  Has been taking Tamsulosin for a while.  Nocturia once nightly.  He does have urinary hesitation.  He limits fluids so he does not have to urinate, so not clear if he would have more difficulty if drank water appropriately.  Has not been to Urology.   No family history of prostate cancer.    3.  Hyperlipidemia:  Cholesterol improved as one of the two meds he has been taking is his Atorvastatin.  He is hooked in with food pantries and eating healthier as well.    Lipid Panel     Component Value Date/Time   CHOL 148 10/13/2022 0924   TRIG 104 10/13/2022 0924   HDL 50 10/13/2022 0924   LDLCALC 79 10/13/2022 0924   LABVLDL 19 10/13/2022 0924     4.  Mild microalbuminuria:  He does have low normal BP, however, so limited on ACEI or ARB at this time.    5.  Low back pain/hip pain:  to be seen by ortho.  With regards to his peripheral neuropathy, he never picked up his gabapentin.  He is willing to do so now with coverage.  6.  HM:  states received flu and covid vaccine this fall here with [facility].  Current Meds  Medication Sig   AgaMatrix Ultra-Thin Lancets MISC Check blood glucose twice daily before meals   aspirin EC 81 MG tablet 1 tab by mouth daily with  morning meal   atorvastatin (LIPITOR) 80 MG tablet 1 tab by mouth daily with evening meal   Blood Glucose Monitoring Suppl (AGAMATRIX PRESTO) w/Device KIT Check blood sugars twice daily before meals   famotidine (PEPCID) 20 MG tablet 2 tabs by mouth at bedtime   glipiZIDE (GLUCOTROL) 5 MG tablet 1 tab by mouth twice daily with meals   glucose blood (AGAMATRIX PRESTO TEST) test strip Check blood blood sugar twice daily before meals   metFORMIN (GLUCOPHAGE) 1000 MG tablet Take 1 tablet (1,000 mg total) by mouth 2 (two) times daily with a meal.   metoCLOPramide (REGLAN) 5 MG tablet 1 tab by mouth before meal 3 times daily.   tamsulosin (FLOMAX) 0.4 MG CAPS capsule 1 tab by mouth with evening meal.   vitamin B-12 (CYANOCOBALAMIN) 50 MCG tablet Take 50 mcg by mouth. 1 a day   Allergies  Allergen Reactions   Shellfish Allergy Swelling     Review of Systems    Objective:   BP 92/64 (BP Location: Right Arm, Patient Position: Sitting, Cuff Size: Normal)   Pulse 80   Resp 16   Ht 5' 7.75" (1.721 m)   Wt 167 lb (75.8 kg)  BMI 25.58 kg/m   Physical Exam NAD HEENT:  PERRL, EOMI, Disc appears normal on right.  Left cornea with thick white scarring.   TMs pearly gray, throat without injection. Neck:  Supple, No adenopathy, no thyromegaly Chest:  CTA CV:  RRR with normal S1 and S2, no S3, S4 or murmur.  No carotid bruits.  Carotid, radial and DP pulses normal and equal.   Abd:  S, NT, No HSM or mass + BS LE:  No edema.  Diabetic Foot Exam - Simple   Simple Foot Form  10/22/2022 11:00 AM  Visual Inspection No deformities, no ulcerations, no other skin breakdown bilaterally: Yes See comments: Yes Sensation Testing Intact to touch and monofilament testing bilaterally: Yes Pulse Check Posterior Tibialis and Dorsalis pulse intact bilaterally: Yes Comments Flaking feet.       Assessment & Plan   DM:  poor control.  Hopefully, now with Medicaid, he will be better able to afford  meds and take regularly.  Adding Jardiance.  Dexcom continuous glucose monitor ordered.  Repeat A1C in 3 months.  Referral to Dr. Katy Fitch for diabetic eye exam.  He is having evaluation from another eye specialist to obtain disability.  2.  Hyperlipidemia:  better control.  Would like to see LDL below 70.  Continue to work on diet and physical activity.    3.  Microalbuminuria:  Adding Jardiance.  Hold on ACE I or ARB with low normal BP for now.    4.  BPH with elevated PSA:  PSA up a bit more.  Recheck with free PSA as well.  Continue Tamsulosin and restart Finasteride.  Urology referral for increasing PSA.    5.  Low back pain/hip pain and peripheral neuropathy:  Gabapentin titration re attempt.  Ortho evaluation planned.    6.  HM:  Shingrix #1/2.  Referral for screening colonoscopy.  Screen for HIV and Hep C.

## 2022-10-23 LAB — PSA, TOTAL AND FREE
PSA, Free Pct: 9.1 %
PSA, Free: 0.51 ng/mL
Prostate Specific Ag, Serum: 5.6 ng/mL — ABNORMAL HIGH (ref 0.0–4.0)

## 2022-10-23 LAB — HIV ANTIBODY (ROUTINE TESTING W REFLEX): HIV Screen 4th Generation wRfx: NONREACTIVE

## 2022-10-23 LAB — HEPATITIS C ANTIBODY: Hep C Virus Ab: NONREACTIVE

## 2022-10-27 ENCOUNTER — Encounter

## 2022-10-28 ENCOUNTER — Ambulatory Visit (INDEPENDENT_AMBULATORY_CARE_PROVIDER_SITE_OTHER): Payer: Medicaid Other | Admitting: Orthopaedic Surgery

## 2022-10-28 ENCOUNTER — Telehealth: Payer: Self-pay

## 2022-10-28 DIAGNOSIS — M1611 Unilateral primary osteoarthritis, right hip: Secondary | ICD-10-CM

## 2022-10-28 MED ORDER — DICLOFENAC SODIUM 75 MG PO TBEC: 75.0000 mg | DELAYED_RELEASE_TABLET | Freq: Two times a day (BID) | ORAL | 2 refills | Status: DC

## 2022-10-28 NOTE — Progress Notes (Signed)
Office Visit Note   Patient: Ryan Hensley           Date of Birth: Feb 05, 1971           MRN: [id number] Visit Date: 10/28/2022              Requested by: Ryan Hook, MD [place],  [place] [address] PCP: Ryan Hook, MD   Assessment & Plan: Visit Diagnoses:  1. Primary osteoarthritis of right hip     Plan: Ryan Hensley has fairly advanced right hip osteoarthritis which is basically bone-on-bone.  Disease process and treatment options were reviewed in detail.  Currently his diabetes is uncontrolled with the most recent A1c of 12.6.  Once the A1c has been corrected we can consider steroid injections or more practically and realistically speaking we can consider a total hip replacement which would be the best option for him.  He understands all this.  Questions encouraged and answered.  He will follow-up with his primary care doctor about getting the diabetes under better control.  Follow-Up Instructions: No follow-ups on file.   Orders:  No orders of the defined types were placed in this encounter.  No orders of the defined types were placed in this encounter.     Procedures: No procedures performed   Clinical Data: No additional findings.   Subjective: Chief Complaint  Patient presents with   Right Hip - Pain    HPI  Ryan Hensley is a very pleasant 52 year old gentleman here for evaluation of chronic severe right hip pain that started last Thursday.  Denies any injuries.  He states that he is had right hip pain on and off for years but never this bad.  He is barely able to put pressure on his right leg.  He is currently using crutches at home.  He currently lives with his brother.  Review of Systems  Constitutional: Negative.   HENT: Negative.    Eyes: Negative.   Respiratory: Negative.    Cardiovascular: Negative.   Gastrointestinal: Negative.   Endocrine: Negative.   Genitourinary: Negative.   Skin: Negative.   Allergic/Immunologic: Negative.    Neurological: Negative.   Hematological: Negative.   Psychiatric/Behavioral: Negative.    All other systems reviewed and are negative.    Objective: Vital Signs: There were no vitals taken for this visit.  Physical Exam Vitals and nursing note reviewed.  Constitutional:      Appearance: He is well-developed.  HENT:     Head: Normocephalic and atraumatic.  Eyes:     Pupils: Pupils are equal, round, and reactive to light.  Pulmonary:     Effort: Pulmonary effort is normal.  Abdominal:     Palpations: Abdomen is soft.  Musculoskeletal:        General: Normal range of motion.     Cervical back: Neck supple.  Skin:    General: Skin is warm.  Neurological:     Mental Status: He is alert and oriented to person, place, and time.  Psychiatric:        Behavior: Behavior normal.        Thought Content: Thought content normal.        Judgment: Judgment normal.     Ortho Exam  Examination of right hip shows reproducible pain with hip motion.  Slight limitation range of motion.  Specialty Comments:  No specialty comments available.  Imaging: No results found.   PMFS History: Patient Active Problem List   Diagnosis Date Noted  Lumbar back pain with radiculopathy affecting right lower extremity 02/25/2022   Food insecurity 02/25/2022   Cutaneous abscess of back (any part, except buttock) 12/19/2018   Gastroesophageal reflux disease 12/13/2018   Mixed hyperlipidemia 12/13/2018   Diabetic peripheral neuropathy ([place]) 12/13/2018   Diabetic retinopathy associated with type 2 diabetes mellitus ([place]) 12/13/2018   Type 2 diabetes mellitus with complication, without long-term current use of insulin ([place]) 12/13/2018   Left eye injury, sequela 1978   Past Medical History:  Diagnosis Date   Diabetes mellitus without complication ([place])    GERD (gastroesophageal reflux disease)    Hyperlipidemia    Left eye injury, sequela 1978    Family History  Problem Relation Age of Onset    Hypertension Mother     Past Surgical History:  Procedure Laterality Date   boxer fracture ORIF Right 2016   EYE SURGERY  2007   Strabismus surgery, left eye.  Age 41 yo.   Social History   Occupational History   Not on file  Tobacco Use   Smoking status: Every Day    Packs/day: 0.25    Years: 31.00    Total pack years: 7.75    Types: Cigarettes   Smokeless tobacco: Never  Vaping Use   Vaping Use: Never used  Substance and Sexual Activity   Alcohol use: Yes    Comment: 3 mixed drinks per month   Drug use: No   Sexual activity: Not on file

## 2022-10-28 NOTE — Telephone Encounter (Signed)
Confirmed with [facility] that their next available new patient appointment is in August. Patient would like to be seen at a different specialist

## 2022-10-29 ENCOUNTER — Telehealth: Payer: Self-pay | Admitting: Internal Medicine

## 2022-10-30 DIAGNOSIS — N401 Enlarged prostate with lower urinary tract symptoms: Secondary | ICD-10-CM | POA: Insufficient documentation

## 2022-10-30 DIAGNOSIS — R972 Elevated prostate specific antigen [PSA]: Secondary | ICD-10-CM | POA: Insufficient documentation

## 2022-10-30 MED ORDER — DEXCOM G6 TRANSMITTER MISC: 1.0000 [IU] | Freq: Three times a day (TID) | 3 refills | Status: DC

## 2022-10-30 MED ORDER — DEXCOM G6 SENSOR MISC: 11 refills | Status: DC

## 2022-10-30 NOTE — Telephone Encounter (Signed)
No--he does not need to bring in.  I can see the evaluation in his chart.  We need to get his diabetes under control before he can have anything done.   Also let him know his HIV and Hep C were negative. His prostate cancer testing is still elevated, so make sure he is getting contacted by urology for an appt.and taking all of his meds--including for the enlarged prostate and diabetes.  I reordered meds he wasn't taking as well as starting new ones at last visit.

## 2022-11-03 NOTE — Telephone Encounter (Signed)
Notified patient of message from Dr. Amil Amen on 11/03/2022

## 2022-11-24 ENCOUNTER — Encounter: Payer: Self-pay | Admitting: Gastroenterology

## 2023-01-06 ENCOUNTER — Ambulatory Visit

## 2023-01-19 ENCOUNTER — Other Ambulatory Visit: Payer: Self-pay | Admitting: Internal Medicine

## 2023-01-25 ENCOUNTER — Other Ambulatory Visit (INDEPENDENT_AMBULATORY_CARE_PROVIDER_SITE_OTHER): Payer: Medicaid Other

## 2023-01-25 DIAGNOSIS — E782 Mixed hyperlipidemia: Secondary | ICD-10-CM

## 2023-01-25 DIAGNOSIS — E118 Type 2 diabetes mellitus with unspecified complications: Secondary | ICD-10-CM | POA: Diagnosis not present

## 2023-01-26 LAB — LIPID PANEL W/O CHOL/HDL RATIO
Cholesterol, Total: 146 mg/dL (ref 100–199)
HDL: 43 mg/dL (ref >39)
LDL Chol Calc (NIH): 83 mg/dL (ref 0–99)
Triglycerides: 107 mg/dL (ref 0–149)
VLDL Cholesterol Cal: 20 mg/dL (ref 5–40)

## 2023-01-26 LAB — HEMOGLOBIN A1C
Est. average glucose Bld gHb Est-mCnc: 223 mg/dL
Hgb A1c MFr Bld: 9.4 % — ABNORMAL HIGH (ref 4.8–5.6)

## 2023-01-27 ENCOUNTER — Encounter: Payer: Self-pay | Admitting: Gastroenterology

## 2023-01-27 ENCOUNTER — Ambulatory Visit (INDEPENDENT_AMBULATORY_CARE_PROVIDER_SITE_OTHER): Payer: Medicaid Other | Admitting: Gastroenterology

## 2023-01-27 ENCOUNTER — Inpatient Hospital Stay: Attending: Specialist

## 2023-01-27 VITALS — BP 100/62 | HR 58 | Ht 67.0 in | Wt 171.0 lb

## 2023-01-27 DIAGNOSIS — K219 Gastro-esophageal reflux disease without esophagitis: Secondary | ICD-10-CM | POA: Diagnosis not present

## 2023-01-27 DIAGNOSIS — Z1211 Encounter for screening for malignant neoplasm of colon: Secondary | ICD-10-CM | POA: Diagnosis not present

## 2023-01-27 DIAGNOSIS — R140 Abdominal distension (gaseous): Secondary | ICD-10-CM

## 2023-01-27 DIAGNOSIS — K5900 Constipation, unspecified: Secondary | ICD-10-CM

## 2023-01-27 DIAGNOSIS — Z1212 Encounter for screening for malignant neoplasm of rectum: Secondary | ICD-10-CM

## 2023-01-27 MED ORDER — SUTAB 1479-225-188 MG PO TABS: 24.0000 | ORAL_TABLET | Freq: Once | ORAL | 0 refills | Status: AC

## 2023-01-27 NOTE — Patient Instructions (Signed)
_______________________________________________________  If your blood pressure at your visit was 140/90 or greater, please contact your primary care physician to follow up on this.  _______________________________________________________  If you are age 52 or older, your body mass index should be between 23-30. Your Body mass index is 26.78 kg/m. If this is out of the aforementioned range listed, please consider follow up with your Primary Care Provider.  If you are age 42 or younger, your body mass index should be between 19-25. Your Body mass index is 26.78 kg/m. If this is out of the aformentioned range listed, please consider follow up with your Primary Care Provider.   You have been scheduled for an endoscopy and colonoscopy. Please follow the written instructions given to you at your visit today. Please pick up your prep supplies at the pharmacy within the next 1-3 days. If you use inhalers (even only as needed), please bring them with you on the day of your procedure.   Start Psyllium daily.  Senna two tablets as needed when no bowel movement in three days.  ________________________________________________________  The [facility] providers would like to encourage you to use [facility] to communicate with providers for non-urgent requests or questions.  Due to long hold times on the telephone, sending your provider a message by [facility] may be a faster and more efficient way to get a response.  Please allow 48 business hours for a response.  Please remember that this is for non-urgent requests.  _______________________________________________________

## 2023-01-27 NOTE — Progress Notes (Signed)
HPI : Ryan Hensley is a pleasant 52 year old male with a history of poorly controlled diabetes and chronic tobacco use who is referred to [place] by Dr. Julieanne Manson for colon cancer screening.  In addition to colon cancer screening, the patient has multiple chronic GI symptoms.  Of note, the patient is a very difficult historian.  It was difficult to keep him on the subject and to get a consistent and coherent history on his symptoms and prior medical history.   He reports longstanding constipation, typically ongoing a few times per month.  He states that this has improved since starting medicine given to him.  He does not have any prescription medications for constipation, and he cannot say if the medication is a pill or powder.  In addition to having problems constipation, he states he also has problems with diarrhea when he eats certain foods.  He cannot say which foods give him more problems.  He cannot quantify how frequently he is affected by diarrhea as opposed constipation. He is bothered by excessive bloating and abdominal distention. He has chronic heartburn, acid regurgitation and excessive belching.  He takes famotidine daily which helps with his heartburn. He tells me he cannot drink water without getting nauseated.  He drinks lots of tea, which he tolerates just fine, but if he drinks even small amounts of water he will get very nauseated.  The patient states that he had a colonoscopy "a long time ago" in [place].  When asked why he had this procedure, he states it was because of all these symptoms.  He states that he was told he had food in his stomach from the previous day.  When asked if he had an upper endoscopy instead of a colonoscopy, he did not know.  His weight has been stable.  He denies any blood in stool.  No family history of GI malignancy.  He has very poorly controlled diabetes.  His hemoglobin A1c ranged from 12-15 over the past 4 years.  More recently, he has made some  improvements and his most recent hemoglobin A1c was 9.4 earlier this month.  He was recently evaluated by orthopedics for hip surgery, but told he needed to improve his diabetic control before they would proceed with surgery. He has no known cardiopulmonary comorbidities, and denies any concerning symptoms such as chest pain/pressure, shortness of breath, lightheadedness/dizziness, palpitations, orthopnea or lower extremity edema.  Past Medical History:  Diagnosis Date   Diabetes mellitus without complication (HCC)    GERD (gastroesophageal reflux disease)    Hyperlipidemia    Left eye injury, sequela 1978     Past Surgical History:  Procedure Laterality Date   boxer fracture ORIF Right 2016   EYE SURGERY  2007   Strabismus surgery, left eye.  Age 40 yo.   Family History  Problem Relation Age of Onset   Hypertension Mother    Social History   Tobacco Use   Smoking status: Every Day    Packs/day: 0.25    Years: 31.00    Additional pack years: 0.00    Total pack years: 7.75    Types: Cigarettes   Smokeless tobacco: Never  Vaping Use   Vaping Use: Never used  Substance Use Topics   Alcohol use: Yes    Comment: 3 mixed drinks per month   Drug use: No   Current Outpatient Medications  Medication Sig Dispense Refill   acetaminophen (TYLENOL) 500 MG tablet Take 500 mg by mouth  every 6 (six) hours as needed. Takes 2 tablet once a day     AgaMatrix Ultra-Thin Lancets MISC Check blood glucose twice daily before meals 100 each 11   aspirin EC 81 MG tablet 1 tab by mouth daily with morning meal     atorvastatin (LIPITOR) 80 MG tablet TAKE 1 TABLET BY MOUTH WITH EVENING MEAL 90 tablet 2   Blood Glucose Monitoring Suppl (AGAMATRIX PRESTO) w/Device KIT Check blood sugars twice daily before meals 1 kit 0   Continuous Blood Gluc Sensor (DEXCOM G6 SENSOR) MISC Blood glucose check 3 times daily before meals and as needed. 3 each 11   Continuous Blood Gluc Transmit (DEXCOM G6 TRANSMITTER)  MISC 1 Units by Does not apply route 3 (three) times daily before meals. 1 each 3   diclofenac (VOLTAREN) 75 MG EC tablet Take 1 tablet (75 mg total) by mouth 2 (two) times daily. 30 tablet 2   empagliflozin (JARDIANCE) 10 MG TABS tablet Take 1 tablet (10 mg total) by mouth daily before breakfast. 30 tablet 11   famotidine (PEPCID) 20 MG tablet 2 tabs by mouth at bedtime 60 tablet 11   finasteride (PROSCAR) 5 MG tablet Take 1 tablet (5 mg total) by mouth daily. 90 tablet 3   gabapentin (NEURONTIN) 100 MG capsule Continue 3 caps by mouth at bedtime and add another 1 cap in morning and increase by 1 cap every 3 days until taking 3 caps 3 times daily 270 capsule 11   glipiZIDE (GLUCOTROL) 5 MG tablet TAKE 1 TABLET BY MOUTH TWICE DAILY WITH MEALS 180 tablet 2   glucose blood (AGAMATRIX PRESTO TEST) test strip Check blood blood sugar twice daily before meals 100 each 11   ibuprofen (ADVIL) 400 MG tablet Take 1 tablet (400 mg total) by mouth every 8 (eight) hours as needed for moderate pain. 30 tablet 0   insulin glargine (LANTUS SOLOSTAR) 100 UNIT/ML Solostar Pen 15 units injected subcutaneously daily in the morning before breakfast 15 mL 11   metFORMIN (GLUCOPHAGE) 1000 MG tablet TAKE 1 TABLET BY MOUTH TWICE DAILY WITH A MEAL 180 tablet 2   metoCLOPramide (REGLAN) 5 MG tablet 1 tab by mouth before meal 3 times daily. 90 tablet 11   tamsulosin (FLOMAX) 0.4 MG CAPS capsule 1 tab by mouth with evening meal. 30 capsule 11   vitamin B-12 (CYANOCOBALAMIN) 50 MCG tablet Take 50 mcg by mouth. 1 a day     No current facility-administered medications for this visit.   Allergies  Allergen Reactions   Shellfish Allergy Swelling     Review of Systems: All systems reviewed and negative except where noted in HPI.    No results found.  Physical Exam: BP 100/62   Pulse (!) 58   Ht 5\' 7"  (1.702 m)   Wt 171 lb (77.6 kg)   BMI 26.78 kg/m  Constitutional: Pleasant,well-developed, African-American male in  no acute distress. HEENT: Normocephalic and atraumatic. Conjunctivae are normal. No scleral icterus. Neck supple.  Cardiovascular: Normal rate, regular rhythm.  Pulmonary/chest: Effort normal and breath sounds normal. No wheezing, rales or rhonchi. Abdominal: Soft, nondistended, nontender. Bowel sounds active throughout. There are no masses palpable. No hepatomegaly. Extremities: no edema Lymphadenopathy: No cervical adenopathy noted. Neurological: Alert and oriented to person place and time. Skin: Skin is warm and dry. No rashes noted. Psychiatric: Normal mood and affect. Behavior is normal.  CBC    Component Value Date/Time   WBC 12.9 (H) 10/13/2022 0924   WBC 12.3 (  H) 01/11/2022 1251   RBC 5.55 10/13/2022 0924   RBC 5.55 01/11/2022 1251   HGB 15.4 10/13/2022 0924   HCT 47.7 10/13/2022 0924   PLT 203 10/13/2022 0924   MCV 86 10/13/2022 0924   MCH 27.7 10/13/2022 0924   MCH 28.3 01/11/2022 1251   MCHC 32.3 10/13/2022 0924   MCHC 32.4 01/11/2022 1251   RDW 13.3 10/13/2022 0924   LYMPHSABS 4.6 (H) 10/13/2022 0924   MONOABS 0.8 01/11/2022 1251   EOSABS 0.4 10/13/2022 0924   BASOSABS 0.1 10/13/2022 0924    CMP     Component Value Date/Time   NA 138 10/13/2022 0924   K 4.6 10/13/2022 0924   CL 103 10/13/2022 0924   CO2 22 10/13/2022 0924   GLUCOSE 306 (H) 10/13/2022 0924   GLUCOSE 316 (H) 01/11/2022 1251   BUN 9 10/13/2022 0924   CREATININE 0.92 10/13/2022 0924   CALCIUM 9.8 10/13/2022 0924   PROT 7.0 10/13/2022 0924   ALBUMIN 4.2 10/13/2022 0924   AST 13 10/13/2022 0924   ALT 20 10/13/2022 0924   ALKPHOS 127 (H) 10/13/2022 0924   BILITOT 0.5 10/13/2022 0924   GFRNONAA >60 01/11/2022 1251   GFRAA 98 03/13/2019 0854       Latest Ref Rng & Units 10/13/2022    9:24 AM 02/16/2022    8:57 AM 01/11/2022   12:51 PM  CBC EXTENDED  WBC 3.4 - 10.8 x10E3/uL 12.9  WILL FOLLOW  P 12.3   RBC 4.14 - 5.80 x10E6/uL 5.55  WILL FOLLOW  P 5.55   Hemoglobin 13.0 - 17.7 g/dL 16.1   WILL FOLLOW  P 09.6   HCT 37.5 - 51.0 % 47.7  WILL FOLLOW  P 48.4   Platelets 150 - 450 x10E3/uL 203  WILL FOLLOW  P 239   NEUT# 1.4 - 7.0 x10E3/uL 7.1  WILL FOLLOW  P 8.3   Lymph# 0.7 - 3.1 x10E3/uL 4.6  WILL FOLLOW  P 3.1     P Preliminary result      ASSESSMENT AND PLAN: 52 year old male with chronically poorly controlled diabetes referred for screening colonoscopy, and endorsing numerous vague chronic GI complaints to include constipation, diarrhea, bloating, GERD symptoms, excessive belching and nausea.  I suspect many of his symptoms are likely attributable to his chronic hyperglycemia.  We discussed how hyperglycemia can affect motility of the GI tract and cause multiple GI symptoms.  We also discussed how chronic poorly controlled diabetes can affect the nerves of the GI tract and affect gastrointestinal motility.  He is at risk for gastroparesis as well as SIBO.  As the patient is due for a screening colonoscopy, we will also proceed with an upper endoscopy to evaluate for H. Pylorii, peptic ulcer disease and complications of GERD. As his symptoms seem to be improving with improved glycemic control, we will hold off on additional testing such as gastric emptying study or breath study for now. I recommended he start taking a daily fiber supplement such as Metamucil to improve his stool bulk and consistency. Will follow-up in 3 to 4 months, and if symptoms still very bothersome we can proceed with these evaluations.  Colon cancer screening - Colonoscopy  Irregular bowel habits - Daily fiber supplement (Metamucil)  Nausea, bloating, excessive belching/flatulence - Likely related to uncontrolled diabetes, improving - Consider gastric emptying study +/- breath test at follow-up - EGD  GERD - EGD to assess for complications of GERD  The details, risks (including bleeding, perforation, infection, missed  lesions, medication reactions and possible hospitalization or surgery if  complications occur), benefits, and alternatives to EGD/colonoscopy with possible biopsy and possible polypectomy were discussed with the patient and he consents to proceed.    E. Tomasa [name], MD [facility]   I spent a total of 45 minutes reviewing the patient's medical record, interviewing and examining the patient, discussing her diagnosis and management of her condition going forward, and documenting in the medical record   CC:  Julieanne Manson, MD

## 2023-01-28 ENCOUNTER — Ambulatory Visit (INDEPENDENT_AMBULATORY_CARE_PROVIDER_SITE_OTHER): Payer: Medicaid Other | Admitting: Internal Medicine

## 2023-01-28 ENCOUNTER — Encounter: Payer: Self-pay | Admitting: Internal Medicine

## 2023-01-28 VITALS — BP 110/78 | HR 76 | Resp 14 | Ht 67.0 in | Wt 169.0 lb

## 2023-01-28 DIAGNOSIS — F321 Major depressive disorder, single episode, moderate: Secondary | ICD-10-CM

## 2023-01-28 DIAGNOSIS — Z720 Tobacco use: Secondary | ICD-10-CM | POA: Insufficient documentation

## 2023-01-28 DIAGNOSIS — R0609 Other forms of dyspnea: Secondary | ICD-10-CM

## 2023-01-28 DIAGNOSIS — E118 Type 2 diabetes mellitus with unspecified complications: Secondary | ICD-10-CM

## 2023-01-28 DIAGNOSIS — N401 Enlarged prostate with lower urinary tract symptoms: Secondary | ICD-10-CM

## 2023-01-28 DIAGNOSIS — L731 Pseudofolliculitis barbae: Secondary | ICD-10-CM

## 2023-01-28 DIAGNOSIS — R972 Elevated prostate specific antigen [PSA]: Secondary | ICD-10-CM | POA: Diagnosis not present

## 2023-01-28 DIAGNOSIS — R3916 Straining to void: Secondary | ICD-10-CM

## 2023-01-28 DIAGNOSIS — K219 Gastro-esophageal reflux disease without esophagitis: Secondary | ICD-10-CM

## 2023-01-28 DIAGNOSIS — E11319 Type 2 diabetes mellitus with unspecified diabetic retinopathy without macular edema: Secondary | ICD-10-CM

## 2023-01-28 DIAGNOSIS — E782 Mixed hyperlipidemia: Secondary | ICD-10-CM | POA: Diagnosis not present

## 2023-01-28 DIAGNOSIS — Z716 Tobacco abuse counseling: Secondary | ICD-10-CM

## 2023-01-28 MED ORDER — BUPROPION HCL ER (SR) 150 MG PO TB12: ORAL_TABLET | ORAL | 11 refills | Status: DC

## 2023-01-28 NOTE — Progress Notes (Signed)
Subjective:    Patient ID: Ryan Hensley, male   DOB: 1970/10/18, 52 y.o.   MRN: [id number]   HPI    Medication compliance Issues:  Almost all of meds are 2/3 full and should have been out last month.    2.  Exertional Dyspnea and also sudden onset fatigue:  gets home and just very fatigued all of a sudden.  No chest pain. Notes the dyspnea walking up a hill or his stairs and has to stop and take about 10 breaths.  Has had this for 2-3 years and does not feel it has progressed much since starting. He does smoke.  Smoking 1/3 ppd now.  Long term smoker He is getting SSI for back pain and vision loss on left.    3.  Elevated PSA:  does not sound like he has been to Urology yet.  Referral sent in March.  Checking into whether he is not responding to calls.  4. GI: planning for colonoscopy and EGD possibly on 03/05/2023 with [place].    5.  DM:  A1C still high, but MUCH improved at 9.6%.  Mild microalbuminuria, but only took Jardiance for one month.  Also, shares his Lantus injector is not working.  Eye appt with Dr. Dione Booze in September.    6.  BPH:  again, not taking meds regularly.  His symptoms are better nonetheless, just not where he would like to be  7.  Depression.      01/28/2023    9:26 AM  Depression screen PHQ 2/9  Decreased Interest 3  Down, Depressed, Hopeless 2  PHQ - 2 Score 5  Tired, decreased energy 3  Change in appetite 2  Feeling bad or failure about yourself  2  Trouble concentrating 0  Moving slowly or fidgety/restless 0  Suicidal thoughts 1  Difficult doing work/chores Very difficult  Lot of financial stressors, though to get disability soon.  Nephew always in trouble.  13 yo daughter living in [place].  Mother of daughter does not allow much interaction.  8.  Hyperlipidemia:  LDL not at goal.   Lipid Panel     Component Value Date/Time   CHOL 146 01/25/2023 0839   TRIG 107 01/25/2023 0839   HDL 43 01/25/2023 0839   LDLCALC 83 01/25/2023 0839   LABVLDL 20  01/25/2023 0839   9.  Lump in left axilla:  noted 2 months ago.  Watery stuff comes out when squeezes.  Itches and stings.           Current Meds  Medication Sig   acetaminophen (TYLENOL) 500 MG tablet Take 500 mg by mouth every 6 (six) hours as needed. Takes 2 tablet once a day   AgaMatrix Ultra-Thin Lancets MISC Check blood glucose twice daily before meals (Patient taking differently: Check blood glucose twice daily before meals. occasionally)   atorvastatin (LIPITOR) 80 MG tablet TAKE 1 TABLET BY MOUTH WITH EVENING MEAL   Blood Glucose Monitoring Suppl (AGAMATRIX PRESTO) w/Device KIT Check blood sugars twice daily before meals   diclofenac (VOLTAREN) 75 MG EC tablet Take 1 tablet (75 mg total) by mouth 2 (two) times daily.   empagliflozin (JARDIANCE) 10 MG TABS tablet Take 1 tablet (10 mg total) by mouth daily before breakfast.   famotidine (PEPCID) 20 MG tablet 2 tabs by mouth at bedtime   finasteride (PROSCAR) 5 MG tablet Take 1 tablet (5 mg total) by mouth daily.   gabapentin (NEURONTIN) 100 MG capsule Continue 3 caps by  mouth at bedtime and add another 1 cap in morning and increase by 1 cap every 3 days until taking 3 caps 3 times daily   glipiZIDE (GLUCOTROL) 5 MG tablet TAKE 1 TABLET BY MOUTH TWICE DAILY WITH MEALS   glucose blood (AGAMATRIX PRESTO TEST) test strip Check blood blood sugar twice daily before meals   ibuprofen (ADVIL) 400 MG tablet Take 1 tablet (400 mg total) by mouth every 8 (eight) hours as needed for moderate pain.   insulin glargine (LANTUS SOLOSTAR) 100 UNIT/ML Solostar Pen 15 units injected subcutaneously daily in the morning before breakfast   metFORMIN (GLUCOPHAGE) 1000 MG tablet TAKE 1 TABLET BY MOUTH TWICE DAILY WITH A MEAL   metoCLOPramide (REGLAN) 5 MG tablet 1 tab by mouth before meal 3 times daily.   tamsulosin (FLOMAX) 0.4 MG CAPS capsule 1 tab by mouth with evening meal.   vitamin B-12 (CYANOCOBALAMIN) 50 MCG tablet Take 50 mcg by mouth. 1 a day    Allergies  Allergen Reactions   Shellfish Allergy Swelling     Review of Systems    Objective:   BP 110/78 (BP Location: Left Arm, Patient Position: Sitting, Cuff Size: Normal)   Pulse 76   Resp 14   Ht 5\' 7"  (1.702 m)   Wt 169 lb (76.7 kg)   BMI 26.47 kg/m   Physical Exam HENT:     Head: Normocephalic and atraumatic.     Right Ear: Tympanic membrane and external ear normal.     Left Ear: Tympanic membrane and external ear normal.     Ears:     Comments: Dry cerumen in bilateral canals, but does not completely obstruct    Nose: Nose normal.     Mouth/Throat:     Mouth: Mucous membranes are moist.     Pharynx: Oropharynx is clear.  Eyes:     Extraocular Movements: Extraocular movements intact.     Comments: Left cornea opaque and pupil dilated  Cardiovascular:     Rate and Rhythm: Normal rate and regular rhythm.     Heart sounds: S1 normal and S2 normal. No murmur heard.    No friction rub. No S3 or S4 sounds.     Comments: No carotid bruits.  Carotid, radial, femoral, DP and PT pulses normal and equal.   Pulmonary:     Effort: Pulmonary effort is normal.     Breath sounds: Normal breath sounds.  Abdominal:     General: Bowel sounds are normal.     Palpations: Abdomen is soft. There is no hepatomegaly, splenomegaly or mass.     Tenderness: There is no abdominal tenderness.     Hernia: No hernia is present.  Musculoskeletal:     Cervical back: Normal range of motion and neck supple.     Right lower leg: No edema.     Left lower leg: No edema.  Lymphadenopathy:     Head:     Right side of head: No submental or submandibular adenopathy.     Left side of head: No submental or submandibular adenopathy.     Cervical: No cervical adenopathy.     Upper Body:     Right upper body: No axillary adenopathy.     Left upper body: No axillary adenopathy.  Skin:    Comments: At lateral aspect of left axilla, cystic lesion formed around at least 2 hair follicles.  1 cm  in total diameter.  No erythema.  NT  Neurological:  Mental Status: He is alert and oriented to person, place, and time.     Cranial Nerves: Cranial nerves 2-12 are intact.    ECG:  NSR without ischemic or injury changes  Assessment & Plan   Medication non compliance:  urged patient to set up meds so he remembers to take regularly--pillbox, etc.    2.  DM:  Needs to go pick up Jardiance 10 mg and get restarted.  To take Lantus pen to pharmacy and see if something can be done or if can be replaced.  In future, to address immediately when does not work properly.  Dr Luciana Axe for eye--cancel with Dr Dione Booze.   He is also to investigate [facility] [employer] and [facility] for access to pool and other exercise options --recumbent bike and get started.    3. Exertional dyspnea:  multiple risk factors for CAD, though this could be mainly respiratory with tobacco use history.  Referral to Cardiology for evaluation.  4.  Elevated PSA:  apparently, he has not been answering phone calls from Urology.  He is now scheduled to be seen next month.  5. BPH:  somewhat improved, bit missing meds regularly.  Elevated PSA may be related  6.  Depression and Tobacco Cessation:  Bupropion XR 150 mg once daily for 3 days then increase to twice daily.  Quit smoking somewhere between days 10 and 14 of use.  Follow up in 1-2 weeks.  7.  Left axillary cyst/ingrown hair:  Leave alone and will remove next available visit.  8.  Hyperlipidemia:  not quite at goal.  Take meds.  Lifestyle changes.    9.  GERD/gastroparesis:  Famotidine and Metoclopramide.  Encouraged him to continue to work on control of DM to prevent further damage to neurologic system.  10.  DM peripheral neuropathy:  Gabapentin.  11.  Diabetic retinopathy:  referral to Dr. Luciana Axe.

## 2023-01-28 NOTE — Patient Instructions (Addendum)
[facility] [address], [place] [address]  [phone] Hours of Operation Mondays to Thursdays: 8 am to 8 pm Fridays: 9 am to 8 pm Saturdays: 9 am to 1 pm Sundays: Closed  [facility] Address: [address], [place], [place] [address] Hours:  Open ? Closes 8?PM Phone: [phone]  [facility]--look into getting a bike.  Or the [facility]--[facility] a woman  Google it.  Your goal is to get pricing and decide where to go.  Get scholarship for the Y--might end up being free.    Beginning of next week--make a decision of where to go and do it.   Tobacco Cessation:   Start Bupropion once daily in morning for 3 days, then increase to twice daily on day 4. Pick a day between Days 10 and 14 when you will get rid of all tobacco supplies and stop smoking and do it.  1800QUITNOW or [phone], the former for support and possibly free nicotine patches/gum and support; the latter for [facility] Smoking cessation class. Get rid of all smoking supplies:  Cigarettes, lighters, ashtrays--no stashes just in case at home if you are serious.

## 2023-01-29 ENCOUNTER — Other Ambulatory Visit: Payer: Self-pay | Admitting: Internal Medicine

## 2023-01-29 ENCOUNTER — Other Ambulatory Visit: Payer: Self-pay

## 2023-01-29 ENCOUNTER — Telehealth: Payer: Self-pay | Admitting: Gastroenterology

## 2023-01-29 MED ORDER — METOCLOPRAMIDE HCL 5 MG PO TABS: ORAL_TABLET | ORAL | 11 refills | Status: DC

## 2023-01-29 MED ORDER — TAMSULOSIN HCL 0.4 MG PO CAPS: ORAL_CAPSULE | ORAL | 11 refills | Status: DC

## 2023-01-29 MED ORDER — DEXCOM G6 SENSOR MISC: 11 refills | Status: DC

## 2023-01-29 MED ORDER — NA SULFATE-K SULFATE-MG SULF 17.5-3.13-1.6 GM/177ML PO SOLN: 1.0000 | Freq: Once | ORAL | 0 refills | Status: AC

## 2023-01-29 MED ORDER — DEXCOM G6 TRANSMITTER MISC: 1.0000 [IU] | Freq: Three times a day (TID) | 3 refills | Status: DC

## 2023-01-29 MED ORDER — AGAMATRIX ULTRA-THIN LANCETS MISC: 11 refills | Status: DC

## 2023-01-29 NOTE — Telephone Encounter (Signed)
Contacted patient and confirmed pharmacy. Patient's sutab was sent in but was not covered by insurance. Suprep was sent in to the pharmacy and patient was  notified.

## 2023-01-29 NOTE — Telephone Encounter (Signed)
Inbound call from patient stating that he was seen on 6/12 with Dr. Tomasa [name]. Patient stated the pharmacy stated they did not have the prescription for Sutab and his insurance does not cover the prep. Patient is requesting a new prep to be sent and to be called to be advised on what's going on. Please advise.

## 2023-02-05 ENCOUNTER — Encounter: Payer: Self-pay | Admitting: Internal Medicine

## 2023-02-05 ENCOUNTER — Ambulatory Visit: Payer: Medicaid Other | Admitting: Internal Medicine

## 2023-02-05 VITALS — BP 104/64 | HR 94 | Resp 16 | Ht 67.0 in | Wt 174.0 lb

## 2023-02-05 DIAGNOSIS — F321 Major depressive disorder, single episode, moderate: Secondary | ICD-10-CM | POA: Diagnosis not present

## 2023-02-05 NOTE — Progress Notes (Signed)
    Subjective:    Patient ID: Ryan Hensley, male   DOB: Feb 20, 1971, 52 y.o.   MRN: [id number]   HPI  Tolerating Bupropion twice daily.  No issue with anxiety and no suicidal ideation.  No manic symptoms.  Current Meds  Medication Sig   acetaminophen (TYLENOL) 500 MG tablet Take 500 mg by mouth every 6 (six) hours as needed. Takes 2 tablet once a day   AgaMatrix Ultra-Thin Lancets MISC Check blood glucose twice daily before meals   atorvastatin (LIPITOR) 80 MG tablet TAKE 1 TABLET BY MOUTH WITH EVENING MEAL   Blood Glucose Monitoring Suppl (AGAMATRIX PRESTO) w/Device KIT Check blood sugars twice daily before meals   buPROPion (WELLBUTRIN SR) 150 MG 12 hr tablet 1 tab by mouth in morning daily for 3 days then increase to 1 tab by mouth twice daily on day 4   empagliflozin (JARDIANCE) 10 MG TABS tablet Take 1 tablet (10 mg total) by mouth daily before breakfast.   famotidine (PEPCID) 20 MG tablet 2 tabs by mouth at bedtime   finasteride (PROSCAR) 5 MG tablet Take 1 tablet (5 mg total) by mouth daily.   gabapentin (NEURONTIN) 100 MG capsule Continue 3 caps by mouth at bedtime and add another 1 cap in morning and increase by 1 cap every 3 days until taking 3 caps 3 times daily   glipiZIDE (GLUCOTROL) 5 MG tablet TAKE 1 TABLET BY MOUTH TWICE DAILY WITH MEALS   glucose blood (AGAMATRIX PRESTO TEST) test strip Check blood blood sugar twice daily before meals   ibuprofen (ADVIL) 400 MG tablet Take 1 tablet (400 mg total) by mouth every 8 (eight) hours as needed for moderate pain.   insulin glargine (LANTUS SOLOSTAR) 100 UNIT/ML Solostar Pen INJECT 15 UNITS SUBCUTANEOUS IN THE MORNING BEFORE BREAKFAST   metFORMIN (GLUCOPHAGE) 1000 MG tablet TAKE 1 TABLET BY MOUTH TWICE DAILY WITH A MEAL   metoCLOPramide (REGLAN) 5 MG tablet 1 tab by mouth before meal 3 times daily.   polyethylene glycol (MIRALAX / GLYCOLAX) 17 g packet Take 17 g by mouth daily.   tamsulosin (FLOMAX) 0.4 MG CAPS capsule 1 tab by  mouth with evening meal.   vitamin B-12 (CYANOCOBALAMIN) 50 MCG tablet Take 50 mcg by mouth. 1 a day   Allergies  Allergen Reactions   Shellfish Allergy Swelling     Review of Systems    Objective:   BP 104/64 (BP Location: Right Arm, Patient Position: Sitting, Cuff Size: Normal)   Pulse 94   Resp 16   Ht 5\' 7"  (1.702 m)   Wt 174 lb (78.9 kg)   BMI 27.25 kg/m   Physical Exam  NAD Good eye contact.   Assessment & Plan  Depression:  continue bupropion. Will follow up in about 1 month.

## 2023-02-11 ENCOUNTER — Telehealth: Payer: Self-pay

## 2023-02-11 NOTE — Telephone Encounter (Signed)
Fawn Kirk MD office called to notify that they are out of network for patients insurance so they are unable to see him.

## 2023-02-15 DIAGNOSIS — C61 Malignant neoplasm of prostate: Secondary | ICD-10-CM | POA: Insufficient documentation

## 2023-02-15 HISTORY — DX: Malignant neoplasm of prostate: C61

## 2023-02-19 NOTE — Telephone Encounter (Signed)
Will check in network specialists.

## 2023-02-23 ENCOUNTER — Other Ambulatory Visit: Payer: Self-pay | Admitting: Internal Medicine

## 2023-02-23 DIAGNOSIS — E11319 Type 2 diabetes mellitus with unspecified diabetic retinopathy without macular edema: Secondary | ICD-10-CM

## 2023-02-23 NOTE — Telephone Encounter (Signed)
[facility] retina specialists ([address], [place], [place], [address]) are in network with patients insurance and have appointments available in August.

## 2023-03-04 ENCOUNTER — Telehealth: Payer: Self-pay | Admitting: Gastroenterology

## 2023-03-04 NOTE — Telephone Encounter (Signed)
Please address, thank you

## 2023-03-04 NOTE — Telephone Encounter (Signed)
Spoke with patient and went through prep instructions and sent instructions in My chart.  Patient verbalized understanding of diabetic holds, NSAID hold and prep instructions.

## 2023-03-04 NOTE — Telephone Encounter (Signed)
Patient called stated he is scheduled for tomorrow to have his colonoscopy. He said the pharmacy gave him a different prep and he has questions. Also stated he needs to take his insulin and is wondering if he can eat something. Please advise.

## 2023-03-05 ENCOUNTER — Ambulatory Visit: Payer: Medicaid Other | Admitting: Urology

## 2023-03-05 ENCOUNTER — Ambulatory Visit (AMBULATORY_SURGERY_CENTER): Payer: Medicaid Other | Admitting: Gastroenterology

## 2023-03-05 ENCOUNTER — Other Ambulatory Visit: Payer: Self-pay | Admitting: Urology

## 2023-03-05 ENCOUNTER — Encounter: Payer: Self-pay | Admitting: Gastroenterology

## 2023-03-05 VITALS — BP 151/79 | HR 86 | Temp 98.7°F | Resp 10 | Ht 67.0 in | Wt 171.0 lb

## 2023-03-05 DIAGNOSIS — K219 Gastro-esophageal reflux disease without esophagitis: Secondary | ICD-10-CM | POA: Diagnosis not present

## 2023-03-05 DIAGNOSIS — Z1211 Encounter for screening for malignant neoplasm of colon: Secondary | ICD-10-CM | POA: Diagnosis not present

## 2023-03-05 DIAGNOSIS — R140 Abdominal distension (gaseous): Secondary | ICD-10-CM

## 2023-03-05 DIAGNOSIS — L905 Scar conditions and fibrosis of skin: Secondary | ICD-10-CM

## 2023-03-05 DIAGNOSIS — D128 Benign neoplasm of rectum: Secondary | ICD-10-CM

## 2023-03-05 DIAGNOSIS — N401 Enlarged prostate with lower urinary tract symptoms: Secondary | ICD-10-CM

## 2023-03-05 DIAGNOSIS — K2950 Unspecified chronic gastritis without bleeding: Secondary | ICD-10-CM

## 2023-03-05 MED ORDER — SODIUM CHLORIDE 0.9 % IV SOLN: 500.0000 mL | Freq: Once | INTRAVENOUS | Status: DC

## 2023-03-05 NOTE — Progress Notes (Signed)
[facility] History and Physical   Primary Care Physician:  Julieanne Manson, MD   Reason for Procedure:   Nausea, bloating, GERD, colon cancer screening  Plan:    EGD, colonoscopy     HPI: Ryan Hensley is a 52 y.o. male undergoing EGD and colonoscopy for colon cancer screening and to evaluate chronic symptoms of nausea, bloating, heartburn, acid regurgitation.  He has no family history of colon cancer.  He thinks he had a colonoscopy a long time ago, but can give no details as to when.   He has irregular bowel movements, alternating between constipation and diarrhea.   Past Medical History:  Diagnosis Date   Diabetes mellitus without complication (HCC)    GERD (gastroesophageal reflux disease)    Hyperlipidemia    Left eye injury, sequela 1978    Past Surgical History:  Procedure Laterality Date   boxer fracture ORIF Right 2016   EYE SURGERY  2007   Strabismus surgery, left eye.  Age 40 yo.    Prior to Admission medications   Medication Sig Start Date End Date Taking? Authorizing Provider  metoCLOPramide (REGLAN) 10 MG tablet Take 5 mg by mouth 3 (three) times daily. 12/16/22  Yes [provider]  Na Sulfate-K Sulfate-Mg Sulf 17.5-3.13-1.6 GM/177ML SOLN Take by mouth once. 01/29/23  Yes [provider]  acetaminophen (TYLENOL) 500 MG tablet Take 500 mg by mouth every 6 (six) hours as needed. Takes 2 tablet once a day    [provider]  AgaMatrix Ultra-Thin Lancets MISC Check blood glucose twice daily before meals 01/29/23   Julieanne Manson, MD  aspirin EC 81 MG tablet 1 tab by mouth daily with morning meal Patient not taking: Reported on 01/28/2023 12/13/18   Julieanne Manson, MD  atorvastatin (LIPITOR) 80 MG tablet TAKE 1 TABLET BY MOUTH WITH EVENING MEAL 01/22/23   Julieanne Manson, MD  Blood Glucose Monitoring Suppl (AGAMATRIX PRESTO) w/Device KIT Check blood sugars twice daily before meals 01/14/22   Julieanne Manson, MD   buPROPion [facility] SR) 150 MG 12 hr tablet 1 tab by mouth in morning daily for 3 days then increase to 1 tab by mouth twice daily on day 4 01/28/23   Julieanne Manson, MD  Continuous Glucose Sensor (DEXCOM G6 SENSOR) MISC Blood glucose check 3 times daily before meals and as needed. Patient not taking: Reported on 02/05/2023 01/29/23   Julieanne Manson, MD  Continuous Glucose Transmitter (DEXCOM G6 TRANSMITTER) MISC 1 Units by Does not apply route 3 (three) times daily before meals. Patient not taking: Reported on 02/05/2023 01/29/23   Julieanne Manson, MD  diclofenac (VOLTAREN) 75 MG EC tablet Take 1 tablet (75 mg total) by mouth 2 (two) times daily. Patient not taking: Reported on 02/05/2023 10/28/22   Tarry Kos, MD  empagliflozin (JARDIANCE) 10 MG TABS tablet Take 1 tablet (10 mg total) by mouth daily before breakfast. 10/22/22   Julieanne Manson, MD  famotidine (PEPCID) 20 MG tablet 2 tabs by mouth at bedtime 01/14/22   Julieanne Manson, MD  finasteride (PROSCAR) 5 MG tablet Take 1 tablet (5 mg total) by mouth daily. 10/22/22   Julieanne Manson, MD  gabapentin (NEURONTIN) 100 MG capsule Continue 3 caps by mouth at bedtime and add another 1 cap in morning and increase by 1 cap every 3 days until taking 3 caps 3 times daily 10/22/22   Julieanne Manson, MD  glipiZIDE (GLUCOTROL) 5 MG tablet TAKE 1 TABLET BY MOUTH TWICE DAILY WITH MEALS 01/22/23  Julieanne Manson, MD  glucose blood (AGAMATRIX PRESTO TEST) test strip Check blood blood sugar twice daily before meals 01/14/22   Julieanne Manson, MD  ibuprofen (ADVIL) 400 MG tablet Take 1 tablet (400 mg total) by mouth every 8 (eight) hours as needed for moderate pain. 10/19/22   Zadie Rhine, MD  insulin glargine (LANTUS SOLOSTAR) 100 UNIT/ML Solostar Pen INJECT 15 UNITS SUBCUTANEOUS IN THE MORNING BEFORE BREAKFAST 01/29/23   Julieanne Manson, MD  metFORMIN (GLUCOPHAGE) 1000 MG tablet TAKE 1 TABLET BY MOUTH TWICE DAILY WITH A MEAL  01/22/23   Julieanne Manson, MD  metoCLOPramide (REGLAN) 5 MG tablet 1 tab by mouth before meal 3 times daily. 01/29/23   Julieanne Manson, MD  polyethylene glycol (MIRALAX / GLYCOLAX) 17 g packet Take 17 g by mouth daily.    [provider]  tamsulosin (FLOMAX) 0.4 MG CAPS capsule 1 tab by mouth with evening meal. 01/29/23   Julieanne Manson, MD  vitamin B-12 (CYANOCOBALAMIN) 50 MCG tablet Take 50 mcg by mouth. 1 a day    [provider]    Current Outpatient Medications  Medication Sig Dispense Refill   metoCLOPramide (REGLAN) 10 MG tablet Take 5 mg by mouth 3 (three) times daily.     Na Sulfate-K Sulfate-Mg Sulf 17.5-3.13-1.6 GM/177ML SOLN Take by mouth once.     acetaminophen (TYLENOL) 500 MG tablet Take 500 mg by mouth every 6 (six) hours as needed. Takes 2 tablet once a day     AgaMatrix Ultra-Thin Lancets MISC Check blood glucose twice daily before meals 100 each 11   aspirin EC 81 MG tablet 1 tab by mouth daily with morning meal (Patient not taking: Reported on 01/28/2023)     atorvastatin (LIPITOR) 80 MG tablet TAKE 1 TABLET BY MOUTH WITH EVENING MEAL 90 tablet 2   Blood Glucose Monitoring Suppl (AGAMATRIX PRESTO) w/Device KIT Check blood sugars twice daily before meals 1 kit 0   buPROPion (WELLBUTRIN SR) 150 MG 12 hr tablet 1 tab by mouth in morning daily for 3 days then increase to 1 tab by mouth twice daily on day 4 60 tablet 11   Continuous Glucose Sensor (DEXCOM G6 SENSOR) MISC Blood glucose check 3 times daily before meals and as needed. (Patient not taking: Reported on 02/05/2023) 3 each 11   Continuous Glucose Transmitter (DEXCOM G6 TRANSMITTER) MISC 1 Units by Does not apply route 3 (three) times daily before meals. (Patient not taking: Reported on 02/05/2023) 1 each 3   diclofenac (VOLTAREN) 75 MG EC tablet Take 1 tablet (75 mg total) by mouth 2 (two) times daily. (Patient not taking: Reported on 02/05/2023) 30 tablet 2   empagliflozin (JARDIANCE) 10 MG TABS  tablet Take 1 tablet (10 mg total) by mouth daily before breakfast. 30 tablet 11   famotidine (PEPCID) 20 MG tablet 2 tabs by mouth at bedtime 60 tablet 11   finasteride (PROSCAR) 5 MG tablet Take 1 tablet (5 mg total) by mouth daily. 90 tablet 3   gabapentin (NEURONTIN) 100 MG capsule Continue 3 caps by mouth at bedtime and add another 1 cap in morning and increase by 1 cap every 3 days until taking 3 caps 3 times daily 270 capsule 11   glipiZIDE (GLUCOTROL) 5 MG tablet TAKE 1 TABLET BY MOUTH TWICE DAILY WITH MEALS 180 tablet 2   glucose blood (AGAMATRIX PRESTO TEST) test strip Check blood blood sugar twice daily before meals 100 each 11   ibuprofen (ADVIL) 400 MG tablet Take 1  tablet (400 mg total) by mouth every 8 (eight) hours as needed for moderate pain. 30 tablet 0   insulin glargine (LANTUS SOLOSTAR) 100 UNIT/ML Solostar Pen INJECT 15 UNITS SUBCUTANEOUS IN THE MORNING BEFORE BREAKFAST 15 mL 11   metFORMIN (GLUCOPHAGE) 1000 MG tablet TAKE 1 TABLET BY MOUTH TWICE DAILY WITH A MEAL 180 tablet 2   metoCLOPramide (REGLAN) 5 MG tablet 1 tab by mouth before meal 3 times daily. 90 tablet 11   polyethylene glycol (MIRALAX / GLYCOLAX) 17 g packet Take 17 g by mouth daily.     tamsulosin (FLOMAX) 0.4 MG CAPS capsule 1 tab by mouth with evening meal. 30 capsule 11   vitamin B-12 (CYANOCOBALAMIN) 50 MCG tablet Take 50 mcg by mouth. 1 a day     Current Facility-Administered Medications  Medication Dose Route Frequency Provider Last Rate Last Admin   0.9 %  sodium chloride infusion  500 mL Intravenous Once Jenel Lucks, MD        Allergies as of 03/05/2023 - Review Complete 03/05/2023  Allergen Reaction Noted   Shellfish allergy Swelling 01/24/2019    Family History  Problem Relation Age of Onset   Hypertension Mother     Social History   Socioeconomic History   Marital status: Legally Separated    Spouse name: Not on file   Number of children: 0   Years of education: 11   Highest  education level: Not on file  Occupational History   Not on file  Tobacco Use   Smoking status: Every Day    Current packs/day: 0.30    Average packs/day: 0.3 packs/day for 31.0 years (9.3 ttl pk-yrs)    Types: Cigarettes    Passive exposure: Current   Smokeless tobacco: Never  Vaping Use   Vaping status: Never Used  Substance and Sexual Activity   Alcohol use: Yes    Comment: 3 mixed drinks per month   Drug use: No   Sexual activity: Not on file  Other Topics Concern   Not on file  Social History Narrative   Lives with his mother near [place].   Social Determinants of Health   Financial Resource Strain: Not on file  Food Insecurity: No Food Insecurity (12/13/2018)   Hunger Vital Sign    Worried About Running Out of Food in the Last Year: Never true    Ran Out of Food in the Last Year: Never true  Transportation Needs: No Transportation Needs (12/13/2018)   PRAPARE - Administrator, Civil Service (Medical): No    Lack of Transportation (Non-Medical): No  Physical Activity: Not on file  Stress: Not on file  Social Connections: Unknown (12/30/2021)   [facility]   Social Network    Social Network: Not on file  Intimate Partner Violence: Unknown (11/21/2021)   Received from [facility]   HITS    Physically Hurt: Not on file    Insult or Talk Down To: Not on file    Threaten Physical Harm: Not on file    Scream or Curse: Not on file    Review of Systems:  All other review of systems negative except as mentioned in the HPI.  Physical Exam: Vital signs BP 130/78   Pulse 86   Temp 98.7 F (37.1 C) (Temporal)   Ht 5\' 7"  (1.702 m)   Wt 171 lb (77.6 kg)   SpO2 99%   BMI 26.78 kg/m   General:   Alert,  Well-developed, well-nourished, pleasant and cooperative in NAD Airway:  Mallampati 2 Lungs:  Clear throughout to auscultation.   Heart:  Regular rate and rhythm; no murmurs, clicks, rubs,  or gallops. Abdomen:  Soft, nontender and  nondistended. Normal bowel sounds.   Neuro/Psych:  Normal mood and affect. A and O x 3    E. Tomasa [name], MD [facility]

## 2023-03-05 NOTE — Progress Notes (Signed)
Sedate, gd SR, tolerated procedure well, VSS, report to RN 

## 2023-03-05 NOTE — Progress Notes (Signed)
Called to room to assist during endoscopic procedure.  Patient ID and intended procedure confirmed with present staff. Received instructions for my participation in the procedure from the performing physician.  

## 2023-03-05 NOTE — Patient Instructions (Addendum)
Await biopsy results.  Avoid NSAID's (motrin, advil, aleve, Asprin).    Continue Pepcid.  Handouts on polyps and GERD provided.  YOU HAD AN ENDOSCOPIC PROCEDURE TODAY AT THE [facility]:   Refer to the procedure report that was given to you for any specific questions about what was found during the examination.  If the procedure report does not answer your questions, please call your gastroenterologist to clarify.  If you requested that your care partner not be given the details of your procedure findings, then the procedure report has been included in a sealed envelope for you to review at your convenience later.  YOU SHOULD EXPECT: Some feelings of bloating in the abdomen. Passage of more gas than usual.  Walking can help get rid of the air that was put into your GI tract during the procedure and reduce the bloating. If you had a lower endoscopy (such as a colonoscopy or flexible sigmoidoscopy) you may notice spotting of blood in your stool or on the toilet paper. If you underwent a bowel prep for your procedure, you may not have a normal bowel movement for a few days.  Please Note:  You might notice some irritation and congestion in your nose or some drainage.  This is from the oxygen used during your procedure.  There is no need for concern and it should clear up in a day or so.  SYMPTOMS TO REPORT IMMEDIATELY:  Following lower endoscopy (colonoscopy or flexible sigmoidoscopy):  Excessive amounts of blood in the stool  Significant tenderness or worsening of abdominal pains  Swelling of the abdomen that is new, acute  Fever of 100F or higher  Following upper endoscopy (EGD)  Vomiting of blood or coffee ground material  New chest pain or pain under the shoulder blades  Painful or persistently difficult swallowing  New shortness of breath  Fever of 100F or higher  Black, tarry-looking stools  For urgent or emergent issues, a gastroenterologist can be reached at any hour  by calling [phone]. Do not use MyChart messaging for urgent concerns.    DIET:  We do recommend a small meal at first, but then you may proceed to your regular diet.  Drink plenty of fluids but you should avoid alcoholic beverages for 24 hours.  ACTIVITY:  You should plan to take it easy for the rest of today and you should NOT DRIVE or use heavy machinery until tomorrow (because of the sedation medicines used during the test).    FOLLOW UP: Our staff will call the number listed on your records the next business day following your procedure.  We will call around 7:15- 8:00 am to check on you and address any questions or concerns that you may have regarding the information given to you following your procedure. If we do not reach you, we will leave a message.     If any biopsies were taken you will be contacted by phone or by letter within the next 1-3 weeks.  Please call us at [phone] if you have not heard about the biopsies in 3 weeks.    SIGNATURES/CONFIDENTIALITY: You and/or your care partner have signed paperwork which will be entered into your electronic medical record.  These signatures attest to the fact that that the information above on your After Visit Summary has been reviewed and is understood.  Full responsibility of the confidentiality of this discharge information lies with you and/or your care-partner.

## 2023-03-05 NOTE — Progress Notes (Signed)
Pt's states no medical or surgical changes since previsit or office visit. 

## 2023-03-08 ENCOUNTER — Telehealth: Payer: Self-pay

## 2023-03-08 ENCOUNTER — Telehealth: Payer: Self-pay | Admitting: *Deleted

## 2023-03-08 NOTE — Telephone Encounter (Signed)
Patient would like to be on the waiting list for a sooner appointment than his rescheduled appt. On 10/29  Appointment for: to remove left axillary cyst-procedure visit.  and for follow up start of Bupropion   Will call when an appointment becomes available

## 2023-03-08 NOTE — Op Note (Signed)
[name] Patient Name: Ryan Hensley Procedure Date: 03/07/2023 7:43 PM MRN: [id number] Endoscopist: Lorin Picket E. Tomasa [name] , MD, [id number] Age: 52 Referring MD:  Date of Birth: 07-26-1971 Gender: Male Account #: [id number] Procedure:                Colonoscopy Indications:              Screening for colorectal malignant neoplasm, This                            is the patient's first colonoscopy Medicines:                Monitored Anesthesia Care Procedure:                Pre-Anesthesia Assessment:                           - Prior to the procedure, a History and Physical                            was performed, and patient medications and                            allergies were reviewed. The patient's tolerance of                            previous anesthesia was also reviewed. The risks                            and benefits of the procedure and the sedation                            options and risks were discussed with the patient.                            All questions were answered, and informed consent                            was obtained. Prior Anticoagulants: The patient has                            taken no anticoagulant or antiplatelet agents. ASA                            Grade Assessment: II - A patient with mild systemic                            disease. After reviewing the risks and benefits,                            the patient was deemed in satisfactory condition to                            undergo the procedure.  After obtaining informed consent, the colonoscope                            was passed under direct vision. Throughout the                            procedure, the patient's blood pressure, pulse, and                            oxygen saturations were monitored continuously. The                            Olympus CF-HQ190L ([id number]) Colonoscope was                            introduced through the  anus and advanced to the the                            terminal ileum, with identification of the                            appendiceal orifice and IC valve. The colonoscopy                            was somewhat difficult due to poor endoscopic                            visualization. The patient tolerated the procedure                            well. The quality of the bowel preparation was                            adequate. The terminal ileum, ileocecal valve,                            appendiceal orifice, and rectum were photographed.                            The bowel preparation used was SUPREP via split                            dose instruction. Findings:                 The perianal and digital rectal examinations were                            normal. Pertinent negatives include normal                            sphincter tone and no palpable rectal lesions.                           A 3 mm polyp was found in the rectum.  The polyp was                            sessile. The polyp was removed with a cold snare.                            Resection and retrieval were complete. Estimated                            blood loss was minimal.                           The exam was otherwise normal throughout the                            examined colon.                           The terminal ileum appeared normal.                           The retroflexed view of the distal rectum and anal                            verge was normal and showed no anal or rectal                            abnormalities. Complications:            No immediate complications. Estimated Blood Loss:     Estimated blood loss was minimal. Impression:               - One 3 mm polyp in the rectum, removed with a cold                            snare. Resected and retrieved.                           - The examined portion of the ileum was normal.                           - The distal rectum and anal  verge are normal on                            retroflexion view. Recommendation:           - Patient has a contact number available for                            emergencies. The signs and symptoms of potential                            delayed complications were discussed with the                            patient. Return to normal activities tomorrow.  Written discharge instructions were provided to the                            patient.                           - Resume previous diet.                           - Continue present medications.                           - Await pathology results.                           - Repeat colonoscopy (date not yet determined) for                            surveillance based on pathology results.                           ***********************DUE TO WORLDWIDE INTERNET                            OUTAGE AT THE TIME OF THIS PROCEDURE, NO                            PHOTOGRAPHS WERE ABLE TO BE                            RECORDED*****************  E. Tomasa [name], MD 03/07/2023 7:49:29 PM This report has been signed electronically.

## 2023-03-08 NOTE — Op Note (Signed)
[facility] Patient Name: Ryan Hensley Procedure Date: 03/07/2023 7:33 PM MRN: [id number] Endoscopist: Lorin Picket E. Tomasa [name] , MD, [id number] Age: 52 Referring MD:  Date of Birth: April 19, 1971 Gender: Male Account #: [id number] Procedure:                Upper GI endoscopy Indications:              Esophageal reflux symptoms that persist despite                            appropriate therapy, Abdominal bloating, Nausea                            with vomiting Medicines:                Monitored Anesthesia Care Procedure:                Pre-Anesthesia Assessment:                           - Prior to the procedure, a History and Physical                            was performed, and patient medications and                            allergies were reviewed. The patient's tolerance of                            previous anesthesia was also reviewed. The risks                            and benefits of the procedure and the sedation                            options and risks were discussed with the patient.                            All questions were answered, and informed consent                            was obtained. Prior Anticoagulants: The patient has                            taken no anticoagulant or antiplatelet agents. ASA                            Grade Assessment: II - A patient with mild systemic                            disease. After reviewing the risks and benefits,                            the patient was deemed in satisfactory condition to  undergo the procedure.                           After obtaining informed consent, the endoscope was                            passed under direct vision. Throughout the                            procedure, the patient's blood pressure, pulse, and                            oxygen saturations were monitored continuously. The                            Olympus scope [id number] was introduced  through the                            mouth, and advanced to the second part of duodenum.                            The upper GI endoscopy was accomplished without                            difficulty. The patient tolerated the procedure                            well. Findings:                 The lower third of the esophagus was mildly                            tortuous.                           The exam of the esophagus was otherwise normal.                           A deformity was found on the greater curvature of                            the gastric antrum characterized by a 'tethered'                            erythematous segment of mucosa. Biopsies were taken                            with a cold forceps for Helicobacter pylori                            testing. Estimated blood loss was minimal.                           The exam of the stomach was otherwise normal.  Biopsies were taken with a cold forceps in the                            gastric antrum for Helicobacter pylori testing.                            Estimated blood loss was minimal.                           The examined duodenum was normal. Complications:            No immediate complications. Estimated Blood Loss:     Estimated blood loss was minimal. Impression:               - Tortuous esophagus. No esophagitis present.                           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 Biopsied. This area had the appearance                            of a healing ulcer. The mucosa here was                            erythematous, but there was no ulceration.                           - Normal examined duodenum.                           - Biopsies were taken with a cold forceps for                            Helicobacter pylori testing. Recommendation:           - Patient has a contact number available for                             emergencies. The signs and symptoms of potential                            delayed complications were discussed with the                            patient. Return to normal activities tomorrow.                            Written discharge instructions were provided to the                            patient.                           - Resume previous diet.                           -  Continue present medications.                           - Await pathology results.                           - Follow up as needed in the office for ongoing                            managment of chronic GI symptoms.                           ***************DUE TO WORLDWIDE INTERNET OUTAGE AT                            TIME OF THE PROCEDURE, NO PHOTOGRAPHS WERE ABLE TO                            BE RECORDED.**************************  E. Tomasa [name], MD 03/07/2023 7:43:37 PM This report has been signed electronically.

## 2023-03-08 NOTE — Telephone Encounter (Signed)
  Follow up Call-     03/05/2023    2:38 PM  Call back number  Post procedure Call Back phone  # [phone]  Permission to leave phone message Yes     Patient questions:  Do you have a fever, pain , or abdominal swelling? No. Pain Score  0 *  Have you tolerated food without any problems? Yes.    Have you been able to return to your normal activities? Yes.    Do you have any questions about your discharge instructions: Diet   No. Medications  No. Follow up visit  No.  Do you have questions or concerns about your Care? No.  Actions: * If pain score is 4 or above: No action needed, pain <4.

## 2023-03-13 NOTE — Progress Notes (Signed)
Mr. Newingham, The biopsies of the deformity in your stomach showed evidence of scar tissue.  This is most consistent with a history of an ulcer, which has healed.  Biopsies of this and the rest of your stomach did not reveal any evidence of H. pylori infection. I recommend you avoid taking over-the-counter pain medicines (NSAIDs), as these medications are known to cause stomach ulcers.  The polyp which I removed during your colonoscopy was proven to be completely benign but is considered a "pre-cancerous" polyp that MAY have grown into cancer if it had not been removed.  Studies shows that at least 20% of women over age 97 and 30% of men over age 89 have pre-cancerous polyps.  Based on current nationally recognized surveillance guidelines, I recommend that you have a repeat colonoscopy in 7 years.   If you develop any new rectal bleeding, abdominal pain or significant bowel habit changes, please contact me before then.

## 2023-03-16 NOTE — Telephone Encounter (Signed)
Patient has been schedule.

## 2023-03-17 ENCOUNTER — Ambulatory Visit (INDEPENDENT_AMBULATORY_CARE_PROVIDER_SITE_OTHER): Payer: Medicaid Other | Admitting: Internal Medicine

## 2023-03-17 VITALS — BP 128/74 | HR 88 | Resp 20 | Ht 67.0 in | Wt 176.0 lb

## 2023-03-17 DIAGNOSIS — L720 Epidermal cyst: Secondary | ICD-10-CM

## 2023-03-17 NOTE — Progress Notes (Signed)
Subjective:    Patient ID: Ryan Hensley, male   DOB: 08/25/1970, 52 y.o.   MRN: [id number]   HPI Here for small cyst removal, left posterior/lateral axilla Informed consent signed    Current Meds  Medication Sig   acetaminophen (TYLENOL) 500 MG tablet Take 500 mg by mouth every 6 (six) hours as needed. Takes 2 tablet once a day   AgaMatrix Ultra-Thin Lancets MISC Check blood glucose twice daily before meals   aspirin EC 81 MG tablet 1 tab by mouth daily with morning meal   atorvastatin (LIPITOR) 80 MG tablet TAKE 1 TABLET BY MOUTH WITH EVENING MEAL   Blood Glucose Monitoring Suppl (AGAMATRIX PRESTO) w/Device KIT Check blood sugars twice daily before meals   buPROPion (WELLBUTRIN SR) 150 MG 12 hr tablet 1 tab by mouth in morning daily for 3 days then increase to 1 tab by mouth twice daily on day 4   empagliflozin (JARDIANCE) 10 MG TABS tablet Take 1 tablet (10 mg total) by mouth daily before breakfast.   famotidine (PEPCID) 20 MG tablet 2 tabs by mouth at bedtime   finasteride (PROSCAR) 5 MG tablet Take 1 tablet (5 mg total) by mouth daily.   gabapentin (NEURONTIN) 100 MG capsule Continue 3 caps by mouth at bedtime and add another 1 cap in morning and increase by 1 cap every 3 days until taking 3 caps 3 times daily   glipiZIDE (GLUCOTROL) 5 MG tablet TAKE 1 TABLET BY MOUTH TWICE DAILY WITH MEALS   glucose blood (AGAMATRIX PRESTO TEST) test strip Check blood blood sugar twice daily before meals   ibuprofen (ADVIL) 400 MG tablet Take 1 tablet (400 mg total) by mouth every 8 (eight) hours as needed for moderate pain.   insulin glargine (LANTUS SOLOSTAR) 100 UNIT/ML Solostar Pen INJECT 15 UNITS SUBCUTANEOUS IN THE MORNING BEFORE BREAKFAST   metFORMIN (GLUCOPHAGE) 1000 MG tablet TAKE 1 TABLET BY MOUTH TWICE DAILY WITH A MEAL   metoCLOPramide (REGLAN) 5 MG tablet 1 tab by mouth before meal 3 times daily.   tamsulosin (FLOMAX) 0.4 MG CAPS capsule 1 tab by mouth with evening meal.   vitamin  B-12 (CYANOCOBALAMIN) 50 MCG tablet Take 50 mcg by mouth. 1 a day   Allergies  Allergen Reactions   Shellfish Allergy Swelling     Review of Systems    Objective:   BP 128/74 (BP Location: Left Arm, Patient Position: Sitting, Cuff Size: Normal)   Pulse 88   Resp 20   Ht 5\' 7"  (1.702 m)   Wt 176 lb (79.8 kg)   BMI 27.57 kg/m   Physical Exam Skin excision  Date/Time: 03/17/2023 12:19 PM  Performed by: Julieanne Manson, MD Authorized by: Julieanne Manson, MD   Number of Lesions: 1 Lesion 1:    Body area: upper extremity   Upper extremity location: left axilla.   Initial size (mm): 10   Final defect size (mm): 10   Malignancy: benign lesion     Destruction method comment: 15 blade used for elliptical excision and total removal of subcutaneous cyst.  Hyfrecator used for hemostasis.  3 simple interruped 4-0 ethilon sutures used for wound closure with good hemostasis.  Dressing applied.   Repair type: linear closure   Closure complexity: simple     Repair size (cm): 1.5   Repair comments: Tolerated procedure well without complication    Assessment & Plan  Epidermoid cyst of left axilla:  excisional removal.   Went over wound care and  signs of infection or bleeding. Return in 7 days for suture removal.

## 2023-03-17 NOTE — Patient Instructions (Signed)
Call for redness, swelling, discharge, increased pain, bleeding, fever Do not get wound wet for 3 days and then may allow water to run over area, but no scrubbing. Change dressing daily as discussed--apply bacitracin ointment over wound before applying dressing to avoid dressing sticking to wound.

## 2023-03-19 ENCOUNTER — Other Ambulatory Visit
Admission: RE | Admit: 2023-03-19 | Discharge: 2023-03-19 | Disposition: A | Discharge status: Home / Self Care | Payer: Medicaid Other | Source: Ambulatory Visit | Attending: Urology | Admitting: Urology

## 2023-03-19 ENCOUNTER — Other Ambulatory Visit: Payer: Self-pay

## 2023-03-19 ENCOUNTER — Ambulatory Visit: Payer: Medicaid Other | Admitting: Urology

## 2023-03-19 VITALS — BP 131/89 | HR 80 | Ht 70.0 in | Wt 177.0 lb

## 2023-03-19 DIAGNOSIS — R972 Elevated prostate specific antigen [PSA]: Secondary | ICD-10-CM | POA: Diagnosis not present

## 2023-03-19 DIAGNOSIS — R3916 Straining to void: Secondary | ICD-10-CM | POA: Insufficient documentation

## 2023-03-19 DIAGNOSIS — N401 Enlarged prostate with lower urinary tract symptoms: Secondary | ICD-10-CM

## 2023-03-19 DIAGNOSIS — N528 Other male erectile dysfunction: Secondary | ICD-10-CM | POA: Diagnosis not present

## 2023-03-19 LAB — URINALYSIS, COMPLETE (UACMP) WITH MICROSCOPIC
Bacteria, UA: NONE SEEN
Bilirubin Urine: NEGATIVE
Glucose, UA: 500 mg/dL — AB
Hgb urine dipstick: NEGATIVE
Ketones, ur: NEGATIVE mg/dL
Leukocytes,Ua: NEGATIVE
Nitrite: NEGATIVE
Protein, ur: NEGATIVE mg/dL
Specific Gravity, Urine: 1.02 (ref 1.005–1.030)
Squamous Epithelial / HPF: NONE SEEN /HPF (ref 0–5)
pH: 5.5 (ref 5.0–8.0)

## 2023-03-19 LAB — BLADDER SCAN AMB NON-IMAGING: Scan Result: 4

## 2023-03-19 MED ORDER — SILDENAFIL CITRATE 20 MG PO TABS
ORAL_TABLET | ORAL | 11 refills | Status: DC
Start: 2023-03-19 — End: 2024-03-22

## 2023-03-19 NOTE — Progress Notes (Signed)
Marcelle Overlie Plume,acting as a scribe for Vanna Scotland, MD.,have documented all relevant documentation on the behalf of Vanna Scotland, MD,as directed by  Vanna Scotland, MD while in the presence of Vanna Scotland, MD.  03/19/2023 10:30 AM   Tressie Ellis 02-Oct-1970 [id number]  Referring provider: Julieanne Manson, MD [address] [place],  [place] [address]  Chief Complaint Patient presents with  Establish Care  Benign Prostatic Hypertrophy   HPI: 52 year-old male who presents today for further evaluation of urinary issues. He was referred back in May by his PCP, Dr. Julieanne Manson. Specifically then, he was referred for elevated PSA, which was 5.6 in 10/2022. He also had a low percent free PSA placing him in the higher risk category, 56% chance of clinically significant prostate cancer based on this. He is also on Flomax that was started in 01/2023, as well as finasteride started in 10/2022 for his urinary symptoms.   Today, he reports that he strains to urinate at times, feels like he is unable to completely empty, and has a weak stream. He also reports erectile dysfunction.  He denies a family history of prostate cancer. He has noticed no improvement in his symptoms since starting the medications.  He reports that he does have a personal history of diabetes. He notes that his erectile symptoms developed shortly after that diagnosis.  He states that he achieves only a partial erection.    Prostate Specific Ag, Serum Latest Ref Rng 0.0 - 4.0 ng/mL 02/16/2022 4.9 (H)  10/13/2022 5.2 (H)  10/22/2022 5.6 (H)      Results for orders placed or performed during the hospital encounter of 03/19/23 Urinalysis, Complete w Microscopic - Result Value Ref Range Color, Urine YELLOW YELLOW APPearance CLEAR CLEAR Specific Gravity, Urine 1.020 1.005 - 1.030 pH 5.5 5.0 - 8.0 Glucose, UA 500 (A) NEGATIVE mg/dL Hgb urine dipstick NEGATIVE NEGATIVE Bilirubin  Urine NEGATIVE NEGATIVE Ketones, ur NEGATIVE NEGATIVE mg/dL Protein, ur NEGATIVE NEGATIVE mg/dL Nitrite NEGATIVE NEGATIVE Leukocytes,Ua NEGATIVE NEGATIVE Squamous Epithelial / HPF NONE SEEN 0 - 5 /HPF WBC, UA 0-5 0 - 5 WBC/hpf RBC / HPF 0-5 0 - 5 RBC/hpf Bacteria, UA NONE SEEN NONE SEEN Mucus PRESENT Results for orders placed or performed in visit on 03/19/23 Bladder Scan (Post Void Residual) in office Result Value Ref Range Scan Result 4 ml  IPSS  Row Name 03/19/23 0900 International Prostate Symptom Score How often have you had the sensation of not emptying your bladder? About half the time How often have you had to urinate less than every two hours? Less than 1 in 5 times How often have you found you stopped and started again several times when you urinated? Not at All How often have you found it difficult to postpone urination? Not at All How often have you had a weak urinary stream? Less than 1 in 5 times How often have you had to strain to start urination? More than half the time How many times did you typically get up at night to urinate? 1 Time Total IPSS Score 10 Quality of Life due to urinary symptoms If you were to spend the rest of your life with your urinary condition just the way it is now how would you feel about that? Unhappy    Score:  1-7 Mild 8-19 Moderate 20-35 Severe    PMH: Past Medical History: Diagnosis Date  Diabetes mellitus without complication (HCC)  GERD (gastroesophageal reflux disease)  Hyperlipidemia  Left eye injury, sequela  1978   Surgical History: Past Surgical History: Procedure Laterality Date  boxer fracture ORIF Right 2016  EYE SURGERY 2007 Strabismus surgery, left eye.  Age 52 yo.   Home Medications:  Allergies as of 03/19/2023     Reactions Shellfish Allergy Swelling   Medication List   Accurate as of March 19, 2023 10:30 AM. If you have any questions, ask your nurse  or doctor.   acetaminophen 500 MG tablet Commonly known as: TYLENOL Take 500 mg by mouth every 6 (six) hours as needed. Takes 2 tablet once a day AgaMatrix Presto Test test strip Generic drug: glucose blood Check blood blood sugar twice daily before meals AgaMatrix Presto w/Device Kit Check blood sugars twice daily before meals AgaMatrix Ultra-Thin Lancets Misc Check blood glucose twice daily before meals aspirin EC 81 MG tablet 1 tab by mouth daily with morning meal atorvastatin 80 MG tablet Commonly known as: LIPITOR TAKE 1 TABLET BY MOUTH WITH EVENING MEAL buPROPion 150 MG 12 hr tablet Commonly known as: WELLBUTRIN SR 1 tab by mouth in morning daily for 3 days then increase to 1 tab by mouth twice daily on day 4 Dexcom G6 Sensor Misc Blood glucose check 3 times daily before meals and as needed. Dexcom G6 Transmitter Misc 1 Units by Does not apply route 3 (three) times daily before meals. diclofenac 75 MG EC tablet Commonly known as: VOLTAREN Take 1 tablet (75 mg total) by mouth 2 (two) times daily. empagliflozin 10 MG Tabs tablet Commonly known as: Jardiance Take 1 tablet (10 mg total) by mouth daily before breakfast. famotidine 20 MG tablet Commonly known as: Pepcid 2 tabs by mouth at bedtime finasteride 5 MG tablet Commonly known as: Proscar Take 1 tablet (5 mg total) by mouth daily. gabapentin 100 MG capsule Commonly known as: NEURONTIN Continue 3 caps by mouth at bedtime and add another 1 cap in morning and increase by 1 cap every 3 days until taking 3 caps 3 times daily glipiZIDE 5 MG tablet Commonly known as: GLUCOTROL TAKE 1 TABLET BY MOUTH TWICE DAILY WITH MEALS ibuprofen 400 MG tablet Commonly known as: ADVIL Take 1 tablet (400 mg total) by mouth every 8 (eight) hours as needed for moderate pain. Lantus SoloStar 100 UNIT/ML Solostar Pen Generic drug: insulin glargine INJECT 15 UNITS SUBCUTANEOUS IN THE MORNING BEFORE BREAKFAST metFORMIN 1000 MG  tablet Commonly known as: GLUCOPHAGE TAKE 1 TABLET BY MOUTH TWICE DAILY WITH A MEAL metoCLOPramide 10 MG tablet Commonly known as: REGLAN Take 5 mg by mouth 3 (three) times daily. metoCLOPramide 5 MG tablet Commonly known as: REGLAN 1 tab by mouth before meal 3 times daily. polyethylene glycol 17 g packet Commonly known as: MIRALAX / GLYCOLAX Take 17 g by mouth daily. sildenafil 20 MG tablet Commonly known as: Revatio Take 1 to 5 tablets 1 hour prior to intercourse as needed Started by: Vanna Scotland tamsulosin 0.4 MG Caps capsule Commonly known as: FLOMAX 1 tab by mouth with evening meal. vitamin B-12 50 MCG tablet Commonly known as: CYANOCOBALAMIN Take 50 mcg by mouth. 1 a day    Allergies:  Allergies Allergen Reactions  Shellfish Allergy Swelling   Family History: Family History Problem Relation Age of Onset  Hypertension Mother   Social History:  reports that he has been smoking cigarettes. He has a 9.3 pack-year smoking history. He has been exposed to tobacco smoke. He has never used smokeless tobacco. He reports current alcohol use. He reports that he does not use drugs.  Physical Exam: BP 131/89   Pulse 80   Ht 5\' 10"  (1.778 m)   Wt 177 lb (80.3 kg)   BMI 25.40 kg/m   Constitutional:  Alert and oriented, No acute distress. HEENT: [place] AT, moist mucus membranes.  Trachea midline, no masses. GU: Slightly decreased sphincter tone, 30 gram prostate, non-tender, no nodules Neurologic: Grossly intact, no focal deficits, moving all 4 extremities. Psychiatric: Normal mood and affect.   Assessment & Plan:    1. Elevated PSA -  We reviewed the implications of an elevated PSA and the uncertainty surrounding it. In general, a man's PSA increases with age and is produced by both normal and cancerous prostate tissue. Differential for elevated PSA is BPH, prostate cancer, infection, recent intercourse/ejaculation, prostate infarction, recent urethroscopic  manipulation (foley placement/cystoscopy) and prostatitis. Management of an elevated PSA can include observation or prostate biopsy and wediscussed this in detail. We discussed that indications for prostate biopsy are defined by age and race specific PSA cutoffs as well as a PSA velocity of 0.75/year. - We discussed prostate biopsy in detail including the procedure itself, the risks of blood in the urine, stool, and ejaculate, serious infection, and discomfort. He is willing to proceed with this as discussed. - Recommend prostate biopsy at this time  2. Erectile dysfunction - We discussed the pathophysiology of erectile dysfunction today along with possible contributing factors. Discussed possible treatment options including PDE 5 inhibitors, vacuum erectile device, intracavernosal injection, MUSE, and placement of the inflatable or malleable penile prosthesis for refractory cases.  In terms of PDE 5 inhibitors, we discussed contraindications for this medication as well as common side effects. Patient was counseled on optimal use. All of his questions were answered in detail. - Sildenafil 20 mg 1-5 tablets,  1-2 hours prior to intercourse  3. 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  I have reviewed the above documentation for accuracy and completeness, and I agree with the above.   Vanna Scotland, MD   [facility] [address] [place], [place] [address] [phone]

## 2023-03-19 NOTE — Patient Instructions (Signed)
Prostate Biopsy Instructions  Stop all aspirin or blood thinners (aspirin, plavix, coumadin, warfarin, motrin, ibuprofen, advil, aleve, naproxen, naprosyn) for 7 days prior to the procedure.  If you have any questions about stopping these medications, please contact your primary care physician or cardiologist.  Having a light meal prior to the procedure is recommended.  If you are diabetic or have low blood sugar please bring a small snack or glucose tablet.  A Fleets enema is needed to be purchased over the counter at a local pharmacy and used 2 hours before you scheduled appointment.  This can be purchased over the counter at any pharmacy.  Antibiotics will be administered in the clinic at the time of the procedure unless otherwise specified.    Please bring someone with you to the procedure to drive you home.  A follow up appointment has been scheduled for you to receive the results of the biopsy.  If you have any questions or concerns, please feel free to call the office at [phone] or send a Mychart message.    Thank you, Staff at [facility]   Transrectal Prostate Biopsy Patient Education and Post Procedure Instructions       -Definition A prostate biopsy is the removal of a small amount of tissue from the prostate gland. The tissue is examined to determine whether there is cancer.   -Reasons for Procedure A prostate biopsy is usually done after an abnormal finding by: Digital rectal exam Prostate specific antigen (PSA) blood test A prostate biopsy is the only way to find out if cancer cells are present.   -Possible Complications Problems from the procedure are rare, but all procedures have some risk including: Infection Bruising or lengthy bleeding from the rectum, or in urine or semen Difficulty urinating Reactions to anesthesia Factors that may increase the risk of complications include: Smoking History of bleeding disorders or easy bruising Use of  any medications, over-the-counter medications, or herbal supplements Sensitivity or allergy to latex, medications, or anesthesia.   -Prior to Procedure Talk to your doctor about your medications. Blood thinning medications including aspirin should be stopped 1 week prior to procedure. If prescribed by your cardiologist we may need approval before stopping medications. Use a Fleets enema 2 hours before the procedure. Can be purchased at your pharmacy. Antibiotics will be administered in the clinic prior to procedure.  Please make sure you eat a light meal prior to coming in for your appointment. This can help prevent lightheadedness during the procedure and upset stomach from antibiotics. Please bring someone with you to the procedure to drive you home.   -Anesthesia Transrectal biopsy: Local anesthesia--Just the area that is being operated on is numbed using an injectable anesthetic.   -Description of the Procedure Transrectal biopsy--Your doctor will insert a small ultrasound device into the rectum. This device will produce sound waves to create an image of the prostate. These images will help guide placement of the needle. Your doctor will then insert the needle through the wall of the rectum and into the prostate gland. The procedure should take approximately 15-30 minutes.   -Will It Hurt? You may have discomfort and soreness at the biopsy site. Pain and discomfort after the procedure can be managed with medications.   -Postoperative Care When you return home after the procedure, do the following to help ensure a smooth recovery: Stay hydrated. Drink plenty of fluids for the next few days. Avoid difficult physical activity the day and evening of the  procedure. Keep in mind that you may see blood in your urine, stool, or semen for several days. Resume any medications that were stopped when you are advised to do so.   After the sample is taken, it will be sent to a pathologist for  examination under a microscope. This doctor will analyze the sample for cancer. You will be scheduled for an appointment to discuss results. If cancer is present, your doctor will work with you to develop a treatment plan.     -Call Your Doctor or Seek Immediate Medical Attention It is important to monitor your recovery. Alert your doctor to any problems. If any of the following occur, call your doctor or go to the emergency room: Fever 100.5 or greater within 1 week post procedure go directly to ER Call the office for: Blood in the urine more than 1 week or in semen for more than 6 weeks post-biopsy Pain that you cannot control with the medications you have been given Pain, burning, urgency, or frequency of urination Cough, shortness of breath, or chest pain- if severe go to ER Heavy rectal bleeding or bleeding that lasts more than 1 week after the biopsy If you have any questions or concerns please contact our office at [facility]   [facility] [address] [place], [place] [address] [phone]

## 2023-03-24 ENCOUNTER — Ambulatory Visit: Payer: Medicaid Other | Admitting: Internal Medicine

## 2023-03-24 ENCOUNTER — Encounter: Payer: Self-pay | Admitting: Internal Medicine

## 2023-03-24 VITALS — BP 124/84 | HR 88 | Resp 20 | Ht 69.0 in | Wt 176.0 lb

## 2023-03-24 DIAGNOSIS — Z4802 Encounter for removal of sutures: Secondary | ICD-10-CM

## 2023-03-24 NOTE — Progress Notes (Signed)
Wound in left axilla well healed without erythema or discharge.  3 interrupted sutures removed. Tolerated well  Discussed no scrubbing at wound and to avoid stretching of area.

## 2023-03-25 ENCOUNTER — Ambulatory Visit: Payer: Medicaid Other | Admitting: Internal Medicine

## 2023-04-07 ENCOUNTER — Ambulatory Visit: Payer: Medicaid Other | Attending: Cardiovascular Disease | Admitting: Cardiovascular Disease

## 2023-04-13 ENCOUNTER — Ambulatory Visit (INDEPENDENT_AMBULATORY_CARE_PROVIDER_SITE_OTHER): Payer: Medicaid Other | Admitting: Urology

## 2023-04-13 VITALS — BP 86/56 | HR 156 | Ht 69.0 in | Wt 179.0 lb

## 2023-04-13 DIAGNOSIS — Z2989 Encounter for other specified prophylactic measures: Secondary | ICD-10-CM

## 2023-04-13 DIAGNOSIS — C61 Malignant neoplasm of prostate: Secondary | ICD-10-CM

## 2023-04-13 DIAGNOSIS — R972 Elevated prostate specific antigen [PSA]: Secondary | ICD-10-CM

## 2023-04-13 MED ORDER — LEVOFLOXACIN 500 MG PO TABS
500.0000 mg | ORAL_TABLET | Freq: Once | ORAL | Status: AC
Start: 2023-04-13 — End: 2023-04-13
  Administered 2023-04-13: 500 mg via ORAL

## 2023-04-13 MED ORDER — GENTAMICIN SULFATE 40 MG/ML IJ SOLN
80.0000 mg | Freq: Once | INTRAMUSCULAR | Status: AC
Start: 2023-04-13 — End: 2023-04-13
  Administered 2023-04-13: 80 mg via INTRAMUSCULAR

## 2023-04-13 NOTE — Progress Notes (Unsigned)
   04/13/23  CC:  Chief Complaint  Patient presents with   Prostate Biopsy    HPI: 52 year old male who presents today for prostate biopsy.  He has a personal history of elevated/rising PSA.  NED. A&Ox3.   No respiratory distress   Abd soft, NT, ND Normal sphincter tone  Prostate Biopsy Procedure   Informed consent was obtained after discussing risks/benefits of the procedure.  A time out was performed to ensure correct patient identity.  Pre-Procedure: - Gentamicin given prophylactically - Levaquin 500 mg administered PO -Transrectal Ultrasound performed revealing a 32.7 gm prostate -No significant hypoechoic or median lobe noted  Procedure: - Prostate block performed using 10 cc 1% lidocaine and biopsies taken from sextant areas, a total of 12 under ultrasound guidance.  Post-Procedure: - Patient tolerated the procedure well - He was counseled to seek immediate medical attention if experiences any severe pain, significant bleeding, or fevers - Return in one week to discuss biopsy results   Vanna Scotland, MD

## 2023-04-21 ENCOUNTER — Encounter: Payer: Self-pay | Admitting: Urology

## 2023-04-21 ENCOUNTER — Ambulatory Visit (INDEPENDENT_AMBULATORY_CARE_PROVIDER_SITE_OTHER): Payer: Medicaid Other | Admitting: Urology

## 2023-04-21 VITALS — BP 122/83 | HR 88

## 2023-04-21 DIAGNOSIS — M6289 Other specified disorders of muscle: Secondary | ICD-10-CM | POA: Diagnosis not present

## 2023-04-21 DIAGNOSIS — N529 Male erectile dysfunction, unspecified: Secondary | ICD-10-CM | POA: Diagnosis not present

## 2023-04-21 DIAGNOSIS — C61 Malignant neoplasm of prostate: Secondary | ICD-10-CM | POA: Diagnosis not present

## 2023-04-21 NOTE — Patient Instructions (Signed)
Robot-Assisted Laparoscopic Radical Prostatectomy  Robot-assisted laparoscopic radical prostatectomy is surgery done to remove the entire prostate and nearby tissue. This includes the seminal vesicles, which are near the bladder and the prostate. This procedure is done to treat prostate cancer that has not spread (metastasized) to other parts of the body. The goal of the surgery is to remove all cancer cells to help keep the cancer from metastasizing. During this procedure, the surgeon makes several incisions in the abdomen instead of one large incision. A long, thin, lighted tube with a tiny camera on the end (laparoscope) is put into one of the incisions. This allows the surgeon to see inside the abdomen. Other surgical tools are put in through the other incisions and used to take out the prostate and nearby tissues. The surgeon uses robotic arms to control these tools while sitting at a computer near the operating table. Lymph nodes in the pelvis may also be removed. Lymph nodes are part of the body's disease-fighting system (immune system). When prostate cancer spreads, it tends to go to the lymph nodes in the pelvis first. If the pelvic lymph nodes are removed, they will be checked for cancer cells. Tell a health care provider about: Any allergies you have. All medicines you are taking, including vitamins, herbs, eye drops, creams, and over-the-counter medicines. Any problems you or family members have had with anesthetic medicines. Any bleeding problems you have. Any surgeries you have had. Any medical conditions you have. Any prostate infections you have had. What are the risks? Generally, this is a safe procedure. Still, problems may occur, including: Infection. Bleeding. Allergic reactions to medicines. Damage to nearby structures or organs, such as the rectum, ureters, urethra, bladder, or small intestine. Blockage (obstruction) of the large or small intestines. Problems that affect  urination or sexual function. These may include: Narrowing or scarring of the urethra (stricture), which may block the flow of urine. Inability to control when you urinate (incontinence). Inability to get or keep an erection (erectile dysfunction). Dry ejaculation. This is when no semen comes out during orgasm. The formation of a sac (cyst) in the pelvis that is filled with fluid from the lymph glands (lymphocele). Blood clots in the legs. What happens before the procedure? Staying hydrated Follow instructions from your health care provider about hydration, which may include: Up to 2 hours before the procedure - you may continue to drink clear liquids, such as water, clear fruit juice, black coffee, and plain tea.  Eating and drinking restrictions Follow instructions from your health care provider about eating and drinking, which may include: 8 hours before the procedure - stop eating heavy meals or foods, such as meat, fried foods, or fatty foods. 6 hours before the procedure - stop eating light meals or foods, such as toast or cereal. 6 hours before the procedure - stop drinking milk or drinks that contain milk. 2 hours before the procedure - stop drinking clear liquids. Medicines Ask your health care provider about: Changing or stopping your regular medicines. This is especially important if you are taking diabetes medicines or blood thinners. Taking medicines such as aspirin and ibuprofen. These medicines can thin your blood. Do not take these medicines unless your health care provider tells you to take them. Taking over-the-counter medicines, vitamins, herbs, and supplements. Follow your health care provider's instructions about cleaning out your bowels. Surgery safety Ask your health care provider: How your surgery site will be marked. What steps will be taken to help prevent infection.  These steps may include: Removing hair at the surgery site. Washing skin with a germ-killing  soap. Taking antibiotic medicine. General instructions Do not use any products that contain nicotine or tobacco for at least 4 weeks before the procedure. These products include cigarettes, chewing tobacco, and vaping devices, such as e-cigarettes. If you need help quitting, ask your health care provider. Plan to have a responsible adult take you home from the hospital or clinic. Plan to have a responsible adult care for you for the time you are told after you leave the hospital or clinic. You may have an exam or testing. This may include blood or urine samples, or imaging tests such as a CT scan or an MRI. What happens during the procedure? An IV will be put into a vein in your hand or arm. You may be given: A medicine to help you relax (sedative). A medicine to make you fall asleep (general anesthetic). A thin, flexible tube (Foley catheter) will be put into your penis through your urethra and into your bladder to drain your urine. Small incisions will be made in your abdomen and near your belly button. The laparoscope and other surgical instruments will be put through the incisions. The surgical tools will be used to cut and remove your prostate, seminal vesicles, and maybe your pelvic lymph nodes. Your surgeon will use a computer and robotic arms to control the surgical instruments. Your urethra will be cut and separated from your bladder to take out the prostate. Your urethra will then be reconnected to your bladder neck. This is the group of muscles that help push urine through your urethra. A small tube (drain) may be put in one or more of your incisions to help drain extra fluid from your surgical site after surgery. The laparoscope and other surgical instruments will be removed. Your incisions will be closed with stitches (sutures), skin glue, or adhesive strips. Medicine may be applied and bandages (dressings) will be placed over your incisions. The procedure may vary among health care  providers and hospitals. What happens after the procedure? Your blood pressure, heart rate, breathing rate, and blood oxygen level will be monitored until you leave the hospital or clinic. You may get fluids and medicines through your IV. You may be given antibiotics and medicines to help relieve pain or nausea. You will be encouraged to walk as soon as possible. You will also use a device or do breathing exercises to keep your lungs clear. The catheter will stay in to drain urine from your bladder. You will be taught how to care for it at home. The drain may stay in to drain fluid from the surgical site. If so, you will be taught how to care for it at home. You may need to wear compression stockings until you are able to get up and walk around. These stockings help prevent blood clots and reduce swelling in your legs. If you were given a sedative during the procedure, it can affect you for several hours. Do not drive or operate machinery until your health care provider says that it is safe. Summary Robot-assisted laparoscopic radical prostatectomy is a surgical procedure to remove the entire prostate and the seminal vesicles. Follow instructions from your health care provider about eating and drinking before your surgery. After your procedure, you may be given fluids and medicines through an IV. You may get antibiotics and medicines to help relieve pain or nausea. After your surgery, you will continue to have a small,  thin tube (Foley catheter) draining your urine. You will be taught how to care for it at home. This information is not intended to replace advice given to you by your health care provider. Make sure you discuss any questions you have with your health care provider. Document Revised: 10/30/2020 Document Reviewed: 10/30/2020 Elsevier Patient Education  2024 [employer].

## 2023-04-21 NOTE — Progress Notes (Signed)
Marcelle Overlie Plume,acting as a scribe for Vanna Scotland, MD.,have documented all relevant documentation on the behalf of Vanna Scotland, MD,as directed by  Vanna Scotland, MD while in the presence of Vanna Scotland, MD.  04/21/2023 5:05 PM   Ryan Hensley November 26, 1970 [id number]  Referring provider: Julieanne Manson, MD [address] [place],  [place] [address]  Chief Complaint  Patient presents with   Follow-up    HPI: 52 year-old male with a personal history of elevated rising PSA.   He presents today to discuss a new diagnosis of prostate cancer. He was known to have an elevated PSA to 5.6. He does have significant baseline urinary symptoms. He had a normal rectal exam. He underwent prostate biopsy on 04/13/2023 and it showed a 32.7 gram prostate, no median lobe. Biopsy revealed bilateral prostate cancer involving 4 of 6 cores on the right, Gleason 3+3, and 2 of 6 cores on the left, one of which was 4+3 to 30% of the tissue.   His urinary symptoms have recently been managed on Flomax and finasteride.   He also has fairly significant baseline erectile dysfunction.  Past medical history is significant for diabetes. His most recent hemoglobin A1C was poorly controlled at 9.4.    He has one daughter. He does not smoke or use recreational drugs.         IPSS     Row Name 04/21/23 1600         International Prostate Symptom Score   How often have you had the sensation of not emptying your bladder? About half the time     How often have you had to urinate less than every two hours? Not at All     How often have you found you stopped and started again several times when you urinated? Less than half the time     How often have you had a weak urinary stream? Less than half the time     How often have you had to strain to start urination? Not at All     How many times did you typically get up at night to urinate? None     Total IPSS Score 7       Quality of Life due to urinary  symptoms   If you were to spend the rest of your life with your urinary condition just the way it is now how would you feel about that? Terrible              Score:  1-7 Mild 8-19 Moderate 20-35 Severe  SHIM     Row Name 04/21/23 1614         SHIM: Over the last 6 months:   How do you rate your confidence that you could get and keep an erection? Very Low     When you had erections with sexual stimulation, how often were your erections hard enough for penetration (entering your partner)? Almost Never or Never     During sexual intercourse, how often were you able to maintain your erection after you had penetrated (entered) your partner? Almost Never or Never     During sexual intercourse, how difficult was it to maintain your erection to completion of intercourse? Difficult     When you attempted sexual intercourse, how often was it satisfactory for you? Almost Never or Never       SHIM Total Score   SHIM 7  PMH: Past Medical History:  Diagnosis Date   Diabetes mellitus without complication (HCC)    GERD (gastroesophageal reflux disease)    Hyperlipidemia    Left eye injury, sequela 1978    Surgical History: Past Surgical History:  Procedure Laterality Date   boxer fracture ORIF Right 2016   EYE SURGERY  2007   Strabismus surgery, left eye.  Age 56 yo.    Home Medications:  Allergies as of 04/21/2023       Reactions   Shellfish Allergy Swelling        Medication List        Accurate as of April 21, 2023  5:05 PM. If you have any questions, ask your nurse or doctor.          acetaminophen 500 MG tablet Commonly known as: TYLENOL Take 500 mg by mouth every 6 (six) hours as needed. Takes 2 tablet once a day   AgaMatrix Presto Test test strip Generic drug: glucose blood Check blood blood sugar twice daily before meals   AgaMatrix Presto w/Device Kit Check blood sugars twice daily before meals   AgaMatrix Ultra-Thin Lancets  Misc Check blood glucose twice daily before meals   aspirin EC 81 MG tablet 1 tab by mouth daily with morning meal   atorvastatin 80 MG tablet Commonly known as: LIPITOR TAKE 1 TABLET BY MOUTH WITH EVENING MEAL   buPROPion 150 MG 12 hr tablet Commonly known as: WELLBUTRIN SR 1 tab by mouth in morning daily for 3 days then increase to 1 tab by mouth twice daily on day 4   Dexcom G6 Sensor Misc Blood glucose check 3 times daily before meals and as needed.   Dexcom G6 Transmitter Misc 1 Units by Does not apply route 3 (three) times daily before meals.   diclofenac 75 MG EC tablet Commonly known as: VOLTAREN Take 1 tablet (75 mg total) by mouth 2 (two) times daily.   empagliflozin 10 MG Tabs tablet Commonly known as: Jardiance Take 1 tablet (10 mg total) by mouth daily before breakfast.   famotidine 20 MG tablet Commonly known as: Pepcid 2 tabs by mouth at bedtime   finasteride 5 MG tablet Commonly known as: Proscar Take 1 tablet (5 mg total) by mouth daily.   gabapentin 100 MG capsule Commonly known as: NEURONTIN Continue 3 caps by mouth at bedtime and add another 1 cap in morning and increase by 1 cap every 3 days until taking 3 caps 3 times daily   glipiZIDE 5 MG tablet Commonly known as: GLUCOTROL TAKE 1 TABLET BY MOUTH TWICE DAILY WITH MEALS   ibuprofen 400 MG tablet Commonly known as: ADVIL Take 1 tablet (400 mg total) by mouth every 8 (eight) hours as needed for moderate pain.   Lantus SoloStar 100 UNIT/ML Solostar Pen Generic drug: insulin glargine INJECT 15 UNITS SUBCUTANEOUS IN THE MORNING BEFORE BREAKFAST   metFORMIN 1000 MG tablet Commonly known as: GLUCOPHAGE TAKE 1 TABLET BY MOUTH TWICE DAILY WITH A MEAL   metoCLOPramide 10 MG tablet Commonly known as: REGLAN Take 5 mg by mouth 3 (three) times daily.   metoCLOPramide 5 MG tablet Commonly known as: REGLAN 1 tab by mouth before meal 3 times daily.   polyethylene glycol 17 g packet Commonly  known as: MIRALAX / GLYCOLAX Take 17 g by mouth daily.   sildenafil 20 MG tablet Commonly known as: Revatio Take 1 to 5 tablets 1 hour prior to intercourse as needed   tamsulosin 0.4 MG  Caps capsule Commonly known as: FLOMAX 1 tab by mouth with evening meal.   vitamin B-12 50 MCG tablet Commonly known as: CYANOCOBALAMIN Take 50 mcg by mouth. 1 a day        Allergies:  Allergies  Allergen Reactions   Shellfish Allergy Swelling    Family History: Family History  Problem Relation Age of Onset   Hypertension Mother     Social History:  reports that he has been smoking cigarettes. He has a 9.3 pack-year smoking history. He has been exposed to tobacco smoke. He has never used smokeless tobacco. He reports current alcohol use. He reports that he does not use drugs.   Physical Exam: BP 122/83   Pulse 88   Constitutional:  Alert and oriented, No acute distress. HEENT: [place] AT, moist mucus membranes.  Trachea midline, no masses. Neurologic: Grossly intact, no focal deficits, moving all 4 extremities. Abd: No scars or hernias. Psychiatric: Normal mood and affect.    Assessment & Plan:    1. Unfavorable intermediate risk prostate cancer - The patient was counseled about the natural history of prostate cancer and the standard treatment options that are available for prostate cancer. It was explained to him how his age and life expectancy, clinical stage, Gleason score, and PSA affect his prognosis, the decision to proceed with additional staging studies, as well as how that information influences recommended treatment strategies. We discussed the roles for active surveillance, radiation therapy, surgical therapy, androgen deprivation, as well as ablative therapy options for the treatment of prostate cancer as appropriate to his individual cancer situation. We discussed the risks and benefits of these options with regard to their impact on cancer control and also in terms of potential  adverse events, complications, and impact on quality of life particularly related to urinary, bowel, and sexual function. The patient was encouraged to ask questions throughout the discussion today and all questions were answered to his stated satisfaction. In addition, the patient was provided with and/or directed to appropriate resources and literature for further education about prostate cancer treatment options.  We discussed surgical therapy for prostate cancer including the different available surgical approaches.  Specifically, we discussed robotic prostatectomy with pelvic lymph node dissection based on his restratification.  We discussed, in detail, the risks and expectations of surgery with regard to cancer control, urinary control, and erectile dysfunction as well as expected post operative recovery processed. Additional risks of surgery including but not limited to bleeding, infection, hernia formation, nerve damage, fistula formation, bowel/rectal injury, potentially necessitating colostomy, damage to the urinary tract resulting in urinary leakage, urethral stricture, and cardiopulmonary risk such as myocardial infarction, stroke, death, thromboembolism etc. were explained.   - Recommend further staging with PSMA PET scan, given that it is unfavorable in nature  - Right now, it is imperative if he wants to consider surgery that he get his diabetes under better control. Target HbA1c closer to 7.5 before considering surgery.  - Coordinate with primary care physician to improve diabetes management  - Radiation therapy is also an option, but he has expressed a preference against it due to concerns about side effects.  Declined consult.  - Referral for pelvic floor exercises to improve post-surgical urinary control  2. Erectile dysfunction - Baseline condition, likely to worsen post-prostatectomy. - Discuss potential impact of surgery on erectile function.  3. Urinary symptoms - Continue  Flomax and finasteride- will dc after proctectomy   Return for PSMA PET scan for staging.  I have  reviewed the above documentation for accuracy and completeness, and I agree with the above.   Vanna Scotland, MD    [facility] [address], [address] [place], [place] [address] [phone]  I spent 52 total minutes on the day of the encounter including pre-visit review of the medical record, face-to-face time with the patient, and post visit ordering of labs/imaging/tests.

## 2023-04-22 ENCOUNTER — Other Ambulatory Visit: Payer: Self-pay

## 2023-04-22 DIAGNOSIS — C61 Malignant neoplasm of prostate: Secondary | ICD-10-CM

## 2023-04-22 NOTE — Progress Notes (Signed)
Surgical Physician Order Form [facility]  Dr. Vanna Scotland, MD  * Scheduling expectation : Next Available  *Length of Case:   *Clearance needed: yes;; PCP for glucose control.  Prefer A1c to be less than 8 if possible.    *Anticoagulation Instructions: Hold all anticoagulants  *Aspirin Instructions: Hold Aspirin  *Post-op visit Date/Instructions:  Foley removal 7 days, MD 6 weeks with PSA  *Diagnosis: Prostate Cancer  *Procedure: bilateral PLND, Robotic laparoscopic Prostatectomy (95621)   Additional orders: N/A  -Admit type: Observation  -Anesthesia: General  -VTE Prophylaxis Standing Order SCD's       Other:   -Standing Lab Orders Per Anesthesia    Lab other: UA&Urine Culture, CBC, BMP, INR, T&S  -Standing Test orders EKG/Chest x-ray per Anesthesia       Test other:   - Medications:  Ancef 2gm IV  -Other orders:  N/A

## 2023-04-26 ENCOUNTER — Encounter: Payer: Self-pay | Admitting: Internal Medicine

## 2023-04-27 ENCOUNTER — Ambulatory Visit
Admission: RE | Admit: 2023-04-27 | Discharge: 2023-04-27 | Disposition: A | Discharge status: Home / Self Care | Payer: Medicaid Other | Source: Ambulatory Visit | Attending: Urology | Admitting: Urology

## 2023-04-27 DIAGNOSIS — C61 Malignant neoplasm of prostate: Secondary | ICD-10-CM | POA: Insufficient documentation

## 2023-04-27 MED ORDER — FLOTUFOLASTAT F 18 GALLIUM 296-5846 MBQ/ML IV SOLN
8.4500 | Freq: Once | INTRAVENOUS | Status: AC
  Administered 2023-04-27: 8.45 via INTRAVENOUS
  Filled 2023-04-27: qty 9

## 2023-04-29 ENCOUNTER — Telehealth: Payer: Self-pay

## 2023-04-29 ENCOUNTER — Ambulatory Visit: Payer: Medicaid Other | Attending: Urology | Admitting: Physical Therapy

## 2023-04-29 DIAGNOSIS — M533 Sacrococcygeal disorders, not elsewhere classified: Secondary | ICD-10-CM | POA: Insufficient documentation

## 2023-04-29 DIAGNOSIS — R2689 Other abnormalities of gait and mobility: Secondary | ICD-10-CM | POA: Diagnosis present

## 2023-04-29 DIAGNOSIS — C61 Malignant neoplasm of prostate: Secondary | ICD-10-CM | POA: Diagnosis not present

## 2023-04-29 DIAGNOSIS — R279 Unspecified lack of coordination: Secondary | ICD-10-CM | POA: Insufficient documentation

## 2023-04-29 DIAGNOSIS — M6289 Other specified disorders of muscle: Secondary | ICD-10-CM | POA: Diagnosis not present

## 2023-04-29 NOTE — Telephone Encounter (Signed)
Per Dr. Apolinar Junes, Patient is to be scheduled for Robotic Laparoscopic Radical Prostatectomy with Bilateral Pelvic Lymph Node Dissection   Mr. Ryan Hensley was contacted and possible surgical dates were discussed, Monday November 18th, 2024 was agreed upon for surgery.    Patient was directed to call [phone] between 1-3pm the day before surgery to find out surgical arrival time.  Instructions were given not to eat or drink from midnight on the night before surgery and have a driver for the day of surgery. On the surgery day patient was instructed to enter through the [facility] entrance of [facility] report the Same Day Surgery desk.   Pre-Admit Testing will be in contact via phone to set up an interview with the anesthesia team to review your history and medications prior to surgery.   Reminder of this information was sent via MAILED to the patient.

## 2023-04-29 NOTE — Patient Instructions (Signed)

## 2023-04-29 NOTE — Therapy (Signed)
OUTPATIENT PHYSICAL THERAPY EVALUATION   Patient Name: Ryan Hensley MRN: [id number] DOB:01-26-1971, 52 y.o., male Today's Date: 04/29/2023   PT End of Session - 04/29/23 0835     Visit Number 1    Number of Visits 10    Date for PT Re-Evaluation 07/08/23    PT Start Time 0811    PT Stop Time 0850    PT Time Calculation (min) 39 min    Activity Tolerance Patient tolerated treatment well;No increased pain    Behavior During Therapy WFL for tasks assessed/performed             Past Medical History:  Diagnosis Date   Diabetes mellitus without complication (HCC)    GERD (gastroesophageal reflux disease)    Hyperlipidemia    Left eye injury, sequela 1978   Past Surgical History:  Procedure Laterality Date   boxer fracture ORIF Right 2016   EYE SURGERY  2007   Strabismus surgery, left eye.  Age 55 yo.   Patient Active Problem List   Diagnosis Date Noted   Tobacco abuse disorder 01/28/2023   Exertional dyspnea 01/28/2023   Elevated PSA 10/30/2022   Benign prostatic hyperplasia (BPH) with straining on urination 10/30/2022   Lumbar back pain with radiculopathy affecting right lower extremity 02/25/2022   Food insecurity 02/25/2022   Cutaneous abscess of back (any part, except buttock) 12/19/2018   Gastroesophageal reflux disease 12/13/2018   Mixed hyperlipidemia 12/13/2018   Diabetic peripheral neuropathy (HCC) 12/13/2018   Diabetic retinopathy associated with type 2 diabetes mellitus (HCC) 12/13/2018   Type 2 diabetes mellitus with complication, without long-term current use of insulin (HCC) 12/13/2018   Left eye injury, sequela 1978    PCP: Delrae Alfred  REFERRING PROVIDER: Lurlean Horns DIAG: Prostate Cancer  Rationale for Evaluation and Treatment Rehabilitation  THERAPY DIAG:  Sacrococcygeal disorders, not elsewhere classified  Other abnormalities of gait and mobility  Unspecified lack of coordination  ONSET DATE:  pending RALP 07/05/23 for prostate  cancer   SUBJECTIVE:                                                                                                                                                                                           SUBJECTIVE STATEMENT: 1) difficulty with completely emptying urine with need to strain and push to finish urinating 2)  CLBP:  pt has to lean forward when sitting to decrease back pain 3) R shoulder pain: will capture more info next session   PERTINENT HISTORY:    PAIN:  Are you having pain? Yes: see above ( VAS values to capture at next session)  PRECAUTIONS:   WEIGHT BEARING RESTRICTIONS: No  FALLS:  Has patient fallen in last 6 months? No  LIVING ENVIRONMENT: Lives with:  Lives in:  Stairs:  Has following equipment at home:   OCCUPATION:   PLOF: IND  PATIENT GOALS:     OBJECTIVE:    [facility] PT Assessment - 04/29/23 0836       Posture/Postural Control   Posture Comments slumped posture, ankles crossed under chair,  without back support chair, pt leans forward to relieve back pain      Strength   Overall Strength Comments R hip flexion 3/5, L 4/5      Palpation   SI assessment  R shoulder and iliac crest lowered    Palpation comment tightness and hypomobile T/L junction      Ambulation/Gait   Gait Comments 1.41m/s ( limited posterior rotation of pelvis on RLE and thorax ,  ( post Tx: 1.26 m/s, reciporcal gait , cued for more arm swings)             [facility] Adult PT Treatment/Exercise - 04/29/23 0836       Therapeutic Activites    Therapeutic Activities Other Therapeutic Activities    Other Therapeutic Activities explained pre-prostatectomy HEP , explained anatomy/ physiology, cocreated goals, explained the role of posture for less back pain and realignment of pelvis /spine      Neuro Re-ed    Neuro Re-ed Details  cued for body mechanics to minimzie straining back and pelvic floor      Manual Therapy   Manual therapy comments STM/MWM at thoracic  hypomobilty , paraspinals               HOME EXERCISE PROGRAM: See pt instruction section    ASSESSMENT:  CLINICAL IMPRESSION:  Pt is a  52  yo  who presents with difficulty with completely emptying urine with need to strain and push to finish urinating, CLBP, R shoulder pain  which impact QOL, ADL, fitness, and community activities. Pt is due for RALP 07/05/23 and was referred to Pelvic PT for prehab outcomes.   Pt's musculoskeletal assessment revealed uneven pelvic girdle and shoulder height, asymmetries to gait pattern, limited spinal /pelvic mobility, dyscoordination and strength of pelvic floor mm, hip weakness, poor body mechanics which places strain on the abdominal/pelvic floor mm. These are deficits that indicate an ineffective intraabdominal pressure system associated with increased risk for pt's Sx.    Pt will benefit from coordination training and education on fitness and functional positions in order to gain a more effective intraabdominal pressure system to minimize urinary leakage post prostatectomy .  Pt was provided education on etiology of Sx with anatomy, physiology explanation with images along with the benefits of customized pelvic PT Tx based on pt's medical conditions and musculoskeletal deficits.  Explained the physiology of deep core mm coordination and roles of pelvic floor function in urination, defecation, sexual function, and postural control with deep core mm system.   Regional interdependent approaches will yield greater benefits in pt's POC  Following Tx today which pt tolerated without complaints,  pt demo'd proper body mechanics to minimize straining pelvic floor with sit to stand and sitting posture. Pt also demo'd more levelled pelvis and shoulder with improved gait mechanics post Tx.  Plan to address realignment of spine/ pelvis at next session to help optimize IAP system for improved pelvic floor function and decrease LBP.  Plan to address pelvic  floor issues once pelvis and spine are realigned to  yield better outcomes.   Pt benefits from skilled PT.    OBJECTIVE IMPAIRMENTS decreased activity tolerance, decreased coordination, decreased endurance, decreased mobility, difficulty walking, decreased ROM, decreased strength, decreased safety awareness, hypomobility, increased muscle spasms, impaired flexibility, improper body mechanics, postural dysfunction, and pain ACTIVITY LIMITATIONS  self-care,  sleep, home chores, work tasks    PARTICIPATION LIMITATIONS:  community, gym activities    PERSONAL FACTORS        are also affecting patient's functional outcome.    REHAB POTENTIAL: Good   CLINICAL DECISION MAKING: Evolving/moderate complexity   EVALUATION COMPLEXITY: Moderate    PATIENT EDUCATION:    Education details: Showed pt anatomy images. Explained muscles attachments/ connection, physiology of deep core system/ spinal- thoracic-pelvis-lower kinetic chain as they relate to pt's presentation, Sx, and past Hx. Explained what and how these areas of deficits need to be restored to balance and function    See Therapeutic activity / neuromuscular re-education section  Answered pt's questions.   Person educated: Patient Education method: Explanation, Demonstration, Tactile cues, Verbal cues, and Handouts Education comprehension: verbalized understanding, returned demonstration, verbal cues required, tactile cues required, and needs further education     PLAN: PT FREQUENCY: 1x/week   PT DURATION: 10 weeks   PLANNED INTERVENTIONS: Therapeutic exercises, Therapeutic activity, Neuromuscular re-education, Balance training, Gait training, Patient/Family education, Self Care, Joint mobilization, Spinal mobilization, Moist heat, Taping, and Manual therapy, dry needling.   PLAN FOR NEXT SESSION: See clinical impression for plan     GOALS: Goals reviewed with patient? Yes  SHORT TERM GOALS: Target date: 05/27/2023    Pt  will demo IND with HEP                    Baseline: Not IND            Goal status: INITIAL   LONG TERM GOALS: Target date: 07/08/2023   1.Pt will demo proper deep core coordination without chest breathing and optimal excursion of diaphragm/pelvic floor in order to promote spinal stability and pelvic floor function  Baseline: dyscoordination Goal status: INITIAL  2.  Pt will demo > 5 pt change on FOTO  to improve QOL and function  PFDI Urinary baseline -  Lower score = better function  Urinary Problem baseline-   Higher score = better function  Pelvic Pain baseline - Lower score = better function  Bowel  constipation baseline -   Higher score = better function  PFDI Bowel - Higher score = better function  Lumber baseline  -  Higher score = better function   Goal status: INITIAL  ( to capture at next session)   3.  Pt will demo proper body mechanics in against gravity tasks and ADLs  work tasks, fitness  to minimize straining pelvic floor / back    Baseline: not IND, improper form that places strain on pelvic floor  Goal status: INITIAL    4. Pt will demo increased gait speed > 1.3 m/s with reciprocal gait pattern, longer stride length  in order to ambulate safely in community and return to fitness routine  Baseline: 1.71m/s ( limited posterior rotation of pelvis on RLE and thorax ,  ( 04/28/24:  post Tx: 1.26 m/s, reciporcal gait , cued for more arm swings)  Goal status: INITIAL    5. Pt will demo levelled pelvic girdle and shoulder height in order to progress to deep core strengthening HEP and restore mobility at spine, pelvis, gait, posture minimize falls, and  improve balance   Baseline: R shoulder and iliac crest lowered  Goal status: INITIAL   6. Pt will demo upright sitting posture without leaning forward in order to restore upright posture with no more LBP  Baseline: leans forward to minimize LBP Goal status: INITIAL  7. Pt will report decreased straining  and no need to push to complete urination with proper deep core coordination to minimize pelvic floor dysfunction Baseline: straining / pushing  Goal Status: INITIAL   04/29/2023, 4:02 PM

## 2023-04-29 NOTE — Progress Notes (Signed)
  Phone Number: [phone] for Surgical Coordinator Fax Number: [phone]  REQUEST FOR SURGICAL CLEARANCE       Date: Date: 04/29/2023  Faxed to: Dr. Delrae Alfred, MD  Surgeon: Dr. Vanna Scotland, MD     Date of Surgery: 07/05/2023  Operation: Robotic Laparoscopic Radical Prostatectomy with Bilateral Pelvic Lymph Node Dissection   Anesthesia Type: General   Diagnosis: Prostate Cancer   Patient Requires:   Medical Clearance : Yes  Reason: Would like for patient to be seen for glucose control, prefer patient to have an A1C below 8.    Risk Assessment:    Low   []       Moderate   []     High   []           This patient is optimized for surgery  YES []       NO   []    I recommend further assessment/workup prior to surgery. YES []      NO  []   Appointment scheduled for: _______________________   Further recommendations: ____________________________________     Physician Signature:__________________________________   Printed Name: ________________________________________   Date: _________________

## 2023-04-29 NOTE — Progress Notes (Signed)
   [facility] Posting Form  Surgery Date: Date: 07/05/2023  Surgeon: Dr. Vanna Scotland, MD  Inpt ( No  )   Outpt (Yes)   Obs ( No  )   Diagnosis: C61 Prostate Cancer  -CPT: [phone], [phone]  Surgery: Robotic Laparoscopic Radical Prostatectomy with Bilateral Pelvic Lymph Node Dissection  Stop Anticoagulations: Yes and also hold ASA  Cardiac/Medical/Pulmonary Clearance needed: yes, PCP for Glucose control. Would prefer A1C to be less than 8  Clearance needed from Dr: Delrae Alfred  Clearance request sent on: Date: 04/29/23  *Orders entered into EPIC  Date: 04/29/23   *Case booked in EPIC  Date: 04/26/2023  *Notified pt of Surgery: Date: 04/26/2023  PRE-OP UA & CX: yes, and will also obtain, CBC, BMP, INR, Type and Screen  *Placed into Prior Authorization Work [place] Date: 04/29/23  Assistant/laser/rep:No

## 2023-05-05 ENCOUNTER — Ambulatory Visit: Payer: Medicaid Other | Admitting: Physical Therapy

## 2023-05-05 DIAGNOSIS — M533 Sacrococcygeal disorders, not elsewhere classified: Secondary | ICD-10-CM

## 2023-05-05 DIAGNOSIS — R279 Unspecified lack of coordination: Secondary | ICD-10-CM

## 2023-05-05 DIAGNOSIS — R2689 Other abnormalities of gait and mobility: Secondary | ICD-10-CM

## 2023-05-05 NOTE — Therapy (Signed)
OUTPATIENT PHYSICAL THERAPY TREATMENT    Patient Name: Murdoch Heinle MRN: [id number] DOB:14-Jun-1971, 52 y.o., male Today's Date: 05/05/2023    PT End of Session - 05/05/23 0842     Visit Number 2    Number of Visits 10    Date for PT Re-Evaluation 07/08/23    PT Start Time 0805    PT Stop Time 0850    PT Time Calculation (min) 45 min    Activity Tolerance Patient tolerated treatment well;No increased pain    Behavior During Therapy WFL for tasks assessed/performed              Past Medical History:  Diagnosis Date   Diabetes mellitus without complication (HCC)    GERD (gastroesophageal reflux disease)    Hyperlipidemia    Left eye injury, sequela 1978   Past Surgical History:  Procedure Laterality Date   boxer fracture ORIF Right 2016   EYE SURGERY  2007   Strabismus surgery, left eye.  Age 58 yo.   Patient Active Problem List   Diagnosis Date Noted   Tobacco abuse disorder 01/28/2023   Exertional dyspnea 01/28/2023   Elevated PSA 10/30/2022   Benign prostatic hyperplasia (BPH) with straining on urination 10/30/2022   Lumbar back pain with radiculopathy affecting right lower extremity 02/25/2022   Food insecurity 02/25/2022   Cutaneous abscess of back (any part, except buttock) 12/19/2018   Gastroesophageal reflux disease 12/13/2018   Mixed hyperlipidemia 12/13/2018   Diabetic peripheral neuropathy (HCC) 12/13/2018   Diabetic retinopathy associated with type 2 diabetes mellitus (HCC) 12/13/2018   Type 2 diabetes mellitus with complication, without long-term current use of insulin (HCC) 12/13/2018   Left eye injury, sequela 1978    PCP: Delrae Alfred  REFERRING PROVIDER: Lurlean Horns DIAG: Prostate Cancer  Rationale for Evaluation and Treatment Rehabilitation  THERAPY DIAG:  Sacrococcygeal disorders, not elsewhere classified   Other abnormalities of gait and mobility   Unspecified lack of coordination  ONSET DATE:  pending RALP 07/05/23 for  prostate cancer   SUBJECTIVE:                                                                                                                                                                                          SUBJECTIVE STATEMENT TODAY   Pt has practicing good sitting posture   SUBJECTIVE STATEMENT AT EVAL 04/29/23  : 1) difficulty with completely emptying urine with need to strain and push to finish urinating 2)  CLBP:  pt has to lean forward when sitting to decrease back pain 3) R shoulder pain: will capture more info next session   PERTINENT HISTORY:  Back pain,   PAIN:  Are you having pain? Yes: see above ( VAS values to capture at next session)   PRECAUTIONS:   WEIGHT BEARING RESTRICTIONS: No  FALLS:  Has patient fallen in last 6 months? No  LIVING ENVIRONMENT: Lives with: with brother  Lives in: house -1 story   Stairs: 12 STE with rail   OCCUPATION: not working , in the past pt did [employer]   PLOF: IND  PATIENT GOALS:  Get better    OBJECTIVE:    [facility] PT Assessment - 05/05/23 0812       Palpation   SI assessment  R shouler lowered, iliacc rest levelled,   deviated to L at C/T junction . ( post Tx: more levelled shoulders but will still need more manual on C/T L area)    Palpation comment hypomopbile, deviated C/T junction, tenderness and tightness ato upper trap, SCM, scalenes, medial scapula L             [facility] Adult PT Treatment/Exercise - 05/05/23 1002       Therapeutic Activites    Other Therapeutic Activities cued for log rolling to minimize straining neck and back      Neuro Re-ed    Neuro Re-ed Details  cued for HEP to realign spine      Modalities   Modalities Moist Heat      Moist Heat Therapy   Number Minutes Moist Heat 5 Minutes    Moist Heat Location --   neck/ thoracic, ( unbilled)     Manual Therapy   Manual therapy comments STM/MWM at thoracic hypomobilty/ C/T junction L                HOME EXERCISE  PROGRAM: See pt instruction section    ASSESSMENT:  CLINICAL IMPRESSION:  Following Tx today which pt tolerated without complaints,  pt demo'd more levelled shoulders with less C/T/ thoracic mm tightness / deviations.   Cued pt for HEP.   Plan to continue  addressing realignment of spine/ pelvis at next session to help optimize IAP system for improved pelvic floor function and decrease LBP.  Plan to address pelvic floor issues once pelvis and spine are realigned to yield better outcomes.   Pt benefits from skilled PT.    OBJECTIVE IMPAIRMENTS decreased activity tolerance, decreased coordination, decreased endurance, decreased mobility, difficulty walking, decreased ROM, decreased strength, decreased safety awareness, hypomobility, increased muscle spasms, impaired flexibility, improper body mechanics, postural dysfunction, and pain ACTIVITY LIMITATIONS  self-care,  sleep, home chores, work tasks    PARTICIPATION LIMITATIONS:  community, gym activities    PERSONAL FACTORS        are also affecting patient's functional outcome.    REHAB POTENTIAL: Good   CLINICAL DECISION MAKING: Evolving/moderate complexity   EVALUATION COMPLEXITY: Moderate    PATIENT EDUCATION:    Education details: Showed pt anatomy images. Explained muscles attachments/ connection, physiology of deep core system/ spinal- thoracic-pelvis-lower kinetic chain as they relate to pt's presentation, Sx, and past Hx. Explained what and how these areas of deficits need to be restored to balance and function    See Therapeutic activity / neuromuscular re-education section  Answered pt's questions.   Person educated: Patient Education method: Explanation, Demonstration, Tactile cues, Verbal cues, and Handouts Education comprehension: verbalized understanding, returned demonstration, verbal cues required, tactile cues required, and needs further education     PLAN: PT FREQUENCY: 1x/week   PT DURATION: 10 weeks    PLANNED INTERVENTIONS: Therapeutic exercises,  Therapeutic activity, Neuromuscular re-education, Balance training, Gait training, Patient/Family education, Self Care, Joint mobilization, Spinal mobilization, Moist heat, Taping, and Manual therapy, dry needling.   PLAN FOR NEXT SESSION: See clinical impression for plan     GOALS: Goals reviewed with patient? Yes  SHORT TERM GOALS: Target date: 05/27/2023    Pt will demo IND with HEP                    Baseline: Not IND            Goal status: INITIAL   LONG TERM GOALS: Target date: 07/08/2023   1.Pt will demo proper deep core coordination without chest breathing and optimal excursion of diaphragm/pelvic floor in order to promote spinal stability and pelvic floor function  Baseline: dyscoordination Goal status: INITIAL  2.  Pt will demo > 5 pt change on FOTO  to improve QOL and function  PFDI Urinary baseline -  Lower score = better function  Urinary Problem baseline-   Higher score = better function  Pelvic Pain baseline - Lower score = better function  Bowel  constipation baseline -   Higher score = better function  PFDI Bowel - Higher score = better function  Lumber baseline  -  Higher score = better function   Goal status: INITIAL  ( to capture at next session)   3.  Pt will demo proper body mechanics in against gravity tasks and ADLs  work tasks, fitness  to minimize straining pelvic floor / back    Baseline: not IND, improper form that places strain on pelvic floor  Goal status: INITIAL    4. Pt will demo increased gait speed > 1.3 m/s with reciprocal gait pattern, longer stride length  in order to ambulate safely in community and return to fitness routine  Baseline: 1.10m/s ( limited posterior rotation of pelvis on RLE and thorax ,  ( 04/28/24:  post Tx: 1.26 m/s, reciporcal gait , cued for more arm swings)  Goal status: INITIAL    5. Pt will demo levelled pelvic girdle and shoulder height in order to  progress to deep core strengthening HEP and restore mobility at spine, pelvis, gait, posture minimize falls, and improve balance   Baseline: R shoulder and iliac crest lowered  Goal status: INITIAL   6. Pt will demo upright sitting posture without leaning forward in order to restore upright posture with no more LBP  Baseline: leans forward to minimize LBP Goal status: INITIAL  7. Pt will report decreased straining and no need to push to complete urination with proper deep core coordination to minimize pelvic floor dysfunction Baseline: straining / pushing  Goal Status: INITIAL   05/05/2023, 8:10 AM

## 2023-05-05 NOTE — Patient Instructions (Signed)
  Lengthen Back rib by L  shoulder ( winging)    Lie on R  side , pillow between knees and under head  Pull  L arm overhead over mattress, grab the edge of mattress,pull it upward, drawing elbow away from ears  Breathing 10 reps  Brushing arm with 3/4 turn onto pillow behind back  Lying on R  side ,Pillow/ Block between knees     dragging top forearm across ribs below breast rotating 3/4 turn,  rotating  _L_ only this week ,  relax onto the pillow behind the back  and then back to other palm , maintain top palm on body whole top and not lift shoulder   Only the L this week , later, will do both sides once you are levelled  __  Angel wings, dragging arms and wrist on bed  10 reps

## 2023-05-11 ENCOUNTER — Other Ambulatory Visit: Payer: Self-pay

## 2023-05-11 DIAGNOSIS — E118 Type 2 diabetes mellitus with unspecified complications: Secondary | ICD-10-CM

## 2023-05-12 ENCOUNTER — Ambulatory Visit: Payer: Medicaid Other | Admitting: Physical Therapy

## 2023-05-12 DIAGNOSIS — M533 Sacrococcygeal disorders, not elsewhere classified: Secondary | ICD-10-CM | POA: Diagnosis not present

## 2023-05-12 DIAGNOSIS — R2689 Other abnormalities of gait and mobility: Secondary | ICD-10-CM

## 2023-05-12 DIAGNOSIS — R279 Unspecified lack of coordination: Secondary | ICD-10-CM

## 2023-05-12 NOTE — Patient Instructions (Signed)
Deep core level 1-2 ( handout)

## 2023-05-12 NOTE — Therapy (Addendum)
OUTPATIENT PHYSICAL THERAPY TREATMENT    Patient Name: Ryan Hensley MRN: [id number] DOB:[birth year], 52 y.o., male Today's Date: 05/12/2023   PT End of Session - 05/12/23 1415     Visit Number 3    Number of Visits 10    Date for PT Re-Evaluation 07/08/23    PT Start Time 1345    PT Stop Time 1418    PT Time Calculation (min) 33 min    Activity Tolerance Patient tolerated treatment well;No increased pain    Behavior During Therapy [facility] for tasks assessed/performed             PT End of Session - 05/12/23 1415     Visit Number 3    Number of Visits 10    Date for PT Re-Evaluation 07/08/23    PT Start Time 1345    PT Stop Time 1418    PT Time Calculation (min) 33 min    Activity Tolerance Patient tolerated treatment well;No increased pain    Behavior During Therapy WFL for tasks assessed/performed              Past Medical History:  Diagnosis Date   Diabetes mellitus without complication (HCC)    GERD (gastroesophageal reflux disease)    Hyperlipidemia    Left eye injury, sequela 1978   Past Surgical History:  Procedure Laterality Date   boxer fracture ORIF Right 2016   EYE SURGERY  2007   Strabismus surgery, left eye.  Age 89 yo.   Patient Active Problem List   Diagnosis Date Noted   Tobacco abuse disorder 01/28/2023   Exertional dyspnea 01/28/2023   Elevated PSA 10/30/2022   Benign prostatic hyperplasia (BPH) with straining on urination 10/30/2022   Lumbar back pain with radiculopathy affecting right lower extremity 02/25/2022   Food insecurity 02/25/2022   Cutaneous abscess of back (any part, except buttock) 12/19/2018   Gastroesophageal reflux disease 12/13/2018   Mixed hyperlipidemia 12/13/2018   Diabetic peripheral neuropathy (HCC) 12/13/2018   Diabetic retinopathy associated with type 2 diabetes mellitus (HCC) 12/13/2018   Type 2 diabetes mellitus with complication, without long-term current use of insulin (HCC) 12/13/2018   Left eye injury,  sequela 1978    PCP: Delrae Alfred  REFERRING PROVIDER: Lurlean Horns DIAG: Prostate Cancer  Rationale for Evaluation and Treatment Rehabilitation  THERAPY DIAG:  Sacrococcygeal disorders, not elsewhere classified   Other abnormalities of gait and mobility   Unspecified lack of coordination  ONSET DATE:  pending RALP 07/05/23 for prostate cancer   SUBJECTIVE:  SUBJECTIVE STATEMENT TODAY   Pt reports neck pain is 5/10   SUBJECTIVE STATEMENT AT EVAL 04/29/23  : 1) difficulty with completely emptying urine with need to strain and push to finish urinating 2)  CLBP:  pt has to lean forward when sitting to decrease back pain 3) R shoulder pain: will capture more info next session   PERTINENT HISTORY:   Back pain,   PAIN:  Are you having pain? Yes: see above ( VAS values to capture at next session)   PRECAUTIONS:   WEIGHT BEARING RESTRICTIONS: No  FALLS:  Has patient fallen in last 6 months? No  LIVING ENVIRONMENT: Lives with: with brother  Lives in: house -1 story   Stairs: 12 STE with rail   OCCUPATION: not working , in the past pt did [employer]   PLOF: IND  PATIENT GOALS:  Get better    OBJECTIVE:    [facility] PT Assessment - 05/12/23 1416       Palpation   Palpation comment deviated/ hypomobile C5-6 L,  interspinsals / occiput tight,  R medial scapula mm attachments tightness             [facility] Adult PT Treatment/Exercise - 05/12/23 1416       Neuro Re-ed    Neuro Re-ed Details  cued for deep core level 1-2      Modalities   Modalities Moist Heat      Moist Heat Therapy   Number Minutes Moist Heat 5 Minutes    Moist Heat Location --   neck/ thoracic, ( during instruction for deep core)     Manual Therapy   Manual therapy comments STM/MWM at areas noted in  assesment to improve mobilize and decrease pain                HOME EXERCISE PROGRAM: See pt instruction section    ASSESSMENT:  CLINICAL IMPRESSION:  Following Tx today which pt tolerated without complaints,  pt demo'd improved C/T/ thoracic mm mobility. Pt reported neck pain from 5/10 to 1/10.   Cued pt for deep core level 1-2 which pt demo'd correctly after training.   Plan to continue  addressing realignment of spine/ pelvis at next session to help optimize IAP system for improved pelvic floor function and decrease LBP.  Plan to address pelvic floor issues once  deep core practice is in place.   Pt benefits from skilled PT.    OBJECTIVE IMPAIRMENTS decreased activity tolerance, decreased coordination, decreased endurance, decreased mobility, difficulty walking, decreased ROM, decreased strength, decreased safety awareness, hypomobility, increased muscle spasms, impaired flexibility, improper body mechanics, postural dysfunction, and pain ACTIVITY LIMITATIONS  self-care,  sleep, home chores, work tasks    PARTICIPATION LIMITATIONS:  community, gym activities    PERSONAL FACTORS        are also affecting patient's functional outcome.    REHAB POTENTIAL: Good   CLINICAL DECISION MAKING: Evolving/moderate complexity   EVALUATION COMPLEXITY: Moderate    PATIENT EDUCATION:    Education details: Showed pt anatomy images. Explained muscles attachments/ connection, physiology of deep core system/ spinal- thoracic-pelvis-lower kinetic chain as they relate to pt's presentation, Sx, and past Hx. Explained what and how these areas of deficits need to be restored to balance and function    See Therapeutic activity / neuromuscular re-education section  Answered pt's questions.   Person educated: Patient Education method: Explanation, Demonstration, Tactile cues, Verbal cues, and Handouts Education comprehension: verbalized understanding, returned demonstration, verbal cues  required, tactile cues  required, and needs further education     PLAN: PT FREQUENCY: 1x/week   PT DURATION: 10 weeks   PLANNED INTERVENTIONS: Therapeutic exercises, Therapeutic activity, Neuromuscular re-education, Balance training, Gait training, Patient/Family education, Self Care, Joint mobilization, Spinal mobilization, Moist heat, Taping, and Manual therapy, dry needling.   PLAN FOR NEXT SESSION: See clinical impression for plan     GOALS: Goals reviewed with patient? Yes  SHORT TERM GOALS: Target date: 05/27/2023    Pt will demo IND with HEP                    Baseline: Not IND            Goal status: INITIAL   LONG TERM GOALS: Target date: 07/08/2023   1.Pt will demo proper deep core coordination without chest breathing and optimal excursion of diaphragm/pelvic floor in order to promote spinal stability and pelvic floor function  Baseline: dyscoordination Goal status: INITIAL  2.  Pt will demo > 5 pt change on FOTO  to improve QOL and function  PFDI Urinary baseline -  Lower score = better function  Urinary Problem baseline-   Higher score = better function  Pelvic Pain baseline - Lower score = better function  Bowel  constipation baseline -   Higher score = better function  PFDI Bowel - Higher score = better function  Lumber baseline  -  Higher score = better function   Goal status: INITIAL  ( to capture at next session)   3.  Pt will demo proper body mechanics in against gravity tasks and ADLs  work tasks, fitness  to minimize straining pelvic floor / back    Baseline: not IND, improper form that places strain on pelvic floor  Goal status: INITIAL    4. Pt will demo increased gait speed > 1.3 m/s with reciprocal gait pattern, longer stride length  in order to ambulate safely in community and return to fitness routine  Baseline: 1.49m/s ( limited posterior rotation of pelvis on RLE and thorax ,  ( 04/28/24:  post Tx: 1.26 m/s, reciporcal gait ,  cued for more arm swings)  Goal status: INITIAL    5. Pt will demo levelled pelvic girdle and shoulder height in order to progress to deep core strengthening HEP and restore mobility at spine, pelvis, gait, posture minimize falls, and improve balance   Baseline: R shoulder and iliac crest lowered  Goal status: INITIAL   6. Pt will demo upright sitting posture without leaning forward in order to restore upright posture with no more LBP  Baseline: leans forward to minimize LBP Goal status: INITIAL  7. Pt will report decreased straining and no need to push to complete urination with proper deep core coordination to minimize pelvic floor dysfunction Baseline: straining / pushing  Goal Status: INITIAL   05/12/2023, 3:01 PM

## 2023-05-18 ENCOUNTER — Ambulatory Visit: Payer: Medicaid Other | Attending: Urology | Admitting: Physical Therapy

## 2023-05-18 DIAGNOSIS — R2689 Other abnormalities of gait and mobility: Secondary | ICD-10-CM | POA: Diagnosis present

## 2023-05-18 DIAGNOSIS — M533 Sacrococcygeal disorders, not elsewhere classified: Secondary | ICD-10-CM | POA: Diagnosis present

## 2023-05-18 DIAGNOSIS — R279 Unspecified lack of coordination: Secondary | ICD-10-CM | POA: Insufficient documentation

## 2023-05-18 NOTE — Patient Instructions (Addendum)
   Laying on your back  Morning and midday and night    On your back, slight anterior tilt of pelvis, feet firm, knees bent, Feet hip width apart    Mon Tue Wed Thurs Fri  Sat Sun           quick  Pelvic floor squeeze  5 reps           Deep core level 1 ( 10 breaths)   quick  Pelvic floor squeeze  5 reps            Deep core level 2 ( 6 min)   quick  Pelvic floor squeeze  5 reps            __________

## 2023-05-18 NOTE — Therapy (Signed)
OUTPATIENT PHYSICAL THERAPY TREATMENT    Patient Name: Ryan Hensley MRN: [id number] DOB:June 26, 1971, 52 y.o., male Today's Date: 05/18/2023   PT End of Session - 05/18/23 1400     Visit Number 4    Number of Visits 10    Date for PT Re-Evaluation 07/08/23    PT Start Time 1350    PT Stop Time 1428    PT Time Calculation (min) 38 min    Activity Tolerance Patient tolerated treatment well;No increased pain    Behavior During Therapy [facility] for tasks assessed/performed             PT End of Session - 05/18/23 1400     Visit Number 4    Number of Visits 10    Date for PT Re-Evaluation 07/08/23    PT Start Time 1350    PT Stop Time 1428    PT Time Calculation (min) 38 min    Activity Tolerance Patient tolerated treatment well;No increased pain    Behavior During Therapy WFL for tasks assessed/performed              Past Medical History:  Diagnosis Date   Diabetes mellitus without complication (HCC)    GERD (gastroesophageal reflux disease)    Hyperlipidemia    Left eye injury, sequela 1978   Past Surgical History:  Procedure Laterality Date   boxer fracture ORIF Right 2016   EYE SURGERY  2007   Strabismus surgery, left eye.  Age 75 yo.   Patient Active Problem List   Diagnosis Date Noted   Tobacco abuse disorder 01/28/2023   Exertional dyspnea 01/28/2023   Elevated PSA 10/30/2022   Benign prostatic hyperplasia (BPH) with straining on urination 10/30/2022   Lumbar back pain with radiculopathy affecting right lower extremity 02/25/2022   Food insecurity 02/25/2022   Cutaneous abscess of back (any part, except buttock) 12/19/2018   Gastroesophageal reflux disease 12/13/2018   Mixed hyperlipidemia 12/13/2018   Diabetic peripheral neuropathy (HCC) 12/13/2018   Diabetic retinopathy associated with type 2 diabetes mellitus (HCC) 12/13/2018   Type 2 diabetes mellitus with complication, without long-term current use of insulin (HCC) 12/13/2018   Left eye injury,  sequela 1978    PCP: Delrae Alfred  REFERRING PROVIDER: Lurlean Horns DIAG: Prostate Cancer  Rationale for Evaluation and Treatment Rehabilitation  THERAPY DIAG:  Sacrococcygeal disorders, not elsewhere classified   Other abnormalities of gait and mobility   Unspecified lack of coordination  ONSET DATE:  pending RALP 07/05/23 for prostate cancer   SUBJECTIVE:  SUBJECTIVE STATEMENT TODAY   Pt reports neck pain is 5/10   SUBJECTIVE STATEMENT AT EVAL 04/29/23  : 1) difficulty with completely emptying urine with need to strain and push to finish urinating 2)  CLBP:  pt has to lean forward when sitting to decrease back pain 3) R shoulder pain: will capture more info next session   PERTINENT HISTORY:   Back pain,   PAIN:  Are you having pain? Yes: see above ( VAS values to capture at next session)   PRECAUTIONS:   WEIGHT BEARING RESTRICTIONS: No  FALLS:  Has patient fallen in last 6 months? No  LIVING ENVIRONMENT: Lives with: with brother  Lives in: house -1 story   Stairs: 12 STE with rail   OCCUPATION: not working , in the past pt did [employer]   PLOF: IND  PATIENT GOALS:  Get better    OBJECTIVE:    [facility] PT Assessment - 05/18/23 1453       Posture/Postural Control   Posture Comments upright posture standing, levelled shoulder/ pelvis , still posterilr tilt with seated posture             [facility] Adult PT Treatment/Exercise - 05/12/23 1416       Neuro Re-ed    Neuro Re-ed Details  cued for deep core level 1-2      Modalities   Modalities Moist Heat      Moist Heat Therapy   Number Minutes Moist Heat 5 Minutes    Moist Heat Location --   neck/ thoracic, ( during instruction for deep core)     Manual Therapy   Manual therapy comments STM/MWM at areas noted in  assesment to improve mobilize and decrease pain                HOME EXERCISE PROGRAM: See pt instruction section    ASSESSMENT:  CLINICAL IMPRESSION: Pt is showing levelled pelvis and shoulders and more upright posture from past sessions and compliance to HEP. Following Tx today which pt tolerated without complaints,  pt demo'd 5 reps  proper quick contractions of pelvic floor without ab / glut overuse.    Plan to progress to endurance contractions at next session in hooklying and advance quick contractions to seated position.  Pt benefits from skilled PT.    OBJECTIVE IMPAIRMENTS decreased activity tolerance, decreased coordination, decreased endurance, decreased mobility, difficulty walking, decreased ROM, decreased strength, decreased safety awareness, hypomobility, increased muscle spasms, impaired flexibility, improper body mechanics, postural dysfunction, and pain ACTIVITY LIMITATIONS  self-care,  sleep, home chores, work tasks    PARTICIPATION LIMITATIONS:  community, gym activities    PERSONAL FACTORS        are also affecting patient's functional outcome.    REHAB POTENTIAL: Good   CLINICAL DECISION MAKING: Evolving/moderate complexity   EVALUATION COMPLEXITY: Moderate    PATIENT EDUCATION:    Education details: Showed pt anatomy images. Explained muscles attachments/ connection, physiology of deep core system/ spinal- thoracic-pelvis-lower kinetic chain as they relate to pt's presentation, Sx, and past Hx. Explained what and how these areas of deficits need to be restored to balance and function    See Therapeutic activity / neuromuscular re-education section  Answered pt's questions.   Person educated: Patient Education method: Explanation, Demonstration, Tactile cues, Verbal cues, and Handouts Education comprehension: verbalized understanding, returned demonstration, verbal cues required, tactile cues required, and needs further education     PLAN: PT  FREQUENCY: 1x/week   PT DURATION: 10 weeks  PLANNED INTERVENTIONS: Therapeutic exercises, Therapeutic activity, Neuromuscular re-education, Balance training, Gait training, Patient/Family education, Self Care, Joint mobilization, Spinal mobilization, Moist heat, Taping, and Manual therapy, dry needling.   PLAN FOR NEXT SESSION: See clinical impression for plan     GOALS: Goals reviewed with patient? Yes  SHORT TERM GOALS: Target date: 05/27/2023    Pt will demo IND with HEP                    Baseline: Not IND            Goal status: INITIAL   LONG TERM GOALS: Target date: 07/08/2023   1.Pt will demo proper deep core coordination without chest breathing and optimal excursion of diaphragm/pelvic floor in order to promote spinal stability and pelvic floor function  Baseline: dyscoordination Goal status: INITIAL  2.  Pt will demo > 5 pt change on FOTO  to improve QOL and function  PFDI Urinary baseline -  Lower score = better function  Urinary Problem baseline-   Higher score = better function  Pelvic Pain baseline - Lower score = better function  Bowel  constipation baseline -   Higher score = better function  PFDI Bowel - Higher score = better function  Lumber baseline  -  Higher score = better function   Goal status: INITIAL  ( to capture at next session)   3.  Pt will demo proper body mechanics in against gravity tasks and ADLs  work tasks, fitness  to minimize straining pelvic floor / back    Baseline: not IND, improper form that places strain on pelvic floor  Goal status: INITIAL    4. Pt will demo increased gait speed > 1.3 m/s with reciprocal gait pattern, longer stride length  in order to ambulate safely in community and return to fitness routine  Baseline: 1.53m/s ( limited posterior rotation of pelvis on RLE and thorax ,  ( 04/28/24:  post Tx: 1.26 m/s, reciporcal gait , cued for more arm swings)  Goal status: INITIAL    5. Pt will demo levelled  pelvic girdle and shoulder height in order to progress to deep core strengthening HEP and restore mobility at spine, pelvis, gait, posture minimize falls, and improve balance   Baseline: R shoulder and iliac crest lowered  Goal status: INITIAL   6. Pt will demo upright sitting posture without leaning forward in order to restore upright posture with no more LBP  Baseline: leans forward to minimize LBP Goal status: INITIAL  7. Pt will report decreased straining and no need to push to complete urination with proper deep core coordination to minimize pelvic floor dysfunction Baseline: straining / pushing  Goal Status: INITIAL   05/18/2023, 2:55 PM

## 2023-05-24 ENCOUNTER — Ambulatory Visit: Payer: Medicaid Other | Admitting: Physical Therapy

## 2023-05-24 DIAGNOSIS — R279 Unspecified lack of coordination: Secondary | ICD-10-CM

## 2023-05-24 DIAGNOSIS — M533 Sacrococcygeal disorders, not elsewhere classified: Secondary | ICD-10-CM

## 2023-05-24 DIAGNOSIS — R2689 Other abnormalities of gait and mobility: Secondary | ICD-10-CM

## 2023-05-24 NOTE — Therapy (Signed)
OUTPATIENT PHYSICAL THERAPY TREATMENT    Patient Name: Ryan Hensley MRN: [id number] DOB:1971/02/24, 52 y.o., male Today's Date: 05/24/2023   PT End of Session - 05/24/23 0912     Visit Number 5    Number of Visits 10    Date for PT Re-Evaluation 07/08/23    PT Start Time 0910    PT Stop Time 0940    PT Time Calculation (min) 30 min    Activity Tolerance Patient tolerated treatment well;No increased pain    Behavior During Therapy [facility] for tasks assessed/performed             PT End of Session - 05/24/23 0912     Visit Number 5    Number of Visits 10    Date for PT Re-Evaluation 07/08/23    PT Start Time 0910    PT Stop Time 0940    PT Time Calculation (min) 30 min    Activity Tolerance Patient tolerated treatment well;No increased pain    Behavior During Therapy WFL for tasks assessed/performed              Past Medical History:  Diagnosis Date   Diabetes mellitus without complication (HCC)    GERD (gastroesophageal reflux disease)    Hyperlipidemia    Left eye injury, sequela 1978   Past Surgical History:  Procedure Laterality Date   boxer fracture ORIF Right 2016   EYE SURGERY  2007   Strabismus surgery, left eye.  Age 65 yo.   Patient Active Problem List   Diagnosis Date Noted   Tobacco abuse disorder 01/28/2023   Exertional dyspnea 01/28/2023   Elevated PSA 10/30/2022   Benign prostatic hyperplasia (BPH) with straining on urination 10/30/2022   Lumbar back pain with radiculopathy affecting right lower extremity 02/25/2022   Food insecurity 02/25/2022   Cutaneous abscess of back (any part, except buttock) 12/19/2018   Gastroesophageal reflux disease 12/13/2018   Mixed hyperlipidemia 12/13/2018   Diabetic peripheral neuropathy (HCC) 12/13/2018   Diabetic retinopathy associated with type 2 diabetes mellitus (HCC) 12/13/2018   Type 2 diabetes mellitus with complication, without long-term current use of insulin (HCC) 12/13/2018   Left eye injury,  sequela 1978    PCP: Delrae Alfred  REFERRING PROVIDER: Lurlean Horns DIAG: Prostate Cancer  Rationale for Evaluation and Treatment Rehabilitation  THERAPY DIAG:  Sacrococcygeal disorders, not elsewhere classified   Other abnormalities of gait and mobility   Unspecified lack of coordination  ONSET DATE:  pending RALP 07/05/23 for prostate cancer   SUBJECTIVE:  SUBJECTIVE STATEMENT TODAY   Pt reports neck pain is 5/10   SUBJECTIVE STATEMENT AT EVAL 04/29/23  : 1) difficulty with completely emptying urine with need to strain and push to finish urinating 2)  CLBP:  pt has to lean forward when sitting to decrease back pain 3) R shoulder pain: will capture more info next session   PERTINENT HISTORY:   Back pain,   PAIN:  Are you having pain? Yes: see above ( VAS values to capture at next session)   PRECAUTIONS:   WEIGHT BEARING RESTRICTIONS: No  FALLS:  Has patient fallen in last 6 months? No  LIVING ENVIRONMENT: Lives with: with brother  Lives in: house -1 story   Stairs: 12 STE with rail   OCCUPATION: not working , in the past pt did [employer]   PLOF: IND  PATIENT GOALS:  Get better    OBJECTIVE:    [facility] PT Assessment - 05/24/23 1208       Coordination   Coordination and Movement Description oblique mm overuse with deep core , poor carry over with rest breaks between kegels      Palpation   SI assessment  levelled shoulders and pelvis             Pelvic Floor Special Questions - 05/24/23 1217     External Perineal Exam through clothing, 3 sec, 4 reps in hooklying, seated 5 reps with cue for less posterior contraction and use of BUE for stability / anterior tilt of pelvis             [facility] Adult PT Treatment/Exercise - 05/24/23 1224       Therapeutic  Activites    Other Therapeutic Activities Explained the quick contraction in seated position, long holds in hooklying, provided a chart      Neuro Re-ed    Neuro Re-ed Details  cued for vocalizing the long holds to ensure activation of pelvic floor                  HOME EXERCISE PROGRAM: See pt instruction section    ASSESSMENT:  CLINICAL IMPRESSION: Progressed to endurance pelvic floor contractions today.  Following Tx today which pt tolerated without complaints, pt achieved 3 sec, 4 reps in hooklying, cued for vocalizing the long holds to ensure activation of pelvic floor. Pt showed seated 5 reps with cue for less posterior contraction and use of BUE for stability / anterior tilt of pelvis .    Plan to reassess contractions at next session and d/c.    Pt was 15 min late, hence, abbreviated session. Pt benefits from skilled PT.    OBJECTIVE IMPAIRMENTS decreased activity tolerance, decreased coordination, decreased endurance, decreased mobility, difficulty walking, decreased ROM, decreased strength, decreased safety awareness, hypomobility, increased muscle spasms, impaired flexibility, improper body mechanics, postural dysfunction, and pain ACTIVITY LIMITATIONS  self-care,  sleep, home chores, work tasks    PARTICIPATION LIMITATIONS:  community, gym activities    PERSONAL FACTORS        are also affecting patient's functional outcome.    REHAB POTENTIAL: Good   CLINICAL DECISION MAKING: Evolving/moderate complexity   EVALUATION COMPLEXITY: Moderate    PATIENT EDUCATION:    Education details: Showed pt anatomy images. Explained muscles attachments/ connection, physiology of deep core system/ spinal- thoracic-pelvis-lower kinetic chain as they relate to pt's presentation, Sx, and past Hx. Explained what and how these areas of deficits need to be restored to balance and function    See Therapeutic  activity / neuromuscular re-education section  Answered pt's  questions.   Person educated: Patient Education method: Explanation, Demonstration, Tactile cues, Verbal cues, and Handouts Education comprehension: verbalized understanding, returned demonstration, verbal cues required, tactile cues required, and needs further education     PLAN: PT FREQUENCY: 1x/week   PT DURATION: 10 weeks   PLANNED INTERVENTIONS: Therapeutic exercises, Therapeutic activity, Neuromuscular re-education, Balance training, Gait training, Patient/Family education, Self Care, Joint mobilization, Spinal mobilization, Moist heat, Taping, and Manual therapy, dry needling.   PLAN FOR NEXT SESSION: See clinical impression for plan     GOALS: Goals reviewed with patient? Yes  SHORT TERM GOALS: Target date: 05/27/2023    Pt will demo IND with HEP                    Baseline: Not IND            Goal status: INITIAL   LONG TERM GOALS: Target date: 07/08/2023   1.Pt will demo proper deep core coordination without chest breathing and optimal excursion of diaphragm/pelvic floor in order to promote spinal stability and pelvic floor function  Baseline: dyscoordination Goal status: INITIAL  2.  Pt will demo > 5 pt change on FOTO  to improve QOL and function  PFDI Urinary baseline -  Lower score = better function  Urinary Problem baseline-   Higher score = better function  Pelvic Pain baseline - Lower score = better function  Bowel  constipation baseline -   Higher score = better function  PFDI Bowel - Higher score = better function  Lumber baseline  -  Higher score = better function   Goal status: INITIAL  ( to capture at next session)   3.  Pt will demo proper body mechanics in against gravity tasks and ADLs  work tasks, fitness  to minimize straining pelvic floor / back    Baseline: not IND, improper form that places strain on pelvic floor  Goal status: INITIAL    4. Pt will demo increased gait speed > 1.3 m/s with reciprocal gait pattern, longer  stride length  in order to ambulate safely in community and return to fitness routine  Baseline: 1.83m/s ( limited posterior rotation of pelvis on RLE and thorax ,  ( 04/28/24:  post Tx: 1.26 m/s, reciporcal gait , cued for more arm swings)  Goal status: INITIAL    5. Pt will demo levelled pelvic girdle and shoulder height in order to progress to deep core strengthening HEP and restore mobility at spine, pelvis, gait, posture minimize falls, and improve balance   Baseline: R shoulder and iliac crest lowered  Goal status: INITIAL   6. Pt will demo upright sitting posture without leaning forward in order to restore upright posture with no more LBP  Baseline: leans forward to minimize LBP Goal status: INITIAL  7. Pt will report decreased straining and no need to push to complete urination with proper deep core coordination to minimize pelvic floor dysfunction Baseline: straining / pushing  Goal Status: INITIAL   05/24/2023, 12:26 PM

## 2023-05-24 NOTE — Patient Instructions (Signed)
Before surgery and after  Laying on your back  Morning and night   Mon Tue Wed Thurs Fri  Sat Sun  Stretches           Pelvic floor squeeze long holds on your back  3 sec, 4 reps           Deep core level 1 +   Pelvic floor squeeze long holds on your back  3 sec, 4 reps   Deep core level 2  ( knee out)  )           Pelvic floor squeeze long holds on your back  3 sec, 4 reps             _________________________________________________________________________________  Breakfast , lunch, dinner  Seated   Mon Tue Wed Thurs Fri  Sat Sun           quick  Pelvic floor squeeze 5 reps            Mid morning -10 am  Midafternoon 2pm  quick  Pelvic floor squeeze 5 reps            ________________________________________________________________________________  DO not do squeezes when wearing catheter

## 2023-05-25 ENCOUNTER — Encounter (HOSPITAL_COMMUNITY): Payer: Self-pay

## 2023-05-25 ENCOUNTER — Ambulatory Visit (HOSPITAL_COMMUNITY)
Admission: EM | Admit: 2023-05-25 | Discharge: 2023-05-25 | Disposition: A | Discharge status: Home / Self Care | Payer: Medicaid Other | Attending: Internal Medicine | Admitting: Internal Medicine

## 2023-05-25 DIAGNOSIS — S46211A Strain of muscle, fascia and tendon of other parts of biceps, right arm, initial encounter: Secondary | ICD-10-CM | POA: Diagnosis not present

## 2023-05-25 DIAGNOSIS — S56911A Strain of unspecified muscles, fascia and tendons at forearm level, right arm, initial encounter: Secondary | ICD-10-CM

## 2023-05-25 MED ORDER — DICLOFENAC SODIUM 75 MG PO TBEC
75.0000 mg | DELAYED_RELEASE_TABLET | Freq: Two times a day (BID) | ORAL | 0 refills | Status: AC
Start: 2023-05-25 — End: 2023-06-04

## 2023-05-25 NOTE — ED Triage Notes (Signed)
Pt states he thinks he pulled a muscle in his right arm while bowling last week.

## 2023-05-25 NOTE — Discharge Instructions (Addendum)
No bowling until symptoms fully resolved.  Avoid heavy lifting. And heat application as discussed, take the diclofenac twice daily with food for pain and inflammation

## 2023-05-25 NOTE — ED Provider Notes (Signed)
[facility]    CSN: [id number] Arrival date & time: 05/25/23  1724      History   Chief Complaint Chief Complaint  Patient presents with   Arm Injury    HPI Ryan Hensley is a 52 y.o. male.    Arm Injury Associated symptoms: no neck pain   Right upper extremity pain onset 8 days ago while bowling.  Pain is just before and just after the elbow worse with any flexion or heavy lifting.  Denies swelling, bruising, paresthesias.  Denies direct trauma.  No over-the-counter remedies tried  Past Medical History:  Diagnosis Date   Diabetes mellitus without complication (HCC)    GERD (gastroesophageal reflux disease)    Hyperlipidemia    Left eye injury, sequela 1978    Patient Active Problem List   Diagnosis Date Noted   Tobacco abuse disorder 01/28/2023   Exertional dyspnea 01/28/2023   Elevated PSA 10/30/2022   Benign prostatic hyperplasia (BPH) with straining on urination 10/30/2022   Lumbar back pain with radiculopathy affecting right lower extremity 02/25/2022   Food insecurity 02/25/2022   Cutaneous abscess of back (any part, except buttock) 12/19/2018   Gastroesophageal reflux disease 12/13/2018   Mixed hyperlipidemia 12/13/2018   Diabetic peripheral neuropathy (HCC) 12/13/2018   Diabetic retinopathy associated with type 2 diabetes mellitus (HCC) 12/13/2018   Type 2 diabetes mellitus with complication, without long-term current use of insulin (HCC) 12/13/2018   Left eye injury, sequela 1978    Past Surgical History:  Procedure Laterality Date   boxer fracture ORIF Right 2016   EYE SURGERY  2007   Strabismus surgery, left eye.  Age 29 yo.       Home Medications    Prior to Admission medications   Medication Sig Start Date End Date Taking? Authorizing Provider  acetaminophen (TYLENOL) 500 MG tablet Take 500 mg by mouth every 6 (six) hours as needed. Takes 2 tablet once a day    [provider]  AgaMatrix Ultra-Thin Lancets MISC Check  blood glucose twice daily before meals 01/29/23   Julieanne Manson, MD  aspirin EC 81 MG tablet 1 tab by mouth daily with morning meal 12/13/18   Julieanne Manson, MD  atorvastatin (LIPITOR) 80 MG tablet TAKE 1 TABLET BY MOUTH WITH EVENING MEAL 01/22/23   Julieanne Manson, MD  Blood Glucose Monitoring Suppl (AGAMATRIX PRESTO) w/Device KIT Check blood sugars twice daily before meals 01/14/22   Julieanne Manson, MD  buPROPion [facility] SR) 150 MG 12 hr tablet 1 tab by mouth in morning daily for 3 days then increase to 1 tab by mouth twice daily on day 4 01/28/23   Julieanne Manson, MD  Continuous Glucose Sensor (DEXCOM G6 SENSOR) MISC Blood glucose check 3 times daily before meals and as needed. 01/29/23   Julieanne Manson, MD  Continuous Glucose Transmitter (DEXCOM G6 TRANSMITTER) MISC 1 Units by Does not apply route 3 (three) times daily before meals. 01/29/23   Julieanne Manson, MD  diclofenac (VOLTAREN) 75 MG EC tablet Take 1 tablet (75 mg total) by mouth 2 (two) times daily. 10/28/22   Tarry Kos, MD  empagliflozin (JARDIANCE) 10 MG TABS tablet Take 1 tablet (10 mg total) by mouth daily before breakfast. 10/22/22   Julieanne Manson, MD  famotidine (PEPCID) 20 MG tablet 2 tabs by mouth at bedtime 01/14/22   Julieanne Manson, MD  finasteride (PROSCAR) 5 MG tablet Take 1 tablet (5 mg total) by mouth daily. 10/22/22   Julieanne Manson, MD  gabapentin (NEURONTIN) 100 MG capsule Continue 3 caps by mouth at bedtime and add another 1 cap in morning and increase by 1 cap every 3 days until taking 3 caps 3 times daily 10/22/22   Julieanne Manson, MD  glipiZIDE (GLUCOTROL) 5 MG tablet TAKE 1 TABLET BY MOUTH TWICE DAILY WITH MEALS 01/22/23   Julieanne Manson, MD  glucose blood (AGAMATRIX PRESTO TEST) test strip Check blood blood sugar twice daily before meals 01/14/22   Julieanne Manson, MD  ibuprofen (ADVIL) 400 MG tablet Take 1 tablet (400 mg total) by mouth every 8 (eight) hours as  needed for moderate pain. 10/19/22   Zadie Rhine, MD  insulin glargine (LANTUS SOLOSTAR) 100 UNIT/ML Solostar Pen INJECT 15 UNITS SUBCUTANEOUS IN THE MORNING BEFORE BREAKFAST 01/29/23   Julieanne Manson, MD  metFORMIN (GLUCOPHAGE) 1000 MG tablet TAKE 1 TABLET BY MOUTH TWICE DAILY WITH A MEAL 01/22/23   Julieanne Manson, MD  metoCLOPramide (REGLAN) 10 MG tablet Take 5 mg by mouth 3 (three) times daily. 12/16/22   [provider]  metoCLOPramide (REGLAN) 5 MG tablet 1 tab by mouth before meal 3 times daily. 01/29/23   Julieanne Manson, MD  polyethylene glycol (MIRALAX / GLYCOLAX) 17 g packet Take 17 g by mouth daily.    [provider]  sildenafil (REVATIO) 20 MG tablet Take 1 to 5 tablets 1 hour prior to intercourse as needed 03/19/23   Vanna Scotland, MD  tamsulosin [facility]) 0.4 MG CAPS capsule 1 tab by mouth with evening meal. 01/29/23   Julieanne Manson, MD  vitamin B-12 (CYANOCOBALAMIN) 50 MCG tablet Take 50 mcg by mouth. 1 a day    [provider]    Family History Family History  Problem Relation Age of Onset   Hypertension Mother     Social History Social History   Tobacco Use   Smoking status: Every Day    Current packs/day: 0.30    Average packs/day: 0.3 packs/day for 31.0 years (9.3 ttl pk-yrs)    Types: Cigarettes    Passive exposure: Current   Smokeless tobacco: Never  Vaping Use   Vaping status: Never Used  Substance Use Topics   Alcohol use: Yes    Comment: 3 mixed drinks per month   Drug use: No     Allergies   Shellfish allergy   Review of Systems Review of Systems  Musculoskeletal:  Positive for myalgias. Negative for arthralgias, joint swelling and neck pain.  Neurological:  Positive for weakness (due to pain). Negative for numbness.     Physical Exam Triage Vital Signs ED Triage Vitals  Encounter Vitals Group     BP 05/25/23 1734 (!) 144/92     Systolic BP Percentile --      Diastolic BP Percentile --      Pulse  Rate 05/25/23 1733 85     Resp 05/25/23 1733 16     Temp 05/25/23 1733 98.2 F (36.8 C)     Temp Source 05/25/23 1733 Oral     SpO2 05/25/23 1733 97 %     Weight --      Height --      Head Circumference --      Peak Flow --      Pain Score 05/25/23 1734 6     Pain Loc --      Pain Education --      Exclude from Growth Chart --    No data found.  Updated Vital Signs BP (!) 144/92 (BP  Location: Left Arm)   Pulse 85   Temp 98.2 F (36.8 C) (Oral)   Resp 16   SpO2 97%   Visual Acuity Right Eye Distance:   Left Eye Distance:   Bilateral Distance:    Right Eye Near:   Left Eye Near:    Bilateral Near:     Physical Exam Vitals and nursing note reviewed.  HENT:     Head: Normocephalic.  Cardiovascular:     Rate and Rhythm: Normal rate.  Pulmonary:     Effort: Pulmonary effort is normal. No respiratory distress.  Musculoskeletal:     Comments: Right wrist full range of motion no swelling or tenderness right elbow full range of motion no swelling or tenderness, patient has pain and tenderness along proximal forearm with flexion against resistance, and pain and tenderness along distal bicep, soft tissue deformity   Skin:    General: Skin is warm and dry.  Neurological:     Mental Status: He is alert.      UC Treatments / Results  Labs (all labs ordered are listed, but only abnormal results are displayed) Labs Reviewed - No data to display  EKG   Radiology No results found.  Procedures Procedures (including critical care time)  Medications Ordered in UC Medications - No data to display  Initial Impression / Assessment and Plan / UC Course  I have reviewed the triage vital signs and the nursing notes.  Pertinent labs & imaging results that were available during my care of the patient were reviewed by me and considered in my medical decision making (see chart for details).     Right upper extremity pain abrupt onset while bowling 8 days ago exacerbates  with certain movements and flexion against resistance.  No soft tissue defect however has soft tissue tenderness, recommend rest, ice, NSAID diclofenac as prescribed follow-up with PCP in 1 week no bowling until symptoms resolve Final Clinical Impressions(s) / UC Diagnoses   Final diagnoses:  None   Discharge Instructions   None    ED Prescriptions   None    PDMP not reviewed this encounter.   Meliton Rattan, [place] 05/25/23 1750

## 2023-05-29 ENCOUNTER — Encounter: Payer: Self-pay | Admitting: Internal Medicine

## 2023-05-31 ENCOUNTER — Other Ambulatory Visit: Payer: Medicaid Other

## 2023-05-31 DIAGNOSIS — E118 Type 2 diabetes mellitus with unspecified complications: Secondary | ICD-10-CM

## 2023-05-31 DIAGNOSIS — E782 Mixed hyperlipidemia: Secondary | ICD-10-CM

## 2023-06-01 ENCOUNTER — Ambulatory Visit: Payer: Medicaid Other | Admitting: Physical Therapy

## 2023-06-01 DIAGNOSIS — M533 Sacrococcygeal disorders, not elsewhere classified: Secondary | ICD-10-CM

## 2023-06-01 DIAGNOSIS — R279 Unspecified lack of coordination: Secondary | ICD-10-CM

## 2023-06-01 DIAGNOSIS — R2689 Other abnormalities of gait and mobility: Secondary | ICD-10-CM

## 2023-06-01 LAB — HEMOGLOBIN A1C
Est. average glucose Bld gHb Est-mCnc: 235 mg/dL
Hgb A1c MFr Bld: 9.8 % — ABNORMAL HIGH (ref 4.8–5.6)

## 2023-06-01 LAB — LIPID PANEL W/O CHOL/HDL RATIO
Cholesterol, Total: 155 mg/dL (ref 100–199)
HDL: 39 mg/dL — ABNORMAL LOW (ref >39)
LDL Chol Calc (NIH): 97 mg/dL (ref 0–99)
Triglycerides: 103 mg/dL (ref 0–149)
VLDL Cholesterol Cal: 19 mg/dL (ref 5–40)

## 2023-06-01 NOTE — Therapy (Signed)
OUTPATIENT PHYSICAL THERAPY TREATMENT /  Discharge Summary after 6 visits    Patient Name: Ryan Hensley MRN: [id number] DOB:09/23/1970, 52 y.o., male Today's Date: 06/01/2023   PT End of Session - 06/01/23 0857     Visit Number 6    Number of Visits 10    Date for PT Re-Evaluation 07/08/23    PT Start Time 0850    PT Stop Time 0930    PT Time Calculation (min) 40 min    Activity Tolerance Patient tolerated treatment well;No increased pain    Behavior During Therapy [facility] for tasks assessed/performed             PT End of Session - 06/01/23 0857     Visit Number 6    Number of Visits 10    Date for PT Re-Evaluation 07/08/23    PT Start Time 0850    PT Stop Time 0930    PT Time Calculation (min) 40 min    Activity Tolerance Patient tolerated treatment well;No increased pain    Behavior During Therapy WFL for tasks assessed/performed              Past Medical History:  Diagnosis Date   Diabetes mellitus without complication (HCC)    GERD (gastroesophageal reflux disease)    Hyperlipidemia    Left eye injury, sequela 1978   Prostate cancer (HCC) 02/2023   Past Surgical History:  Procedure Laterality Date   boxer fracture ORIF Right 2016   EYE SURGERY  2007   Strabismus surgery, left eye.  Age 5 yo.   Patient Active Problem List   Diagnosis Date Noted   Prostate cancer (HCC) 02/2023   Tobacco abuse disorder 01/28/2023   Exertional dyspnea 01/28/2023   Elevated PSA 10/30/2022   Benign prostatic hyperplasia (BPH) with straining on urination 10/30/2022   Lumbar back pain with radiculopathy affecting right lower extremity 02/25/2022   Food insecurity 02/25/2022   Cutaneous abscess of back (any part, except buttock) 12/19/2018   Gastroesophageal reflux disease 12/13/2018   Mixed hyperlipidemia 12/13/2018   Diabetic peripheral neuropathy (HCC) 12/13/2018   Diabetic retinopathy associated with type 2 diabetes mellitus (HCC) 12/13/2018   Type 2 diabetes  mellitus with complication, without long-term current use of insulin (HCC) 12/13/2018   Left eye injury, sequela 1978    PCP: Delrae Alfred  REFERRING PROVIDER: Lurlean Horns DIAG: Prostate Cancer  Rationale for Evaluation and Treatment Rehabilitation  THERAPY DIAG:  Sacrococcygeal disorders, not elsewhere classified   Other abnormalities of gait and mobility   Unspecified lack of coordination  ONSET DATE:  pending RALP 07/05/23 for prostate cancer   SUBJECTIVE:  SUBJECTIVE STATEMENT TODAY   Pt reports R shoulder pain hurts. Pt has been keeping up with his kegel program   SUBJECTIVE STATEMENT AT EVAL 04/29/23  : 1) difficulty with completely emptying urine with need to strain and push to finish urinating 2)  CLBP:  pt has to lean forward when sitting to decrease back pain 3) R shoulder pain: will capture more info next session   PERTINENT HISTORY:   Back pain,   PAIN:  Are you having pain? Yes: see above ( VAS values to capture at next session)   PRECAUTIONS:   WEIGHT BEARING RESTRICTIONS: No  FALLS:  Has patient fallen in last 6 months? No  LIVING ENVIRONMENT: Lives with: with brother  Lives in: house -1 story   Stairs: 12 STE with rail   OCCUPATION: not working , in the past pt did [employer] representative   PLOF: IND  PATIENT GOALS:  Get better    OBJECTIVE:     [facility] PT Assessment - 06/01/23 0952       Palpation   SI assessment  levelled shoulders and pelvis, upright posture      Bed Mobility   Bed Mobility --   good carryover with bed mobility            Pelvic Floor Special Questions - 06/01/23 0951     External Perineal Exam through clothing, 3 sec, 10reps in hooklying, cues for breathing through nose not thru the mouth, seated cued for use of BUE 5reps seated quick  contraction               [facility] Adult PT Treatment/Exercise - 06/01/23 1610       Neuro Re-ed    Neuro Re-ed Details  cued for seated kegels to use BUE support fot more upright posture, cued for breathing thru nose not mouth with deep core and pelvic               HOME EXERCISE PROGRAM: See pt instruction section    ASSESSMENT:  CLINICAL IMPRESSION: Pt has achieved 5/7 goals with 2 remaining goals deferred as they were no assessed today.   Pt has made improvements with levelled pelvic and spinal alignment with significantly improved upright posture. Pt demonstrated improved deep core/ pelvic coordination and strengthening. Pt was educated on proper body mechanics to minimize straining abdominopelvic areas which will help with minimize worsening of incontinence associated with post-op RALP.   Pt has been compliant with short and long contractions of isolated pelvic floor contraction program and required less cues for proper technique.   Pt was educated on continuation of pelvic floor exercises post op to yield better outcome with continence. Pt was educated to not perform pelvic floor exercises when wearing catheter and to resume once it is removed.   Pt is ready for d/c at this time.     OBJECTIVE IMPAIRMENTS decreased activity tolerance, decreased coordination, decreased endurance, decreased mobility, difficulty walking, decreased ROM, decreased strength, decreased safety awareness, hypomobility, increased muscle spasms, impaired flexibility, improper body mechanics, postural dysfunction, and pain ACTIVITY LIMITATIONS  self-care,  sleep, home chores, work tasks    PARTICIPATION LIMITATIONS:  community, gym activities    PERSONAL FACTORS        are also affecting patient's functional outcome.    REHAB POTENTIAL: Good   CLINICAL DECISION MAKING: Evolving/moderate complexity   EVALUATION COMPLEXITY: Moderate    PATIENT EDUCATION:    Education details: Showed pt  anatomy images. Explained muscles attachments/ connection, physiology of  deep core system/ spinal- thoracic-pelvis-lower kinetic chain as they relate to pt's presentation, Sx, and past Hx. Explained what and how these areas of deficits need to be restored to balance and function    See Therapeutic activity / neuromuscular re-education section  Answered pt's questions.   Person educated: Patient Education method: Explanation, Demonstration, Tactile cues, Verbal cues, and Handouts Education comprehension: verbalized understanding, returned demonstration, verbal cues required, tactile cues required, and needs further education     PLAN: PT FREQUENCY: 1x/week   PT DURATION: 10 weeks   PLANNED INTERVENTIONS: Therapeutic exercises, Therapeutic activity, Neuromuscular re-education, Balance training, Gait training, Patient/Family education, Self Care, Joint mobilization, Spinal mobilization, Moist heat, Taping, and Manual therapy, dry needling.   PLAN FOR NEXT SESSION: See clinical impression for plan     GOALS: Goals reviewed with patient? Yes  SHORT TERM GOALS: Target date: 05/27/2023    Pt will demo IND with HEP                    Baseline: Not IND            Goal status:MET    LONG TERM GOALS: Target date: 07/08/2023   1.Pt will demo proper deep core coordination without chest breathing and optimal excursion of diaphragm/pelvic floor in order to promote spinal stability and pelvic floor function  Baseline: dyscoordination Goal status: MET   2.  Pt will demo > 5 pt change on FOTO  to improve QOL and function  PFDI Urinary baseline -  Lower score = better function  Urinary Problem baseline-   Higher score = better function  Pelvic Pain baseline - Lower score = better function  Bowel  constipation baseline -   Higher score = better function  PFDI Bowel - Higher score = better function  Lumber baseline  -  Higher score = better function   Goal status: Deferred (  to capture at next session)   3.  Pt will demo proper body mechanics in against gravity tasks and ADLs  work tasks, fitness  to minimize straining pelvic floor / back    Baseline: not IND, improper form that places strain on pelvic floor  Goal status: MET   4. Pt will demo increased gait speed > 1.3 m/s with reciprocal gait pattern, longer stride length  in order to ambulate safely in community and return to fitness routine  Baseline: 1.56m/s ( limited posterior rotation of pelvis on RLE and thorax ,  ( 04/29/23:  post Tx: 1.26 m/s, reciporcal gait , cued for more arm swings)  Goal status: MET     5. Pt will demo levelled pelvic girdle and shoulder height in order to progress to deep core strengthening HEP and restore mobility at spine, pelvis, gait, posture minimize falls, and improve balance   Baseline: R shoulder and iliac crest lowered  Goal status: MET    6. Pt will demo upright sitting posture without leaning forward in order to restore upright posture with no more LBP  Baseline: leans forward to minimize LBP Goal status: MET   7. Pt will report decreased straining and no need to push to complete urination with proper deep core coordination to minimize pelvic floor dysfunction Baseline: straining / pushing  Goal Status:  Deferred, did not assess   06/01/2023, 8:57 AM

## 2023-06-02 ENCOUNTER — Encounter: Payer: Self-pay | Admitting: Internal Medicine

## 2023-06-02 ENCOUNTER — Ambulatory Visit: Payer: Medicaid Other | Admitting: Internal Medicine

## 2023-06-02 VITALS — BP 114/70 | HR 90 | Resp 16 | Ht 70.0 in | Wt 173.0 lb

## 2023-06-02 DIAGNOSIS — Z0000 Encounter for general adult medical examination without abnormal findings: Secondary | ICD-10-CM | POA: Diagnosis not present

## 2023-06-02 DIAGNOSIS — R809 Proteinuria, unspecified: Secondary | ICD-10-CM | POA: Diagnosis not present

## 2023-06-02 DIAGNOSIS — Z23 Encounter for immunization: Secondary | ICD-10-CM

## 2023-06-02 DIAGNOSIS — E118 Type 2 diabetes mellitus with unspecified complications: Secondary | ICD-10-CM

## 2023-06-02 DIAGNOSIS — Z716 Tobacco abuse counseling: Secondary | ICD-10-CM

## 2023-06-02 DIAGNOSIS — Z720 Tobacco use: Secondary | ICD-10-CM

## 2023-06-02 DIAGNOSIS — B351 Tinea unguium: Secondary | ICD-10-CM

## 2023-06-02 DIAGNOSIS — E782 Mixed hyperlipidemia: Secondary | ICD-10-CM | POA: Diagnosis not present

## 2023-06-02 MED ORDER — OZEMPIC (0.25 OR 0.5 MG/DOSE) 2 MG/3ML ~~LOC~~ SOPN: 0.2500 mg | PEN_INJECTOR | SUBCUTANEOUS | 11 refills | Status: DC

## 2023-06-02 MED ORDER — NICOTINE POLACRILEX 2 MG MT GUM: 2.0000 mg | CHEWING_GUM | OROMUCOSAL | 6 refills | Status: DC | PRN

## 2023-06-02 NOTE — Progress Notes (Signed)
Subjective:    Patient ID: Ryan Hensley, male   DOB: 03/12/1971, 52 y.o.   MRN: [id number]   HPI  Here for Male CPE:  1.  STE:  Does not perform.  No family history of testicular cancer.   2.  PSA: Elevated at 5.9% with BPH symptoms and sent to Urology in spring.  Free PSA at 9.1% supporting increase probability of prostate cancer.  Underwent biopsy 04/13/2023 with 6 cores + for prostate CA.  NM PET scan without pick up of radiotracer outside of prostate gland.  Surgery planned for next month if A1C improved.    3.  Guaiac Cards/FIT:  Never returned.  4.  Colonoscopy:  Rectal tubular adenoma without high grade dysplasia with colonoscopy in 02/2023.  Dr. Tomasa [name].  Repeat planned for 2031 or 7 years.  No family history of colon cancer  5.  Cholesterol/Glucose:  LDL a bit high and HDL a bit low.  He is unable to increase physical activity until after his surgery, when he plans to join a gym.  A1C at 9.8%.  Up since starting Jardiance  6.  Immunizations: Needs COVID booster and 2nd Shingrix. Immunization History  Administered Date(s) Administered   Influenza-Unspecified 07/24/2022, 05/07/2023   Pfizer Fall 2023 Covid-19 Vaccine 6mos thru 20yrs  07/24/2022   Pneumococcal Polysaccharide-23 03/14/2019   Tdap 08/17/2014   Zoster Recombinant(Shingrix) 10/22/2022     Current Meds  Medication Sig   acetaminophen (TYLENOL) 500 MG tablet Take 500 mg by mouth every 6 (six) hours as needed. Takes 2 tablet once a day   AgaMatrix Ultra-Thin Lancets MISC Check blood glucose twice daily before meals   aspirin EC 81 MG tablet 1 tab by mouth daily with morning meal   atorvastatin (LIPITOR) 80 MG tablet TAKE 1 TABLET BY MOUTH WITH EVENING MEAL   Blood Glucose Monitoring Suppl (AGAMATRIX PRESTO) w/Device KIT Check blood sugars twice daily before meals   buPROPion (WELLBUTRIN SR) 150 MG 12 hr tablet 1 tab by mouth in morning daily for 3 days then increase to 1 tab by mouth twice daily on day 4    diclofenac (VOLTAREN) 75 MG EC tablet Take 1 tablet (75 mg total) by mouth 2 (two) times daily for 10 days.   empagliflozin (JARDIANCE) 10 MG TABS tablet Take 1 tablet (10 mg total) by mouth daily before breakfast.   famotidine (PEPCID) 20 MG tablet 2 tabs by mouth at bedtime   finasteride (PROSCAR) 5 MG tablet Take 1 tablet (5 mg total) by mouth daily.   gabapentin (NEURONTIN) 100 MG capsule Continue 3 caps by mouth at bedtime and add another 1 cap in morning and increase by 1 cap every 3 days until taking 3 caps 3 times daily   glipiZIDE (GLUCOTROL) 5 MG tablet TAKE 1 TABLET BY MOUTH TWICE DAILY WITH MEALS   glucose blood (AGAMATRIX PRESTO TEST) test strip Check blood blood sugar twice daily before meals   ibuprofen (ADVIL) 400 MG tablet Take 1 tablet (400 mg total) by mouth every 8 (eight) hours as needed for moderate pain.   insulin glargine (LANTUS SOLOSTAR) 100 UNIT/ML Solostar Pen INJECT 15 UNITS SUBCUTANEOUS IN THE MORNING BEFORE BREAKFAST   metFORMIN (GLUCOPHAGE) 1000 MG tablet TAKE 1 TABLET BY MOUTH TWICE DAILY WITH A MEAL   metoCLOPramide (REGLAN) 5 MG tablet 1 tab by mouth before meal 3 times daily.   sildenafil (REVATIO) 20 MG tablet Take 1 to 5 tablets 1 hour prior to intercourse as needed  tamsulosin (FLOMAX) 0.4 MG CAPS capsule 1 tab by mouth with evening meal.   vitamin B-12 (CYANOCOBALAMIN) 50 MCG tablet Take 50 mcg by mouth. 1 a day   Allergies  Allergen Reactions   Shellfish Allergy Swelling   Past Medical History:  Diagnosis Date   Diabetes mellitus without complication (HCC)    GERD (gastroesophageal reflux disease)    Hyperlipidemia    Left eye injury, sequela 1978   Prostate cancer (HCC) 02/2023   Past Surgical History:  Procedure Laterality Date   boxer fracture ORIF Right 2016   EYE SURGERY  2007   Strabismus surgery, left eye.  Age 55 yo.   Family History  Problem Relation Age of Onset   Hypertension Mother    Asthma Sister    Gout Brother    Kidney  Stones Brother    Social History   Socioeconomic History   Marital status: Single    Spouse name: Not on file   Number of children: 1   Years of education: 11   Highest education level: Not on file  Occupational History   Not on file  Tobacco Use   Smoking status: Every Day    Current packs/day: 0.25    Average packs/day: 0.3 packs/day for 36.9 years (9.2 ttl pk-yrs)    Types: Cigarettes    Start date: 09/14/1986    Passive exposure: Current   Smokeless tobacco: Never  Vaping Use   Vaping status: Never Used  Substance and Sexual Activity   Alcohol use: Yes    Comment: 3 mixed drinks per month   Drug use: No   Sexual activity: Not Currently  Other Topics Concern   Not on file  Social History Narrative   Lives with his brother.   Social Drivers of Health   Financial Resource Strain: Medium Risk (06/22/2023)   Overall Financial Resource Strain (CARDIA)    Difficulty of Paying Living Expenses: Somewhat hard  Food Insecurity: Food Insecurity Present (06/22/2023)   Hunger Vital Sign    Worried About Running Out of Food in the Last Year: Sometimes true    Ran Out of Food in the Last Year: Sometimes true  Transportation Needs: No Transportation Needs (06/22/2023)   PRAPARE - Administrator, Civil Service (Medical): No    Lack of Transportation (Non-Medical): No  Physical Activity: Not on file  Stress: Not on file  Social Connections: Unknown (12/30/2021)   Received from [facility]   Social Network    Social Network: Not on file  Intimate Partner Violence: Unknown (11/21/2021)   Received from [facility]   HITS    Physically Hurt: Not on file    Insult or Talk Down To: Not on file    Threaten Physical Harm: Not on file    Scream or Curse: Not on file     Review of Systems  HENT:  Positive for dental problem (Has dental clinic who are treating him.).   Eyes:  Positive for visual disturbance ([place]  Respiratory:  Negative for shortness of breath (only when goes up a flight of stairs).   Cardiovascular:  Negative for chest pain, palpitations and leg swelling.  Neurological:  Positive for numbness (Feet).      Objective:   BP 114/70 (BP Location: Left Arm, Patient Position: Sitting, Cuff Size: Normal)   Pulse 90   Resp 16   Ht 5\' 10"  (1.778 m)  Wt 173 lb (78.5 kg)   BMI 24.82 kg/m   Physical Exam Constitutional:      Appearance: He is normal weight.  HENT:     Head: Normocephalic and atraumatic.     Right Ear: Tympanic membrane, ear canal and external ear normal.     Left Ear: Tympanic membrane, ear canal and external ear normal.     Nose: Nose normal.     Mouth/Throat:     Mouth: Mucous membranes are moist.     Pharynx: Oropharynx is clear.  Eyes:     Extraocular Movements: Extraocular movements intact.     Conjunctiva/sclera: Conjunctivae normal.     Comments: Left cornea opaque with permanently dilated pupil Right pupil round and reactive to light. Discus appears sharp  Neck:     Thyroid: No thyroid mass or thyromegaly.  Cardiovascular:     Rate and Rhythm: Normal rate and regular rhythm.     Pulses:          Dorsalis pedis pulses are 2+ on the right side and 2+ on the left side.       Posterior tibial pulses are 2+ on the right side and 2+ on the left side.     Heart sounds: S1 normal and S2 normal. No murmur heard.    No friction rub. No S3 or S4 sounds.     Comments: No carotid bruits.  Carotid, radial, femoral, DP and PT pulses normal and equal.   Pulmonary:     Effort: Pulmonary effort is normal.     Breath sounds: Normal breath sounds and air entry.  Abdominal:     General: Abdomen is flat. Bowel sounds are normal.     Palpations: Abdomen is soft. There is no hepatomegaly, splenomegaly or mass.     Tenderness: There is no abdominal tenderness.     Hernia: No hernia is present.  Genitourinary:    Penis: Normal.      Testes:         Right: Mass not present. Right testis is descended.        Left: Mass not present. Left testis is descended.  Musculoskeletal:        General: Normal range of motion.     Cervical back: Normal range of motion and neck supple.     Right lower leg: No edema.     Left lower leg: No edema.  Feet:     Right foot:     Protective Sensation: 10 sites tested.  10 sites sensed.     Skin integrity: Skin integrity normal.     Toenail Condition: Right toenails are abnormally thick. Fungal disease present.    Left foot:     Protective Sensation: 10 sites tested.  10 sites sensed.     Skin integrity: Skin integrity normal.     Toenail Condition: Left toenails are abnormally thick. Fungal disease present. Lymphadenopathy:     Head:     Right side of head: No submental or submandibular adenopathy.     Left side of head: No submental or submandibular adenopathy.     Cervical: No cervical adenopathy.     Upper Body:     Right upper body: No supraclavicular adenopathy.     Left upper body: No supraclavicular adenopathy.     Lower Body: No right inguinal adenopathy. No left inguinal adenopathy.  Skin:    General: Skin is warm.     Capillary Refill: Capillary refill takes less than 2 seconds.  Findings: No lesion.  Neurological:     General: No focal deficit present.     Mental Status: He is alert and oriented to person, place, and time.     Cranial Nerves: Cranial nerves 2-12 are intact.     Sensory: Sensation is intact.     Motor: Motor function is intact.     Coordination: Coordination is intact.     Gait: Gait is intact.     Deep Tendon Reflexes: Reflexes are normal and symmetric.  Psychiatric:        Behavior: Behavior normal. Behavior is cooperative.      Assessment & Plan   CPE Shingrix #2/2 Encouraged obtaining COVID booster and pharmacy of choice.   2.  DM:  A1C much improved this year, but still not at goal for prevention of further diabetic complications at 9.8%.  Also, needs  to get down for surgery for prostate cancer.   Suspect he is missing meds occasionally as levels higher than last check He is amenable to adding Ozempic 0.25 mg weekly.  Continue current meds.  Wean glipizide if possible in future, then insulin. Urine microalbumin/crea following start of Jardiance. He is to send back who his eye specialist is now. Encouraged using a pill box for meds.  3.  Hyperlipidemia:  LDL up from June.  Again, suspect missing meds at times.  4.  Prostate Cancer:  needs surgery.  Attempting to get sugars under control  5.  Tobacco Use Disorder:  encouraged use of nicotine gum.  Went over how to use and preparation to decrease temptation.  He does not have a 20+ pack year history for lung cancer screen.  6.  Onychomycosis:  not treatment for now with all of his recent meds and changes.

## 2023-06-02 NOTE — Patient Instructions (Signed)
Drink a glass of water before every meal Drink 6-8 glasses of water daily Eat three meals daily Eat a protein and healthy fat with every meal (eggs,fish, chicken, [place] and limit red meats) Also, nuts, olives, avocado. Eat 5 servings of vegetables daily, mix the colors Eat 2 servings of fruit daily with skin, if skin is edible Use smaller plates Put food/utensils down as you chew and swallow each bite Eat at a table with friends/family at least once daily, no TV Do not eat in front of the TV  Recent studies show that people who consume all of their calories in a 12 hour period lose weight more efficiently.  For example, if you eat your first meal at 7:00 a.m., your last meal of the day should be completed by 7:00 p.m.

## 2023-06-03 ENCOUNTER — Other Ambulatory Visit: Payer: Medicaid Other

## 2023-06-03 DIAGNOSIS — E118 Type 2 diabetes mellitus with unspecified complications: Secondary | ICD-10-CM

## 2023-06-03 LAB — MICROALBUMIN / CREATININE URINE RATIO
Creatinine, Urine: 58 mg/dL
Microalb/Creat Ratio: 29 mg/g{creat} (ref 0–29)
Microalbumin, Urine: 16.6 ug/mL

## 2023-06-04 LAB — HEMOGLOBIN A1C
Est. average glucose Bld gHb Est-mCnc: 226 mg/dL
Hgb A1c MFr Bld: 9.5 % — ABNORMAL HIGH (ref 4.8–5.6)

## 2023-06-07 NOTE — Telephone Encounter (Signed)
[place]--he was supposed to get back to me with a name of office to get records and make sure the appropriate opthalmologist

## 2023-06-07 NOTE — Telephone Encounter (Signed)
States [place]

## 2023-06-15 ENCOUNTER — Other Ambulatory Visit: Payer: Self-pay | Admitting: Internal Medicine

## 2023-06-15 ENCOUNTER — Ambulatory Visit: Payer: Medicaid Other | Admitting: Internal Medicine

## 2023-06-16 ENCOUNTER — Telehealth: Payer: Self-pay | Admitting: Internal Medicine

## 2023-06-16 ENCOUNTER — Ambulatory Visit: Payer: Medicaid Other | Admitting: Physical Therapy

## 2023-06-16 NOTE — Telephone Encounter (Signed)
Patient is already scheduled in 2 weeks for bp check and was also asked to bring in sugars, I also added the weight check .

## 2023-06-21 ENCOUNTER — Ambulatory Visit: Payer: Medicaid Other | Admitting: Physical Therapy

## 2023-06-21 ENCOUNTER — Encounter: Payer: Self-pay | Admitting: Cardiovascular Disease

## 2023-06-21 ENCOUNTER — Ambulatory Visit: Payer: Medicaid Other | Attending: Cardiovascular Disease | Admitting: Cardiovascular Disease

## 2023-06-21 VITALS — BP 92/54 | HR 84 | Ht 70.0 in | Wt 181.0 lb

## 2023-06-21 DIAGNOSIS — E782 Mixed hyperlipidemia: Secondary | ICD-10-CM | POA: Diagnosis not present

## 2023-06-21 DIAGNOSIS — Z720 Tobacco use: Secondary | ICD-10-CM | POA: Diagnosis not present

## 2023-06-21 DIAGNOSIS — R0609 Other forms of dyspnea: Secondary | ICD-10-CM

## 2023-06-21 NOTE — Assessment & Plan Note (Signed)
Ryan Hensley was referred to me by Dr. Delrae Alfred because of dyspnea on exertion which she has had progressively over the last 4-5.  He denies chest pain.  He does have positive cardiac risk factors.  I am going to get a 2D echo and a coronary calcium score to further evaluate.

## 2023-06-21 NOTE — Patient Instructions (Signed)
  Testing/Procedures: Non-Cardiac CT scanning, (CAT scanning), is a noninvasive, special x-ray that produces cross-sectional images of the body using x-rays and a computer to evaluate calcium score.CT scans help physicians diagnose and treat medical conditions. For some CT exams, a contrast material is used to enhance visibility in the area of the body being studied. CT scans provide greater clarity and reveal more details than regular x-ray exams. Please schedule at [facility] on same day as echo.  Your physician has requested that you have an echocardiogram. Echocardiography is a painless test that uses sound waves to create images of your heart. It provides your doctor with information about the size and shape of your heart and how well your heart's chambers and valves are working. This procedure takes approximately one hour. There are no restrictions for this procedure. Please schedule at [facility] with CT scan. Please do NOT wear cologne, perfume, aftershave, or lotions (deodorant is allowed). Please arrive 15 minutes prior to your appointment time.  Please note: We ask at that you not bring children with you during ultrasound (echo/ vascular) testing. Due to room size and safety concerns, children are not allowed in the ultrasound rooms during exams. Our front office staff cannot provide observation of children in our lobby area while testing is being conducted. An adult accompanying a patient to their appointment will only be allowed in the ultrasound room at the discretion of the ultrasound technician under special circumstances. We apologize for any inconvenience.    Follow-Up: At [facility], you and your health needs are our priority.  As part of our continuing mission to provide you with exceptional heart care, we have created designated Provider Care Teams.  These Care Teams include your primary Cardiologist (physician) and Advanced Practice Providers (APPs -  Physician Assistants  and Nurse Practitioners) who all work together to provide you with the care you need, when you need it.  We recommend signing up for the patient portal called "MyChart".  Sign up information is provided on this After Visit Summary.  MyChart is used to connect with patients for Virtual Visits (Telemedicine).  Patients are able to view lab/test results, encounter notes, upcoming appointments, etc.  Non-urgent messages can be sent to your provider as well.   To learn more about what you can do with MyChart, go to [email or web address].    Your next appointment:    As needed.  Provider:   Nanetta Batty MD

## 2023-06-21 NOTE — Assessment & Plan Note (Signed)
Ongoing tobacco abuse of 1/2 pack/day with 25-pack-year smoking history recalcitrant to risk factor modification.

## 2023-06-21 NOTE — Progress Notes (Signed)
06/21/2023 Ryan Hensley   10/15/1970  [id number]  Primary Physician Julieanne Manson, MD Primary Cardiologist: Runell Gess MD Milagros Loll, [place], [place]  HPI:  Ryan Hensley is a 52 y.o. mildly overweight separated African-American male father of no children who currently does not work.  He was referred by Dr. Delrae Alfred for evaluation of dyspnea on exertion.  He moved down from [place] 7 or 8 years ago.  He was working as a Therapist, occupational  His risk factors include 20 pack years of tobacco abuse currently smoking 1/2 pack/day, treated hyperlipidemia and diabetes.  There is no family history of heart disease.  He has never had a heart attack or stroke.  He denies chest pain but has noticed increased dyspnea over the last 4 to 5 years.   Current Meds  Medication Sig   acetaminophen (TYLENOL) 500 MG tablet Take 500 mg by mouth every 6 (six) hours as needed. Takes 2 tablet once a day   AgaMatrix Ultra-Thin Lancets MISC Check blood glucose twice daily before meals   aspirin EC 81 MG tablet 1 tab by mouth daily with morning meal   atorvastatin (LIPITOR) 80 MG tablet TAKE 1 TABLET BY MOUTH WITH EVENING MEAL   Blood Glucose Monitoring Suppl (AGAMATRIX PRESTO) w/Device KIT Check blood sugars twice daily before meals   buPROPion (WELLBUTRIN SR) 150 MG 12 hr tablet 1 tab by mouth in morning daily for 3 days then increase to 1 tab by mouth twice daily on day 4   Continuous Glucose Sensor (DEXCOM G6 SENSOR) MISC Blood glucose check 3 times daily before meals and as needed.   Continuous Glucose Transmitter (DEXCOM G6 TRANSMITTER) MISC 1 Units by Does not apply route 3 (three) times daily before meals.   empagliflozin (JARDIANCE) 10 MG TABS tablet Take 1 tablet (10 mg total) by mouth daily before breakfast.   famotidine (PEPCID) 20 MG tablet TAKE 2 TABLETS BY MOUTH AT BEDTIME   finasteride (PROSCAR) 5 MG tablet Take 1 tablet (5 mg total) by mouth daily.   gabapentin (NEURONTIN) 100 MG capsule Continue  3 caps by mouth at bedtime and add another 1 cap in morning and increase by 1 cap every 3 days until taking 3 caps 3 times daily   glipiZIDE (GLUCOTROL) 5 MG tablet TAKE 1 TABLET BY MOUTH TWICE DAILY WITH MEALS   glucose blood (AGAMATRIX PRESTO TEST) test strip Check blood blood sugar twice daily before meals   ibuprofen (ADVIL) 400 MG tablet Take 1 tablet (400 mg total) by mouth every 8 (eight) hours as needed for moderate pain.   insulin glargine (LANTUS SOLOSTAR) 100 UNIT/ML Solostar Pen INJECT 15 UNITS SUBCUTANEOUS IN THE MORNING BEFORE BREAKFAST   metFORMIN (GLUCOPHAGE) 1000 MG tablet TAKE 1 TABLET BY MOUTH TWICE DAILY WITH A MEAL   metoCLOPramide (REGLAN) 5 MG tablet 1 tab by mouth before meal 3 times daily.   nicotine polacrilex (NICORETTE) 2 MG gum Take 1 each (2 mg total) by mouth as needed for smoking cessation.   Semaglutide,0.25 or 0.5MG /DOS, (OZEMPIC, 0.25 OR 0.5 MG/DOSE,) 2 MG/3ML SOPN Inject 0.25 mg into the skin once a week.   sildenafil (REVATIO) 20 MG tablet Take 1 to 5 tablets 1 hour prior to intercourse as needed   tamsulosin (FLOMAX) 0.4 MG CAPS capsule 1 tab by mouth with evening meal.   vitamin B-12 (CYANOCOBALAMIN) 50 MCG tablet Take 50 mcg by mouth. 1 a day     Allergies  Allergen Reactions  Shellfish Allergy Swelling    Social History   Socioeconomic History   Marital status: Single    Spouse name: Not on file   Number of children: 1   Years of education: 11   Highest education level: Not on file  Occupational History   Not on file  Tobacco Use   Smoking status: Every Day    Current packs/day: 0.25    Average packs/day: 0.3 packs/day for 36.8 years (9.2 ttl pk-yrs)    Types: Cigarettes    Start date: 09/14/1986    Passive exposure: Current   Smokeless tobacco: Never  Vaping Use   Vaping status: Never Used  Substance and Sexual Activity   Alcohol use: Yes    Comment: 3 mixed drinks per month   Drug use: No   Sexual activity: Not Currently  Other  Topics Concern   Not on file  Social History Narrative   Lives with his brother.   Social Determinants of Health   Financial Resource Strain: Medium Risk (06/02/2023)   Overall Financial Resource Strain (CARDIA)    Difficulty of Paying Living Expenses: Somewhat hard  Food Insecurity: Food Insecurity Present (06/02/2023)   Hunger Vital Sign    Worried About Running Out of Food in the Last Year: Sometimes true    Ran Out of Food in the Last Year: Sometimes true  Transportation Needs: No Transportation Needs (06/02/2023)   PRAPARE - Administrator, Civil Service (Medical): No    Lack of Transportation (Non-Medical): No  Physical Activity: Not on file  Stress: Not on file  Social Connections: Unknown (12/30/2021)   Received from [facility], [facility]   Social Network    Social Network: Not on file  Intimate Partner Violence: Unknown (11/21/2021)   [facility], [facility]   HITS    Physically Hurt: Not on file    Insult or Talk Down To: Not on file    Threaten Physical Harm: Not on file    Scream or Curse: Not on file     Review of Systems: General: negative for chills, fever, night sweats or weight changes.  Cardiovascular: negative for chest pain, dyspnea on exertion, edema, orthopnea, palpitations, paroxysmal nocturnal dyspnea or shortness of breath Dermatological: negative for rash Respiratory: negative for cough or wheezing Urologic: negative for hematuria Abdominal: negative for nausea, vomiting, diarrhea, bright red blood per rectum, melena, or hematemesis Neurologic: negative for visual changes, syncope, or dizziness All other systems reviewed and are otherwise negative except as noted above.    Blood pressure (!) 92/54, pulse 84, height 5\' 10"  (1.778 m), weight 181 lb (82.1 kg).  General appearance: alert and no distress Neck: no adenopathy, no carotid bruit, no JVD, supple, symmetrical, trachea midline, and thyroid not enlarged,  symmetric, no tenderness/mass/nodules Lungs: clear to auscultation bilaterally Heart: regular rate and rhythm, S1, S2 normal, no murmur, click, rub or gallop Extremities: extremities normal, atraumatic, no cyanosis or edema Pulses: 2+ and symmetric Skin: Skin color, texture, turgor normal. No rashes or lesions Neurologic: Grossly normal  EKG not performed today      ASSESSMENT AND PLAN:   Mixed hyperlipidemia History of hyperlipidemia on statin therapy with lipid profile performed 05/31/2023 revealing a total cholesterol 155, LDL of 97 and HDL 39.  Tobacco abuse disorder Ongoing tobacco abuse of 1/2 pack/day with 25-pack-year smoking history recalcitrant to risk factor modification.  Exertional dyspnea Mr. Ksiazek was referred to me by Dr. Delrae Alfred because of dyspnea on exertion  which she has had progressively over the last 4-5.  He denies chest pain.  He does have positive cardiac risk factors.  I am going to get a 2D echo and a coronary calcium score to further evaluate.     Runell Gess MD FACP,FACC,FAHA, [facility] 06/21/2023 2:36 PM

## 2023-06-21 NOTE — Assessment & Plan Note (Signed)
History of hyperlipidemia on statin therapy with lipid profile performed 05/31/2023 revealing a total cholesterol 155, LDL of 97 and HDL 39.

## 2023-06-22 ENCOUNTER — Telehealth: Payer: Self-pay | Admitting: Licensed Clinical Social Worker

## 2023-06-22 NOTE — Progress Notes (Signed)
Heart and Vascular Care Navigation  06/22/2023  Ryan Hensley [birth year] [id number]  Reason for Referral: financial concerns Patient is participating in a Managed Medicaid Plan:Yes- [employer] with patient by telephone for initial visit for Heart and Vascular Care Coordination.                                                                                                   Assessment:                                     LCSW was able to reach pt this morning at [phone]. Introduced self, role, reason for call. Pt confirmed home address and PCP. His brother is his emergency contact and they reside together, he confirms PCP and currently has Medicaid ([employer]). He has not worked since 2022, receives Tree [employer] and is disheartened by how low he considers his monthly check. He and his brother split the rent and utilities- they are about $100 behind on rent. He receives [employer], uses [employer] for additional food assistance. He has access to transportation and drives himself to appointments, he has access to payor benefits through Medicaid as well.  He is concerned about costs of testing as he doesn't have disposable income for copays. LCSW discussed connecting him with community assistance and Patient Care Fund to create some additional income to pay those costs. Pt agreeable to me reviewing guidelines and sending appropriate resources to pt home. No additional questions at this time.    HRT/VAS Care Coordination     Patients Home Cardiology Office [facility]   Outpatient Care Team Social Worker   Social Worker Name: Octavio Graves, [place], [phone]   Living arrangements for the past 2 months Single Family Home   Lives with: Siblings   Patient Current Insurance Coverage Medicaid   Patient Has Concern With Paying Medical Bills Yes   Patient Concerns With Medical Bills concern with copays/coronary CT   Medical Bill Referrals: assistance programs    Does Patient Have Prescription Coverage? Yes       Social History:                                                                             SDOH Screenings   Food Insecurity: Food Insecurity Present (06/22/2023)  Housing: Medium Risk (06/22/2023)  Transportation Needs: No Transportation Needs (06/22/2023)  Utilities: Not At Risk (06/22/2023)  Depression (PHQ2-9): High Risk (01/28/2023)  Financial Resource Strain: Medium Risk (06/22/2023)  Social Connections: Unknown (12/30/2021)   Received from [facility], [facility]  Tobacco Use: High Risk (06/21/2023)  Health Literacy: Adequate Health Literacy (06/22/2023)    SDOH Interventions: Financial Resources:  Financial Strain Interventions: Other (Comment) (limited resources- mailed  community assistance, discussed Patient Care Fund, discussed LIEAP/Utility assistance) DSS for financial assistance  Food Insecurity:  Food Insecurity Interventions: Other (Comment) (uses pantries, has SNAP, will send additional food resources)  Housing Insecurity:  Housing Interventions: Other (Comment) (referral to Patient Care Fund)  Transportation:   Transportation Interventions: Intervention Not Indicated, Patient Resources (Friends/Family), Payor Benefit    Other Care Navigation Interventions:     Provided Pharmacy assistance resources  Pt denies any issues obtaining medications at this time- we discussed pharmacies that honor copay waivers if needed.    Follow-up plan:   LCSW mailed pt the following: my card, [facility] [employer], [employer] for additional food assistance, [employer] and LIEAP application (since he receives SSDI may be eligible on 07/18/23 to apply). Since pt meets presumptive eligibility for Patient Care Fund we may be able to assist with rent to get pt caught up and provide additional assistance so he can afford upcoming testing in budget. Will call pt again tomorrow and discuss needed  documents if interested.

## 2023-06-23 ENCOUNTER — Telehealth: Payer: Self-pay | Admitting: Licensed Clinical Social Worker

## 2023-06-23 NOTE — Telephone Encounter (Signed)
H&V Care Navigation CSW Progress Note  Clinical Social Worker contacted patient by phone to f/u on Patient Care Fund assistance for rent challenges. Was able to reach him at [phone], pt shared that he is still in need of assistance to cover rent and financial challenges posed by upcoming testing ordered. At his request I texted him needed documents: proof of income, lease/ledger from landlord, W9 from landlord, proof of SNAP award, signed patient care fund agreement.   Emailed blank Patient Care Fund agreement form and W9 to pt email [email or web address] after confirming address with pt. Will f/u to ensure paperwork returned.   Patient is participating in a Managed Medicaid Plan:  Yes- [employer]  SDOH Screenings   Food Insecurity: Food Insecurity Present (06/22/2023)  Housing: Medium Risk (06/22/2023)  Transportation Needs: No Transportation Needs (06/22/2023)  Utilities: Not At Risk (06/22/2023)  Depression (PHQ2-9): High Risk (01/28/2023)  Financial Resource Strain: Medium Risk (06/22/2023)  Social Connections: Unknown (12/30/2021)   Received from [facility], [facility]  Tobacco Use: High Risk (06/21/2023)  Health Literacy: Adequate Health Literacy (06/22/2023)    Ryan Hensley, MSW, LCSW Clinical Social Worker II [facility] Heart/Vascular Care Navigation  [phone]- work cell phone (preferred) [phone]- desk phone

## 2023-06-25 ENCOUNTER — Other Ambulatory Visit: Payer: Medicaid Other

## 2023-06-28 ENCOUNTER — Telehealth: Payer: Self-pay | Admitting: Licensed Clinical Social Worker

## 2023-06-28 NOTE — Telephone Encounter (Signed)
H&V Care Navigation CSW Progress Note  Clinical Social Worker contacted patient by phone to f/u on offer of assistance for rent through Patient Care Fund. Had sent paperwork needed to pt through email. Provided a text last week with list of needed documents.   Sent f/u text this morning to see if pt had received documents/had any additional questions.  Patient is participating in a Managed Medicaid Plan:  Yes-  [employer]  SDOH Screenings   Food Insecurity: Food Insecurity Present (06/22/2023)  Housing: Medium Risk (06/22/2023)  Transportation Needs: No Transportation Needs (06/22/2023)  Utilities: Not At Risk (06/22/2023)  Depression (PHQ2-9): High Risk (01/28/2023)  Financial Resource Strain: Medium Risk (06/22/2023)  Social Connections: Unknown (12/30/2021)   Received from [facility], [facility]  Tobacco Use: High Risk (06/21/2023)  Health Literacy: Adequate Health Literacy (06/22/2023)   Octavio Graves, MSW, LCSW Clinical Social Worker II [facility] Health Heart/Vascular Care Navigation  [phone]- work cell phone (preferred) [phone]- desk phone

## 2023-06-29 ENCOUNTER — Encounter: Payer: Medicaid Other | Admitting: Physical Therapy

## 2023-06-30 ENCOUNTER — Ambulatory Visit: Payer: Medicaid Other

## 2023-06-30 NOTE — Progress Notes (Unsigned)
Patient is taking bp medication consistency. Patient has not yet started ozempic as he had questions on how to use it. Told patient he can bring in medication and I will walk him through the instructions.

## 2023-07-01 ENCOUNTER — Telehealth: Payer: Self-pay | Admitting: Licensed Clinical Social Worker

## 2023-07-01 NOTE — Telephone Encounter (Signed)
H&V Care Navigation CSW Progress Note  Clinical Social Worker contacted patient by phone to f/u on offer of assistance for rent through Patient Care Fund. Had sent paperwork needed to pt through email. Provided a text last week with list of needed documents.    Sent additional f/u text this morning to see if pt had received documents/had any additional questions. He shares today that he has. I let him know I will be out of the office tomorrow, if still interested in receiving assistance he can give me a call Monday/moving forward before dropping off any paperwork. Pt responded he understands.     Patient is participating in a Managed Medicaid Plan:  Yes-  [employer]  SDOH Screenings   Food Insecurity: Food Insecurity Present (06/22/2023)  Housing: Medium Risk (06/22/2023)  Transportation Needs: No Transportation Needs (06/22/2023)  Utilities: Not At Risk (06/22/2023)  Depression (PHQ2-9): High Risk (01/28/2023)  Financial Resource Strain: Medium Risk (06/22/2023)  Social Connections: Unknown (12/30/2021)   [facility] from [facility], [facility]  Tobacco Use: High Risk (06/21/2023)  Health Literacy: Adequate Health Literacy (06/22/2023)    Octavio Graves, MSW, LCSW Clinical Social Worker II [facility] Heart/Vascular Care Navigation  [phone]- work cell phone (preferred) [phone]- desk phone

## 2023-07-05 ENCOUNTER — Ambulatory Visit: Admit: 2023-07-05 | Payer: Medicaid Other | Admitting: Urology

## 2023-07-05 SURGERY — PROSTATECTOMY, RADICAL, ROBOT-ASSISTED, LAPAROSCOPIC
Anesthesia: General

## 2023-07-06 ENCOUNTER — Encounter: Payer: Medicaid Other | Admitting: Physical Therapy

## 2023-07-06 ENCOUNTER — Telehealth: Payer: Self-pay | Admitting: Licensed Clinical Social Worker

## 2023-07-06 NOTE — Telephone Encounter (Signed)
H&V Care Navigation CSW Progress Note  Clinical Social Worker  received a text from pt  to share the following: "Good morning Ryan Hensley. I'm working on getting all the paperwork that you are asking for how much time do I have to send them to you."  I let pt know that assistance generally takes 10-14 days to process. I recommended he get in paperwork as soon as possible to avoid late fees if requesting something with due date.   Patient is participating in a Managed Medicaid Plan:  Yes- [employer]  SDOH Screenings   Food Insecurity: Food Insecurity Present (06/22/2023)  Housing: Medium Risk (06/22/2023)  Transportation Needs: No Transportation Needs (06/22/2023)  Utilities: Not At Risk (06/22/2023)  Depression (PHQ2-9): High Risk (01/28/2023)  Financial Resource Strain: Medium Risk (06/22/2023)  Social Connections: Unknown (12/30/2021)   Received from [facility], [facility]  Tobacco Use: High Risk (06/21/2023)  Health Literacy: Adequate Health Literacy (06/22/2023)    Ryan Hensley, MSW, LCSW Clinical Social Worker II [facility] Health Heart/Vascular Care Navigation  [phone]- work cell phone (preferred) [phone]- desk phone

## 2023-07-07 ENCOUNTER — Other Ambulatory Visit (HOSPITAL_BASED_OUTPATIENT_CLINIC_OR_DEPARTMENT_OTHER): Payer: Medicaid Other

## 2023-07-07 DIAGNOSIS — E782 Mixed hyperlipidemia: Secondary | ICD-10-CM

## 2023-07-07 DIAGNOSIS — R0609 Other forms of dyspnea: Secondary | ICD-10-CM

## 2023-07-07 LAB — ECHOCARDIOGRAM COMPLETE
Area-P 1/2: 3.42 cm2
S' Lateral: 2.39 cm

## 2023-07-12 ENCOUNTER — Encounter: Payer: Medicaid Other | Admitting: Physician Assistant

## 2023-07-13 ENCOUNTER — Ambulatory Visit: Payer: Medicaid Other | Admitting: Urology

## 2023-07-13 ENCOUNTER — Encounter: Payer: Medicaid Other | Admitting: Physical Therapy

## 2023-07-13 VITALS — BP 108/67 | HR 79

## 2023-07-13 DIAGNOSIS — E1165 Type 2 diabetes mellitus with hyperglycemia: Secondary | ICD-10-CM

## 2023-07-13 DIAGNOSIS — C61 Malignant neoplasm of prostate: Secondary | ICD-10-CM | POA: Diagnosis not present

## 2023-07-13 NOTE — Progress Notes (Signed)
Marcelle Overlie Plume,acting as a scribe for Vanna Scotland, MD.,have documented all relevant documentation on the behalf of Vanna Scotland, MD,as directed by  Vanna Scotland, MD while in the presence of Vanna Scotland, MD.  07/13/2023 11:22 AM   Ryan Hensley [birth year] [id number]  Referring provider: Julieanne Manson, MD [address] [place],  [place] [address]  Chief Complaint  Patient presents with   discussion on options for prostate cancer    HPI: 52 year-old male with unfavorable intermediate risk prostate cancer originally scheduled for prostatectomy this month which was deferred for ongoing poorly controlled diabetes.   He was known to have an elevated PSA to 5.6. He does have significant baseline urinary symptoms. He had a normal rectal exam. He underwent prostate biopsy on 04/13/2023 and it showed a 32.7 gram prostate, no median lobe. Biopsy revealed bilateral prostate cancer involving 4 of 6 cores on the right, Gleason 3+3, and 2 of 6 cores on the left, one of which was 4+3 to 30% of the tissue.   PET scan completed on 05/11/2023 shows no evidence of extraprostatic prostate cancer.   He also has urinary issues managed on Flomax and finasteride, along with erectile dysfunction.  Unfortunately, his hemoglobin A1C has not improved since his prostate cancer was diagnosed. His A1C is still 9.5 as of 06/03/2023. It was as high as 13.3 last year. In the interim, he has been prescribed Ozempic. It is unclear whether or not he started his medication. He is attempting to manage diabetes through diet, primarily consuming fish, chicken, and [place], and occasionally eating salads.    PMH: Past Medical History:  Diagnosis Date   Diabetes mellitus without complication (HCC)    GERD (gastroesophageal reflux disease)    Hyperlipidemia    Left eye injury, sequela 1978   Prostate cancer (HCC) 02/2023    Surgical History: Past Surgical History:  Procedure Laterality Date   boxer  fracture ORIF Right 2016   EYE SURGERY  2007   Strabismus surgery, left eye.  Age 67 yo.    Home Medications:  Allergies as of 07/13/2023       Reactions   Shellfish Allergy Swelling        Medication List        Accurate as of July 13, 2023 11:22 AM. If you have any questions, ask your nurse or doctor.          acetaminophen 500 MG tablet Commonly known as: TYLENOL Take 500 mg by mouth every 6 (six) hours as needed. Takes 2 tablet once a day   AgaMatrix Presto Test test strip Generic drug: glucose blood Check blood blood sugar twice daily before meals   AgaMatrix Presto w/Device Kit Check blood sugars twice daily before meals   AgaMatrix Ultra-Thin Lancets Misc Check blood glucose twice daily before meals   aspirin EC 81 MG tablet 1 tab by mouth daily with morning meal   atorvastatin 80 MG tablet Commonly known as: LIPITOR TAKE 1 TABLET BY MOUTH WITH EVENING MEAL   buPROPion 150 MG 12 hr tablet Commonly known as: WELLBUTRIN SR 1 tab by mouth in morning daily for 3 days then increase to 1 tab by mouth twice daily on day 4   Dexcom G6 Sensor Misc Blood glucose check 3 times daily before meals and as needed.   Dexcom G6 Transmitter Misc 1 Units by Does not apply route 3 (three) times daily before meals.   empagliflozin 10 MG Tabs tablet Commonly known as:  Jardiance Take 1 tablet (10 mg total) by mouth daily before breakfast.   famotidine 20 MG tablet Commonly known as: PEPCID TAKE 2 TABLETS BY MOUTH AT BEDTIME   finasteride 5 MG tablet Commonly known as: Proscar Take 1 tablet (5 mg total) by mouth daily.   gabapentin 100 MG capsule Commonly known as: NEURONTIN Continue 3 caps by mouth at bedtime and add another 1 cap in morning and increase by 1 cap every 3 days until taking 3 caps 3 times daily   glipiZIDE 5 MG tablet Commonly known as: GLUCOTROL TAKE 1 TABLET BY MOUTH TWICE DAILY WITH MEALS   ibuprofen 400 MG tablet Commonly known as:  ADVIL Take 1 tablet (400 mg total) by mouth every 8 (eight) hours as needed for moderate pain.   Lantus SoloStar 100 UNIT/ML Solostar Pen Generic drug: insulin glargine INJECT 15 UNITS SUBCUTANEOUS IN THE MORNING BEFORE BREAKFAST   metFORMIN 1000 MG tablet Commonly known as: GLUCOPHAGE TAKE 1 TABLET BY MOUTH TWICE DAILY WITH A MEAL   metoCLOPramide 5 MG tablet Commonly known as: REGLAN 1 tab by mouth before meal 3 times daily.   nicotine polacrilex 2 MG gum Commonly known as: NICORETTE Take 1 each (2 mg total) by mouth as needed for smoking cessation.   Ozempic (0.25 or 0.5 MG/DOSE) 2 MG/3ML Sopn Generic drug: Semaglutide(0.25 or 0.5MG /DOS) Inject 0.25 mg into the skin once a week.   sildenafil 20 MG tablet Commonly known as: Revatio Take 1 to 5 tablets 1 hour prior to intercourse as needed   tamsulosin 0.4 MG Caps capsule Commonly known as: FLOMAX 1 tab by mouth with evening meal.   vitamin B-12 50 MCG tablet Commonly known as: CYANOCOBALAMIN Take 50 mcg by mouth. 1 a day        Allergies:  Allergies  Allergen Reactions   Shellfish Allergy Swelling    Family History: Family History  Problem Relation Age of Onset   Hypertension Mother    Asthma Sister    Gout Brother    Kidney Stones Brother     Social History:  reports that he has been smoking cigarettes. He started smoking about 36 years ago. He has a 9.2 pack-year smoking history. He has been exposed to tobacco smoke. He has never used smokeless tobacco. He reports current alcohol use. He reports that he does not use drugs.   Physical Exam: BP 108/67   Pulse 79   Constitutional:  Alert and oriented, No acute distress. HEENT: [place] AT, moist mucus membranes.  Trachea midline, no masses. Neurologic: Grossly intact, no focal deficits, moving all 4 extremities. Psychiatric: Normal mood and affect.   Assessment & Plan:    1. Poorly controlled diabetes - At this point, he is at too high a risk for  surgical complication, including infection - He is attempting to manage diabetes through diet, primarily consuming fish, chicken, and [place], and occasionally eating salads.  - Continue taking Ozempic   2. Unfavorable, intermediate risk prostate cancer - We can continue to defer prostatectomy for another 3 months with hopes that he will have further improvement in his glucose control now that he has been started on Ozempic but there is a risk of interval development of local advancement of his prostate cancer - In the interim, we discussed radiation with hormones as equal treatment option in terms of cancer related outcomes. He prefers to wait and improve diabetes control for future surgery.  Declined radiation oncology referral.  Understands risk of progression.  Return in about 3 months (around 10/13/2023) for reassessment of diabetes control and reevaluate treatment options .  PSA  I have reviewed the above documentation for accuracy and completeness, and I agree with the above.   Vanna Scotland, MD   [facility] [address] [place], [place] [address] [phone]

## 2023-07-19 ENCOUNTER — Telehealth: Payer: Self-pay | Admitting: Licensed Clinical Social Worker

## 2023-07-19 NOTE — Telephone Encounter (Signed)
H&V Care Navigation CSW Progress Note  Clinical Social Worker contacted patient by phone at [phone] to f/u on Patient Care Fund Request. Pt states he is still collecting documents but could not tell me which ones he needed to get. I will send him information again about what required and I encouraged him to contact his landlord again ASAP about W9 as if not willing to complete that we cannot complete requuest. I await any documentation to complete request for rental assistance.  Patient is participating in a Managed Medicaid Plan:  Yes- [employer]   SDOH Screenings   Food Insecurity: Food Insecurity Present (06/22/2023)  Housing: Medium Risk (06/22/2023)  Transportation Needs: No Transportation Needs (06/22/2023)  Utilities: Not At Risk (06/22/2023)  Depression (PHQ2-9): High Risk (01/28/2023)  Financial Resource Strain: Medium Risk (06/22/2023)  Social Connections: Unknown (12/30/2021)   Received from [facility], [facility]  Tobacco Use: High Risk (06/21/2023)  Health Literacy: Adequate Health Literacy (06/22/2023)   Octavio Graves, MSW, LCSW Clinical Social Worker [facility] Health Heart/Vascular Care Navigation  [phone]- work cell phone (preferred) [phone]- desk phone

## 2023-07-20 ENCOUNTER — Encounter: Payer: Medicaid Other | Admitting: Physical Therapy

## 2023-07-27 ENCOUNTER — Encounter: Payer: Medicaid Other | Admitting: Physical Therapy

## 2023-07-30 ENCOUNTER — Other Ambulatory Visit: Payer: Medicaid Other

## 2023-07-30 ENCOUNTER — Telehealth: Payer: Self-pay

## 2023-07-30 VITALS — Wt 180.0 lb

## 2023-07-30 DIAGNOSIS — T50905A Adverse effect of unspecified drugs, medicaments and biological substances, initial encounter: Secondary | ICD-10-CM | POA: Diagnosis not present

## 2023-07-30 DIAGNOSIS — R634 Abnormal weight loss: Secondary | ICD-10-CM

## 2023-07-30 NOTE — Telephone Encounter (Signed)
Patient needs an appointment for eye problems.   We will call patient if there is a cancellation.

## 2023-08-03 ENCOUNTER — Encounter: Payer: Medicaid Other | Admitting: Physical Therapy

## 2023-08-03 MED ORDER — OZEMPIC (0.25 OR 0.5 MG/DOSE) 2 MG/3ML ~~LOC~~ SOPN: PEN_INJECTOR | SUBCUTANEOUS | 11 refills | Status: DC

## 2023-08-03 NOTE — Addendum Note (Signed)
Addended by: Marcene Duos on: 08/03/2023 08:44 AM   Modules accepted: Orders

## 2023-08-03 NOTE — Progress Notes (Addendum)
PLease have him increase his Ozempic to 0.5 mg weekly.   Bring in blood glucoses over next 2 weeks. To call if blood glucoses are dropping below 100 regularly or if having lows. I see he also called about visual disturbance.   If he is having blurry vision, it could be from his blood sugars coming down to a much lower level. If that is the case, it takes a while for the body to get accustomed to the lower sugars and vision to clear again.

## 2023-08-17 ENCOUNTER — Other Ambulatory Visit: Payer: Medicaid Other

## 2023-08-19 ENCOUNTER — Encounter: Payer: Self-pay | Admitting: Internal Medicine

## 2023-08-19 ENCOUNTER — Ambulatory Visit (INDEPENDENT_AMBULATORY_CARE_PROVIDER_SITE_OTHER): Payer: Medicaid Other | Admitting: Internal Medicine

## 2023-08-19 ENCOUNTER — Other Ambulatory Visit: Payer: Medicaid Other

## 2023-08-19 VITALS — BP 102/62 | HR 92 | Resp 16 | Ht 70.0 in | Wt 180.0 lb

## 2023-08-19 DIAGNOSIS — H0015 Chalazion left lower eyelid: Secondary | ICD-10-CM | POA: Diagnosis not present

## 2023-08-19 DIAGNOSIS — E118 Type 2 diabetes mellitus with unspecified complications: Secondary | ICD-10-CM

## 2023-08-19 NOTE — Patient Instructions (Signed)
 Warm pack to left lower eyelid 20 minutes 2-3 times daily until you get in with Dr. Dione Booze, the eye doctor

## 2023-08-19 NOTE — Progress Notes (Signed)
 Subjective:    Patient ID: Ryan Hensley, male   DOB: 03/28/1971, 53 y.o.   MRN: [id number]   HPI  Left lower eyelid swelling:  started with a sense of a hair or other foreign body in eye over 3 weeks ago.  He rubbed the eye.  Looked in the mirror some days later and noted a swelling in lower eyelid.  No tenderness over the swollen area.  No redness of conjunctivae or overlying the area involved in eyelid.  No discharge.  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  No light reception at all.    2.  DM:  has not been able to get Dexcom for monitoring and we have sent preauth 3 times at this point, last time on the 31st of December.  Patient states his medical coverage has told him everything is straight  and pharmacy states today when called that they are just not seeing the prior auth going through.    Current Meds  Medication Sig   acetaminophen  (TYLENOL ) 500 MG tablet Take 500 mg by mouth every 6 (six) hours as needed. Takes 2 tablet once a day   AgaMatrix Ultra-Thin Lancets MISC Check blood glucose twice daily before meals   aspirin  EC 81 MG tablet 1 tab by mouth daily with morning meal   atorvastatin  (LIPITOR) 80 MG tablet TAKE 1 TABLET BY MOUTH WITH EVENING MEAL   Blood Glucose Monitoring Suppl (AGAMATRIX PRESTO) w/Device KIT Check blood sugars twice daily before meals   buPROPion  (WELLBUTRIN  SR) 150 MG 12 hr tablet 1 tab by mouth in morning daily for 3 days then increase to 1 tab by mouth twice daily on day 4   empagliflozin  (JARDIANCE ) 10 MG TABS tablet Take 1 tablet (10 mg total) by mouth daily before breakfast.   famotidine  (PEPCID ) 20 MG tablet TAKE 2 TABLETS BY MOUTH AT BEDTIME   finasteride  (PROSCAR ) 5 MG tablet Take 1 tablet (5 mg total) by mouth daily.   gabapentin  (NEURONTIN ) 100 MG capsule Continue 3 caps by mouth at bedtime and add another 1 cap in morning and increase by 1 cap every 3 days until taking 3 caps 3 times daily   glipiZIDE  (GLUCOTROL ) 5 MG tablet  TAKE 1 TABLET BY MOUTH TWICE DAILY WITH MEALS   glucose blood (AGAMATRIX PRESTO TEST) test strip Check blood blood sugar twice daily before meals   ibuprofen  (ADVIL ) 400 MG tablet Take 1 tablet (400 mg total) by mouth every 8 (eight) hours as needed for moderate pain.   insulin  glargine (LANTUS  SOLOSTAR) 100 UNIT/ML Solostar Pen INJECT 15 UNITS SUBCUTANEOUS IN THE MORNING BEFORE BREAKFAST   metFORMIN  (GLUCOPHAGE ) 1000 MG tablet TAKE 1 TABLET BY MOUTH TWICE DAILY WITH A MEAL   metoCLOPramide  (REGLAN ) 5 MG tablet 1 tab by mouth before meal 3 times daily.   Semaglutide ,0.25 or 0.5MG /DOS, (OZEMPIC , 0.25 OR 0.5 MG/DOSE,) 2 MG/3ML SOPN Inject 0.5 mg subcutaneously once weekly   sildenafil  (REVATIO ) 20 MG tablet Take 1 to 5 tablets 1 hour prior to intercourse as needed   tamsulosin  (FLOMAX ) 0.4 MG CAPS capsule 1 tab by mouth with evening meal.   vitamin B-12 (CYANOCOBALAMIN) 50 MCG tablet Take 50 mcg by mouth. 1 a day   Allergies  Allergen Reactions   Shellfish Allergy Swelling     Review of Systems    Objective:   BP 102/62 (BP Location: Left Arm, Patient Position: Sitting, Cuff Size: Normal)   Pulse 92  Resp 16   Ht 5' 10 (1.778 m)   Wt 180 lb (81.6 kg)   BMI 25.83 kg/m   Physical Exam NAD HEENT:  Left eye:  Fixed opaque pupil.  Conjunctivae without injection.  Oval cystic lesion in temporal lower left lid  No overlying erythema on skin or conjunctival side of lesion.  Perhaps a bit of and pustular head on conjunctival side.   Right eye normal.   Assessment & Plan   Left lower lid chalazion:  warm pack for 20 minutes 2-3 times daily.  Referral to ophthalmology, Dr. Octavia for more definitive treatment if does not open and drain.  Suspect this is fairly chronic.    2.  DM:  Not clear when he started Ozempic , but sugars have been improved with high in December when tested of 169, but the majority in 115 to 150 range.  Check A1C a bit early as needs to get his urologic surgery/care  performed.   Checking with his coverage to see why prior auth for Dexcom not going through.

## 2023-08-19 NOTE — Telephone Encounter (Signed)
 Patient has been scheduled

## 2023-08-20 LAB — HGB A1C W/O EAG: Hgb A1c MFr Bld: 8.4 % — ABNORMAL HIGH (ref 4.8–5.6)

## 2023-08-24 ENCOUNTER — Encounter: Payer: Medicaid Other | Admitting: Urology

## 2023-08-25 ENCOUNTER — Encounter: Payer: Self-pay | Admitting: Internal Medicine

## 2023-08-30 ENCOUNTER — Telehealth: Payer: Self-pay

## 2023-08-30 NOTE — Telephone Encounter (Signed)
 Dr. Delrae Alfred and her team called fro [facility] for [place] to look at patients latest labs. A1C has improved. They wanted to check and see what number it would need to be at for a reschedule of the prostatectomy?

## 2023-08-31 NOTE — Telephone Encounter (Signed)
 Ideally, less than 8 but closer to 7 would be better.  Did she say where he is at now?  Vanna Scotland, MD

## 2023-10-21 ENCOUNTER — Telehealth: Payer: Self-pay | Admitting: Internal Medicine

## 2023-10-21 NOTE — Telephone Encounter (Signed)
 Faxed in request for for Omnipod insulin pump, which we have not discussed and not clear he is aware of work involved.   Currently just on once daily Lantus and thought we were more so looking for continuous monitoring.

## 2023-11-03 ENCOUNTER — Ambulatory Visit: Payer: Self-pay | Admitting: Urology

## 2023-11-04 NOTE — Telephone Encounter (Signed)
 Patient did request omnipod. Omnipod and Dexcom are not cover by his insurance. Patient said that he does not want it if it is not covered by his insurance.

## 2023-12-01 ENCOUNTER — Ambulatory Visit (INDEPENDENT_AMBULATORY_CARE_PROVIDER_SITE_OTHER): Payer: Medicaid Other | Admitting: Internal Medicine

## 2023-12-01 ENCOUNTER — Encounter: Payer: Self-pay | Admitting: Internal Medicine

## 2023-12-01 VITALS — BP 110/78 | HR 77 | Resp 16 | Ht 70.0 in | Wt 171.0 lb

## 2023-12-01 DIAGNOSIS — E782 Mixed hyperlipidemia: Secondary | ICD-10-CM

## 2023-12-01 DIAGNOSIS — R809 Proteinuria, unspecified: Secondary | ICD-10-CM | POA: Diagnosis not present

## 2023-12-01 DIAGNOSIS — M5416 Radiculopathy, lumbar region: Secondary | ICD-10-CM

## 2023-12-01 DIAGNOSIS — L731 Pseudofolliculitis barbae: Secondary | ICD-10-CM

## 2023-12-01 DIAGNOSIS — Z23 Encounter for immunization: Secondary | ICD-10-CM

## 2023-12-01 DIAGNOSIS — Z79899 Other long term (current) drug therapy: Secondary | ICD-10-CM

## 2023-12-01 DIAGNOSIS — E118 Type 2 diabetes mellitus with unspecified complications: Secondary | ICD-10-CM | POA: Diagnosis not present

## 2023-12-01 LAB — GLUCOSE, POCT (MANUAL RESULT ENTRY): POC Glucose: 142 mg/dL — AB (ref 70–99)

## 2023-12-01 NOTE — Patient Instructions (Signed)
 Call if sugars are less than 100 repeatedly so we can start wean of insulin

## 2023-12-01 NOTE — Progress Notes (Signed)
 Subjective:    Patient ID: Ryan Hensley, male   DOB: Nov 19, 1970, 53 y.o.   MRN: [id number]   HPI   DM:  A1C was 8.4% in January.  Sugars running 113 to 130 in the morning.  Before dinner can get up to 140.  Currently, missing his Ozempic 3 times monthly.  He gest up early, but sits for hours before giving his meds.  States he is not missing his other meds.   Has lost 9 lbs since December. States he walks his friend's dog 3 times daily.  Does some weights for his arms.   He is stressed from trying to get some back pay.    2.  Hyperlipidemia:  not quite at goal with LDL and HDL in October.    3.  Recurrence of low back pain recently after sitting on toilet and then standing.  Used stretches with weights and resolved.    4.  Acne on face comes and goes.  Has a lesion on tip of nose currently.  5.   Dental decay:  to have surgery for removal of teeth and dentures made in near future.    6.  Prostate CA:  has appt with Urology on 12/08/2023 to get rescheduled for radical prostatectomy.  7.  Tobacco Use Disorder:  still smoking 1/2 ppd.  Is using nicotine gum also.     Current Meds  Medication Sig   acetaminophen (TYLENOL) 500 MG tablet Take 500 mg by mouth every 6 (six) hours as needed. Takes 2 tablet once a day   AgaMatrix Ultra-Thin Lancets MISC Check blood glucose twice daily before meals   aspirin EC 81 MG tablet 1 tab by mouth daily with morning meal   atorvastatin (LIPITOR) 80 MG tablet TAKE 1 TABLET BY MOUTH WITH EVENING MEAL   Blood Glucose Monitoring Suppl (AGAMATRIX PRESTO) w/Device KIT Check blood sugars twice daily before meals   buPROPion (WELLBUTRIN SR) 150 MG 12 hr tablet 1 tab by mouth in morning daily for 3 days then increase to 1 tab by mouth twice daily on day 4   empagliflozin (JARDIANCE) 10 MG TABS tablet Take 1 tablet (10 mg total) by mouth daily before breakfast.   famotidine (PEPCID) 20 MG tablet TAKE 2 TABLETS BY MOUTH AT BEDTIME   finasteride (PROSCAR) 5 MG  tablet Take 1 tablet (5 mg total) by mouth daily.   gabapentin (NEURONTIN) 100 MG capsule Continue 3 caps by mouth at bedtime and add another 1 cap in morning and increase by 1 cap every 3 days until taking 3 caps 3 times daily   glipiZIDE (GLUCOTROL) 5 MG tablet TAKE 1 TABLET BY MOUTH TWICE DAILY WITH MEALS   glucose blood (AGAMATRIX PRESTO TEST) test strip Check blood blood sugar twice daily before meals   ibuprofen (ADVIL) 400 MG tablet Take 1 tablet (400 mg total) by mouth every 8 (eight) hours as needed for moderate pain.   insulin glargine (LANTUS SOLOSTAR) 100 UNIT/ML Solostar Pen INJECT 15 UNITS SUBCUTANEOUS IN THE MORNING BEFORE BREAKFAST   metFORMIN (GLUCOPHAGE) 1000 MG tablet TAKE 1 TABLET BY MOUTH TWICE DAILY WITH A MEAL   metoCLOPramide (REGLAN) 5 MG tablet 1 tab by mouth before meal 3 times daily.   nicotine polacrilex (NICORETTE) 2 MG gum Take 1 each (2 mg total) by mouth as needed for smoking cessation.   Semaglutide,0.25 or 0.5MG /DOS, (OZEMPIC, 0.25 OR 0.5 MG/DOSE,) 2 MG/3ML SOPN Inject 0.5 mg subcutaneously once weekly   sildenafil (REVATIO) 20  MG tablet Take 1 to 5 tablets 1 hour prior to intercourse as needed   tamsulosin (FLOMAX) 0.4 MG CAPS capsule 1 tab by mouth with evening meal.   vitamin B-12 (CYANOCOBALAMIN) 50 MCG tablet Take 50 mcg by mouth. 1 a day   Allergies  Allergen Reactions   Shellfish Allergy Swelling     Review of Systems    Objective:   BP 110/78 (BP Location: Left Arm, Patient Position: Sitting, Cuff Size: Normal)   Pulse 77   Resp 16   Wt 171 lb (77.6 kg)   SpO2 97%   BMI 24.54 kg/m   Physical Exam NAD HEENT:  Diffusely decayed teeth, missing teeth and recessed gingiva, both upper and lower.  Neck and beard area with many ingrown hairs.  Enlarged pore on tip of nose next to an inflammatory acne lesion Neck:  Supple, No adenopathy Chest:  CTA CV:  RRR with normal S1 and S2, No S3, S4 or murmur.  Carotid, radial and DP pulses normal and  equal. Abd:  S, NT, No HSM or mass, + BS LE:  No edema.   Assessment & Plan   DM with microalbuminuria:  reminder placed on phone to use Ozempic weekly on Mondays at 7 a.m.  Return in 2 weeks with twice daily sugars and weigh in.  To call if sugars running below 100 and will start wean of Lantus.  A1C and urine microalbumin/crea  2.  Hyperlipidemia:  FLP, CMP  3.  Dental decay:  unclear when he will have procedure to remove teeth  4.  Prostate cancer:  urged him to stay on top of DM and health issues so he can get his prostatectomy.    5.  Tobacco Use Disorder:  Encouraged making goal to stop while using Nicotine gum  6.  Low back pain:  went over stretches.    7.  Acne and ingrown hairs in beard area:  sending to Dr. Roderick Civatte for advice on beard care to avoid the ingrown hairs and also treatment of current situation. Warm pack area currently  8.  HM:  Pneumococcal 20 and to obtain COVID vaccination at pharmacy as should be covered.  CBC

## 2023-12-02 LAB — CBC WITH DIFFERENTIAL/PLATELET
Basophils Absolute: 0.1 10*3/uL (ref 0.0–0.2)
Basos: 1 %
EOS (ABSOLUTE): 0.4 10*3/uL (ref 0.0–0.4)
Eos: 3 %
Hematocrit: 46.2 % (ref 37.5–51.0)
Hemoglobin: 15.1 g/dL (ref 13.0–17.7)
Immature Grans (Abs): 0 10*3/uL (ref 0.0–0.1)
Immature Granulocytes: 0 %
Lymphocytes Absolute: 5 10*3/uL — ABNORMAL HIGH (ref 0.7–3.1)
Lymphs: 44 %
MCH: 29.2 pg (ref 26.6–33.0)
MCHC: 32.7 g/dL (ref 31.5–35.7)
MCV: 89 fL (ref 79–97)
Monocytes Absolute: 0.7 10*3/uL (ref 0.1–0.9)
Monocytes: 7 %
Neutrophils Absolute: 5.1 10*3/uL (ref 1.4–7.0)
Neutrophils: 45 %
Platelets: 272 10*3/uL (ref 150–450)
RBC: 5.17 x10E6/uL (ref 4.14–5.80)
RDW: 13.7 % (ref 11.6–15.4)
WBC: 11.3 10*3/uL — ABNORMAL HIGH (ref 3.4–10.8)

## 2023-12-02 LAB — MICROALBUMIN / CREATININE URINE RATIO
Creatinine, Urine: 138.2 mg/dL
Microalb/Creat Ratio: 34 mg/g{creat} — ABNORMAL HIGH (ref 0–29)
Microalbumin, Urine: 47.4 ug/mL

## 2023-12-02 LAB — COMPREHENSIVE METABOLIC PANEL WITH GFR
ALT: 24 IU/L (ref 0–44)
AST: 22 IU/L (ref 0–40)
Albumin: 4.4 g/dL (ref 3.8–4.9)
Alkaline Phosphatase: 125 IU/L — ABNORMAL HIGH (ref 44–121)
BUN/Creatinine Ratio: 12 (ref 9–20)
BUN: 12 mg/dL (ref 6–24)
Bilirubin Total: 0.4 mg/dL (ref 0.0–1.2)
CO2: 22 mmol/L (ref 20–29)
Calcium: 9.8 mg/dL (ref 8.7–10.2)
Chloride: 104 mmol/L (ref 96–106)
Creatinine, Ser: 0.97 mg/dL (ref 0.76–1.27)
Globulin, Total: 2.9 g/dL (ref 1.5–4.5)
Glucose: 123 mg/dL — ABNORMAL HIGH (ref 70–99)
Potassium: 5.1 mmol/L (ref 3.5–5.2)
Sodium: 140 mmol/L (ref 134–144)
Total Protein: 7.3 g/dL (ref 6.0–8.5)
eGFR: 93 mL/min/{1.73_m2} (ref >59)

## 2023-12-02 LAB — LIPID PANEL W/O CHOL/HDL RATIO
Cholesterol, Total: 146 mg/dL (ref 100–199)
HDL: 44 mg/dL (ref >39)
LDL Chol Calc (NIH): 85 mg/dL (ref 0–99)
Triglycerides: 88 mg/dL (ref 0–149)
VLDL Cholesterol Cal: 17 mg/dL (ref 5–40)

## 2023-12-02 LAB — HGB A1C W/O EAG: Hgb A1c MFr Bld: 7.6 % — ABNORMAL HIGH (ref 4.8–5.6)

## 2023-12-06 ENCOUNTER — Encounter: Payer: Self-pay | Admitting: Internal Medicine

## 2023-12-08 ENCOUNTER — Ambulatory Visit: Admitting: Urology

## 2023-12-14 ENCOUNTER — Telehealth: Payer: Self-pay

## 2023-12-14 NOTE — Telephone Encounter (Signed)
 Patient needs refills for test strips.

## 2023-12-15 MED ORDER — AGAMATRIX PRESTO TEST VI STRP: ORAL_STRIP | 11 refills | Status: DC

## 2023-12-15 NOTE — Telephone Encounter (Signed)
 done

## 2023-12-16 ENCOUNTER — Other Ambulatory Visit

## 2023-12-18 ENCOUNTER — Other Ambulatory Visit: Payer: Self-pay | Admitting: Internal Medicine

## 2024-01-12 ENCOUNTER — Ambulatory Visit: Admitting: Urology

## 2024-01-14 ENCOUNTER — Ambulatory Visit (INDEPENDENT_AMBULATORY_CARE_PROVIDER_SITE_OTHER): Admitting: Physician Assistant

## 2024-01-14 ENCOUNTER — Encounter: Payer: Self-pay | Admitting: Physician Assistant

## 2024-01-14 VITALS — BP 100/67 | HR 96 | Ht 70.0 in | Wt 170.0 lb

## 2024-01-14 DIAGNOSIS — C61 Malignant neoplasm of prostate: Secondary | ICD-10-CM

## 2024-01-14 NOTE — H&P (View-Only) (Signed)
 01/14/2024 11:36 AM   Ryan Hensley 04/11/1971 [id number]  CC: Chief Complaint  Patient presents with   Follow-up   HPI: Ryan Hensley is a 53 y.o. 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   Today he reports he has remained on Ozempic  and been maintaining a healthy diet.  His most recent A1c came down considerably from >13 2 years ago, now 7.6.  PMH: Past Medical History:  Diagnosis Date   Diabetes mellitus without complication (HCC)    GERD (gastroesophageal reflux disease)    Hyperlipidemia    Left eye injury, sequela 1978   Prostate cancer (HCC) 02/2023    Surgical History: Past Surgical History:  Procedure Laterality Date   boxer fracture ORIF Right 2016   EYE SURGERY  2007   Strabismus surgery, left eye.  Age 28 yo.    Home Medications:  Allergies as of 01/14/2024       Reactions   Shellfish Allergy Swelling        Medication List        Accurate as of Jan 14, 2024 11:36 AM. If you have any questions, ask your nurse or doctor.          ACCU-CHEK ACTIVE VI 1 Device by In Vitro route in the morning and at bedtime.   AgaMatrix Presto Test test strip Generic drug: glucose blood Check blood blood sugar twice daily before meals   acetaminophen  500 MG tablet Commonly known as: TYLENOL  Take 500 mg by mouth every 6 (six) hours as needed. Takes 2 tablet once a day   AgaMatrix Presto w/Device Kit Check blood sugars twice daily before meals   AgaMatrix Ultra-Thin Lancets Misc Check blood glucose twice daily before meals   aspirin  EC 81 MG tablet 1 tab by mouth daily with morning meal   atorvastatin  80 MG tablet Commonly known as: LIPITOR TAKE 1 TABLET BY MOUTH WITH EVENING MEAL   buPROPion  150 MG 12 hr tablet Commonly known as: WELLBUTRIN  SR 1 tab by mouth in morning daily for 3 days then increase to 1 tab by mouth twice daily  on day 4   empagliflozin  10 MG Tabs tablet Commonly known as: Jardiance  Take 1 tablet (10 mg total) by mouth daily before breakfast.   famotidine  20 MG tablet Commonly known as: PEPCID  TAKE 2 TABLETS BY MOUTH AT BEDTIME   finasteride  5 MG tablet Commonly known as: Proscar  Take 1 tablet (5 mg total) by mouth daily.   gabapentin  100 MG capsule Commonly known as: NEURONTIN  CONTINUE 3 CAPSULES AT NIGHT AND ADD 1 CAPSULE IN THE MORNING AND INCREASE BY 1 CAPSULE EVERY 3 DAYS UNTIL TAKING 3 CAPSULES 3 TIMES DAILY   glipiZIDE  5 MG tablet Commonly known as: GLUCOTROL  TAKE 1 TABLET BY MOUTH TWICE DAILY WITH MEALS   ibuprofen  400 MG tablet Commonly known as: ADVIL  Take 1 tablet (400 mg total) by mouth every 8 (eight) hours as needed for moderate pain.   Lantus  SoloStar 100 UNIT/ML Solostar Pen Generic drug: insulin  glargine INJECT 15 UNITS SUBCUTANEOUS IN THE MORNING BEFORE BREAKFAST   metFORMIN  1000 MG tablet Commonly known as: GLUCOPHAGE  TAKE 1 TABLET BY MOUTH TWICE DAILY WITH A MEAL   metoCLOPramide  5 MG tablet Commonly known as: REGLAN  1 tab by mouth before meal 3 times daily.   nicotine  polacrilex 2 MG gum Commonly known as: NICORETTE  Take 1 each (2 mg total) by mouth as  needed for smoking cessation.   Ozempic  (0.25 or 0.5 MG/DOSE) 2 MG/3ML Sopn Generic drug: Semaglutide (0.25 or 0.5MG /DOS) Inject 0.5 mg subcutaneously once weekly   sildenafil  20 MG tablet Commonly known as: Revatio  Take 1 to 5 tablets 1 hour prior to intercourse as needed   tamsulosin  0.4 MG Caps capsule Commonly known as: FLOMAX  1 tab by mouth with evening meal.   vitamin B-12 50 MCG tablet Commonly known as: CYANOCOBALAMIN Take 50 mcg by mouth. 1 a day        Allergies:  Allergies  Allergen Reactions   Shellfish Allergy Swelling    Family History: Family History  Problem Relation Age of Onset   Hypertension Mother    Asthma Sister    Gout Brother    Kidney Stones Brother      Social History:   reports that he has been smoking cigarettes. He started smoking about 37 years ago. He has a 9.3 pack-year smoking history. He has been exposed to tobacco smoke. He has never used smokeless tobacco. He reports current alcohol use. He reports that he does not use drugs.  Physical Exam: BP 100/67   Pulse 96   Ht 5\' 10"  (1.778 m)   Wt 170 lb (77.1 kg)   BMI 24.39 kg/m   Constitutional:  Alert and oriented, no acute distress, nontoxic appearing [place] Cardiovascular: No clubbing, cyanosis, or edema Respiratory: Normal respiratory effort, no increased work of breathing Skin: No rashes, bruises or suspicious lesions Neurologic: Grossly intact, no focal deficits, moving all 4 extremities Psychiatric: Normal mood and affect  Laboratory Data: Results for orders placed or performed in visit on 12/01/23  POCT glucose (manual entry)   Collection Time: 12/01/23 10:33 AM  Result Value Ref Range   POC Glucose 142 (A) 70 - 99 mg/dl  Hgb Y8M w/o eAG   Collection Time: 12/01/23 11:54 AM  Result Value Ref Range   Hgb A1c MFr Bld 7.6 (H) 4.8 - 5.6 %   Assessment & Plan:   1. Prostate cancer (HCC) (Primary) A1c is now below 8, okay for surgical scheduling.  Unfortunately, we discussed that Dr. Ace Holder does not have any remaining OR time to do his case before her departure.  I will discuss with her and reach out to him with the plan. He understands.  Return for Will call to discuss surgical options.  Kathreen Pare, PA-C  [facility] [place] [address] [place], [place] [address] [phone]

## 2024-01-14 NOTE — Progress Notes (Signed)
 01/14/2024 11:36 AM   Ryan Hensley 04/11/1971 [id number]  CC: Chief Complaint  Patient presents with   Follow-up   HPI: Ryan Hensley is a 53 y.o. 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   Today he reports he has remained on Ozempic  and been maintaining a healthy diet.  His most recent A1c came down considerably from >13 2 years ago, now 7.6.  PMH: Past Medical History:  Diagnosis Date   Diabetes mellitus without complication (HCC)    GERD (gastroesophageal reflux disease)    Hyperlipidemia    Left eye injury, sequela 1978   Prostate cancer (HCC) 02/2023    Surgical History: Past Surgical History:  Procedure Laterality Date   boxer fracture ORIF Right 2016   EYE SURGERY  2007   Strabismus surgery, left eye.  Age 28 yo.    Home Medications:  Allergies as of 01/14/2024       Reactions   Shellfish Allergy Swelling        Medication List        Accurate as of Jan 14, 2024 11:36 AM. If you have any questions, ask your nurse or doctor.          ACCU-CHEK ACTIVE VI 1 Device by In Vitro route in the morning and at bedtime.   AgaMatrix Presto Test test strip Generic drug: glucose blood Check blood blood sugar twice daily before meals   acetaminophen  500 MG tablet Commonly known as: TYLENOL  Take 500 mg by mouth every 6 (six) hours as needed. Takes 2 tablet once a day   AgaMatrix Presto w/Device Kit Check blood sugars twice daily before meals   AgaMatrix Ultra-Thin Lancets Misc Check blood glucose twice daily before meals   aspirin  EC 81 MG tablet 1 tab by mouth daily with morning meal   atorvastatin  80 MG tablet Commonly known as: LIPITOR TAKE 1 TABLET BY MOUTH WITH EVENING MEAL   buPROPion  150 MG 12 hr tablet Commonly known as: WELLBUTRIN  SR 1 tab by mouth in morning daily for 3 days then increase to 1 tab by mouth twice daily  on day 4   empagliflozin  10 MG Tabs tablet Commonly known as: Jardiance  Take 1 tablet (10 mg total) by mouth daily before breakfast.   famotidine  20 MG tablet Commonly known as: PEPCID  TAKE 2 TABLETS BY MOUTH AT BEDTIME   finasteride  5 MG tablet Commonly known as: Proscar  Take 1 tablet (5 mg total) by mouth daily.   gabapentin  100 MG capsule Commonly known as: NEURONTIN  CONTINUE 3 CAPSULES AT NIGHT AND ADD 1 CAPSULE IN THE MORNING AND INCREASE BY 1 CAPSULE EVERY 3 DAYS UNTIL TAKING 3 CAPSULES 3 TIMES DAILY   glipiZIDE  5 MG tablet Commonly known as: GLUCOTROL  TAKE 1 TABLET BY MOUTH TWICE DAILY WITH MEALS   ibuprofen  400 MG tablet Commonly known as: ADVIL  Take 1 tablet (400 mg total) by mouth every 8 (eight) hours as needed for moderate pain.   Lantus  SoloStar 100 UNIT/ML Solostar Pen Generic drug: insulin  glargine INJECT 15 UNITS SUBCUTANEOUS IN THE MORNING BEFORE BREAKFAST   metFORMIN  1000 MG tablet Commonly known as: GLUCOPHAGE  TAKE 1 TABLET BY MOUTH TWICE DAILY WITH A MEAL   metoCLOPramide  5 MG tablet Commonly known as: REGLAN  1 tab by mouth before meal 3 times daily.   nicotine  polacrilex 2 MG gum Commonly known as: NICORETTE  Take 1 each (2 mg total) by mouth as  needed for smoking cessation.   Ozempic  (0.25 or 0.5 MG/DOSE) 2 MG/3ML Sopn Generic drug: Semaglutide (0.25 or 0.5MG /DOS) Inject 0.5 mg subcutaneously once weekly   sildenafil  20 MG tablet Commonly known as: Revatio  Take 1 to 5 tablets 1 hour prior to intercourse as needed   tamsulosin  0.4 MG Caps capsule Commonly known as: FLOMAX  1 tab by mouth with evening meal.   vitamin B-12 50 MCG tablet Commonly known as: CYANOCOBALAMIN Take 50 mcg by mouth. 1 a day        Allergies:  Allergies  Allergen Reactions   Shellfish Allergy Swelling    Family History: Family History  Problem Relation Age of Onset   Hypertension Mother    Asthma Sister    Gout Brother    Kidney Stones Brother      Social History:   reports that he has been smoking cigarettes. He started smoking about 37 years ago. He has a 9.3 pack-year smoking history. He has been exposed to tobacco smoke. He has never used smokeless tobacco. He reports current alcohol use. He reports that he does not use drugs.  Physical Exam: BP 100/67   Pulse 96   Ht 5\' 10"  (1.778 m)   Wt 170 lb (77.1 kg)   BMI 24.39 kg/m   Constitutional:  Alert and oriented, no acute distress, nontoxic appearing [place]: [place] Cardiovascular: No clubbing, cyanosis, or edema Respiratory: Normal respiratory effort, no increased work of breathing Skin: No rashes, bruises or suspicious lesions Neurologic: Grossly intact, no focal deficits, moving all 4 extremities Psychiatric: Normal mood and affect  Laboratory Data: Results for orders placed or performed in visit on 12/01/23  POCT glucose (manual entry)   Collection Time: 12/01/23 10:33 AM  Result Value Ref Range   POC Glucose 142 (A) 70 - 99 mg/dl  Hgb Y8M w/o eAG   Collection Time: 12/01/23 11:54 AM  Result Value Ref Range   Hgb A1c MFr Bld 7.6 (H) 4.8 - 5.6 %   Assessment & Plan:   1. Prostate cancer (HCC) (Primary) A1c is now below 8, okay for surgical scheduling.  Unfortunately, we discussed that Dr. Ace Holder does not have any remaining OR time to do his case before her departure.  I will discuss with her and reach out to him with the plan. He understands.  Return for Will call to discuss surgical options.  Kathreen Pare, PA-C  [facility] [place] [address] [place], [place] [address] [phone]

## 2024-01-17 ENCOUNTER — Telehealth: Payer: Self-pay

## 2024-01-17 ENCOUNTER — Other Ambulatory Visit: Payer: Self-pay

## 2024-01-17 DIAGNOSIS — C61 Malignant neoplasm of prostate: Secondary | ICD-10-CM

## 2024-01-17 NOTE — Progress Notes (Signed)
 Surgical Physician Order Form [facility] [place]   Dr. Dustin Gimenez, MD   * Scheduling expectation : Next Available   *Length of Case:    *Clearance needed:No   *Anticoagulation Instructions: Hold all anticoagulants   *Aspirin  Instructions: Hold Aspirin    *Post-op visit Date/Instructions:  Foley removal 7 days, MD 6 weeks with PSA- Sninsky   *Diagnosis: Prostate Cancer   *Procedure: bilateral PLND, Robotic laparoscopic Prostatectomy (29562)     Additional orders: N/A   -Admit type: Observation   -Anesthesia: General   -VTE Prophylaxis Standing Order SCD's       Other:    -Standing Lab Orders Per Anesthesia     Lab other: UA&Urine Culture, CBC, BMP, INR, T&S   -Standing Test orders EKG/Chest x-ray per Anesthesia             Test other:    - Medications:  Ancef 2gm IV   -Other orders:  N/A

## 2024-01-17 NOTE — Telephone Encounter (Signed)
 Per Dr. Ace Holder, Patient is to be scheduled for Robotic Laparoscopic Radical Prostatectomy with Bilateral Pelvic Lymph Node Dissection   Mr. Lepkowski was contacted and possible surgical dates were discussed, Monday June 16th, 2025 was agreed upon for surgery.   Patient was directed to call [phone] between 1-3pm the day before surgery to find out surgical arrival time.  Instructions were given not to eat or drink from midnight on the night before surgery and have a driver for the day of surgery. On the surgery day patient was instructed to enter through the Medical Mall entrance of [facility] report the Same Day Surgery desk.   Pre-Admit Testing will be in contact via phone to set up an interview with the anesthesia team to review your history and medications prior to surgery.   Reminder of this information was sent via MyChart to the patient.

## 2024-01-17 NOTE — Progress Notes (Addendum)
   [facility] Surgical Posting Form  Surgery Date: Date: 01/31/2024  Surgeon: Dr. Dustin Gimenez, MD  Inpt ( No  )   Outpt (No)   Obs ( Yes  )   Diagnosis: C61 Prostate Cancer  -CPT: [phone], [phone]  Surgery: Robotic Laparoscopic Radical Prostatectomy with Bilateral Pelvic Lymph Node Dissection  Stop Anticoagulations: Yes, will need to hold ASA for 7 days prior  Cardiac/Medical/Pulmonary Clearance needed: No  *Orders entered into EPIC  Date: 01/17/24   *Case booked in EPIC  Date: 01/17/24  *Notified pt of Surgery: Date: 01/17/24  PRE-OP UA & CX: yes, will also obtain, CBC, BMP, INR and Type and Screen  *Placed into Prior Authorization Work Que Date: 01/17/24  Assistant/laser/rep:Yes, Dr. Estanislao Heimlich to assist  Patient aware to hold Ozempic  for at least 1 week prior to surgery.

## 2024-01-26 ENCOUNTER — Other Ambulatory Visit: Payer: Self-pay

## 2024-01-26 ENCOUNTER — Encounter
Admission: RE | Admit: 2024-01-26 | Discharge: 2024-01-26 | Disposition: A | Discharge status: Home / Self Care | Source: Ambulatory Visit | Attending: Urology | Admitting: Urology

## 2024-01-26 DIAGNOSIS — Z01810 Encounter for preprocedural cardiovascular examination: Secondary | ICD-10-CM

## 2024-01-26 DIAGNOSIS — E119 Type 2 diabetes mellitus without complications: Secondary | ICD-10-CM

## 2024-01-26 DIAGNOSIS — Z01818 Encounter for other preprocedural examination: Secondary | ICD-10-CM

## 2024-01-26 HISTORY — DX: Elevated prostate specific antigen (PSA): R97.20

## 2024-01-26 HISTORY — DX: Dyspnea, unspecified: R06.00

## 2024-01-26 HISTORY — DX: Type 2 diabetes mellitus without complications: E11.9

## 2024-01-26 HISTORY — DX: Tobacco use: Z72.0

## 2024-01-26 HISTORY — DX: Type 2 diabetes mellitus with diabetic neuropathy, unspecified: E11.40

## 2024-01-26 HISTORY — DX: Benign prostatic hyperplasia without lower urinary tract symptoms: N40.0

## 2024-01-26 HISTORY — DX: Type 2 diabetes mellitus with unspecified diabetic retinopathy without macular edema: E11.319

## 2024-01-26 HISTORY — DX: Other ill-defined heart diseases: I51.89

## 2024-01-26 NOTE — Patient Instructions (Addendum)
 Your procedure is scheduled on:01-31-24 Monday Report to the Registration Desk on the 1st floor of the Medical Mall.Then proceed to the 2nd floor Surgery Desk To find out your arrival time, please call [phone] between 1PM - 3PM on:01-28-24 Friday If your arrival time is 6:00 am, do not arrive before that time as the Medical Mall entrance doors do not open until 6:00 am.  REMEMBER: Instructions that are not followed completely may result in serious medical risk, up to and including death; or upon the discretion of your surgeon and anesthesiologist your surgery may need to be rescheduled.  Do not eat food OR drink any liquids after midnight the night before surgery.  No gum chewing or hard candies.  Clear Liquid diet the ENTIRE day (01-30-24 Sunday) prior to surgery per Dr Jeni Mitten instructions   One week prior to surgery:Stop NOW (01-26-24) Stop Anti-inflammatories (NSAIDS) such as Advil , Aleve , Ibuprofen , Motrin , Naproxen , Naprosyn  and Aspirin  based products such as Excedrin, Goody's Powder, BC Powder. Stop ANY OVER THE COUNTER supplements until after surgery (Vitamin B 12)  You may however, continue to take Tylenol  if needed for pain up until the day of surgery.  Stop Semaglutide  7 days prior to surgery-Do NOT take again until AFTER your surgery  Stop empagliflozin  (JARDIANCE ) 3 days prior to surgery-Last dose will be on 01-27-24 Thursday  Stop metFORMIN  (GLUCOPHAGE ) 2 days prior to surgery-Last dose will be on 01-28-24 Friday  Stop sildenafil  (REVATIO ) 2 days prior to surgery-Last dose will be on 01-28-24 Friday  Last dose of 81 mg Aspirin  was approximately 2 weeks ago  Continue taking all of your other prescription medications up until the day of surgery.  ON THE DAY OF SURGERY ONLY TAKE THESE MEDICATIONS WITH SIPS OF WATER: -buPROPion  (WELLBUTRIN  SR)  -finasteride  (PROSCAR )  -gabapentin  (NEURONTIN )  -metoCLOPramide  (REGLAN )   Do NOT take any Insulin  the morning of your  surgery  No Alcohol for 24 hours before or after surgery.  No Smoking including e-cigarettes for 24 hours before surgery.  No chewable tobacco products for at least 6 hours before surgery.  No nicotine  patches on the day of surgery.  Do not use any recreational drugs for at least a week (preferably 2 weeks) before your surgery.  Please be advised that the combination of cocaine and anesthesia may have negative outcomes, up to and including death. If you test positive for cocaine, your surgery will be cancelled.  On the morning of surgery brush your teeth with toothpaste and water, you may rinse your mouth with mouthwash if you wish. Do not swallow any toothpaste or mouthwash.  Use CHG Soap as directed on instruction sheet.  Do not wear jewelry, make-up, hairpins, clips or nail polish.  For welded (permanent) jewelry: bracelets, anklets, waist bands, etc.  Please have this removed prior to surgery.  If it is not removed, there is a chance that hospital personnel will need to cut it off on the day of surgery.  Do not wear lotions, powders, or perfumes.   Do not shave body hair from the neck down 48 hours before surgery.  Contact lenses, hearing aids and dentures may not be worn into surgery.  Do not bring valuables to the hospital. [facility] is not responsible for any missing/lost belongings or valuables.   Notify your doctor if there is any change in your medical condition (cold, fever, infection).  Wear comfortable clothing (specific to your surgery type) to the hospital.  After surgery, you can help prevent  lung complications by doing breathing exercises.  Take deep breaths and cough every 1-2 hours. Your doctor may order a device called an Incentive Spirometer to help you take deep breaths. When coughing or sneezing, hold a pillow firmly against your incision with both hands. This is called "splinting." Doing this helps protect your incision. It also decreases belly  discomfort.  If you are being admitted to the hospital overnight, leave your suitcase in the car. After surgery it may be brought to your room.  In case of increased patient census, it may be necessary for you, the patient, to continue your postoperative care in the Same Day Surgery department.  If you are being discharged the day of surgery, you will not be allowed to drive home. You will need a responsible individual to drive you home and stay with you for 24 hours after surgery.   If you are taking public transportation, you will need to have a responsible individual with you.  Please call the Pre-admissions Testing Dept. at [phone] if you have any questions about these instructions.  Surgery Visitation Policy:  Patients having surgery or a procedure may have two visitors.  Children under the age of 46 must have an adult with them who is not the patient.  Inpatient Visitation:    Visiting hours are 7 a.m. to 8 p.m. Up to four visitors are allowed at one time in a patient room. The visitors may rotate out with other people during the day.  One visitor age 42 or older may stay with the patient overnight and must be in the room by 8 p.m.     Preparing for Surgery with CHLORHEXIDINE GLUCONATE (CHG) Soap  Chlorhexidine Gluconate (CHG) Soap  o An antiseptic cleaner that kills germs and bonds with the skin to continue killing germs even after washing  o Used for showering the night before surgery and morning of surgery  Before surgery, you can play an important role by reducing the number of germs on your skin.  CHG (Chlorhexidine gluconate) soap is an antiseptic cleanser which kills germs and bonds with the skin to continue killing germs even after washing.  Please do not use if you have an allergy to CHG or antibacterial soaps. If your skin becomes reddened/irritated stop using the CHG.  1. Shower the NIGHT BEFORE SURGERY and the MORNING OF SURGERY with CHG soap.  2. If  you choose to wash your hair, wash your hair first as usual with your normal shampoo.  3. After shampooing, rinse your hair and body thoroughly to remove the shampoo.  4. Use CHG as you would any other liquid soap. You can apply CHG directly to the skin and wash gently with a scrungie or a clean washcloth.  5. Apply the CHG soap to your body only from the neck down. Do not use on open wounds or open sores. Avoid contact with your eyes, ears, mouth, and genitals (private parts). Wash face and genitals (private parts) with your normal soap.  6. Wash thoroughly, paying special attention to the area where your surgery will be performed.  7. Thoroughly rinse your body with warm water.  8. Do not shower/wash with your normal soap after using and rinsing off the CHG soap.  9. Pat yourself dry with a clean towel.  10. Wear clean pajamas to bed the night before surgery.  12. Place clean sheets on your bed the night of your first shower and do not sleep with pets.  13.  Shower again with the CHG soap on the day of surgery prior to arriving at the hospital.  14. Do not apply any deodorants/lotions/powders.  15. Please wear clean clothes to the hospital.

## 2024-01-27 ENCOUNTER — Encounter
Admission: RE | Admit: 2024-01-27 | Discharge: 2024-01-27 | Disposition: A | Discharge status: Home / Self Care | Source: Ambulatory Visit | Attending: Urology | Admitting: Urology

## 2024-01-27 DIAGNOSIS — C61 Malignant neoplasm of prostate: Secondary | ICD-10-CM | POA: Insufficient documentation

## 2024-01-27 DIAGNOSIS — Z794 Long term (current) use of insulin: Secondary | ICD-10-CM | POA: Diagnosis not present

## 2024-01-27 DIAGNOSIS — Z01810 Encounter for preprocedural cardiovascular examination: Secondary | ICD-10-CM | POA: Diagnosis not present

## 2024-01-27 DIAGNOSIS — Z01818 Encounter for other preprocedural examination: Secondary | ICD-10-CM | POA: Diagnosis present

## 2024-01-27 DIAGNOSIS — E119 Type 2 diabetes mellitus without complications: Secondary | ICD-10-CM | POA: Diagnosis not present

## 2024-01-27 LAB — CBC
HCT: 45.2 % (ref 39.0–52.0)
Hemoglobin: 14.8 g/dL (ref 13.0–17.0)
MCH: 28.3 pg (ref 26.0–34.0)
MCHC: 32.7 g/dL (ref 30.0–36.0)
MCV: 86.4 fL (ref 80.0–100.0)
Platelets: 159 10*3/uL (ref 150–400)
RBC: 5.23 MIL/uL (ref 4.22–5.81)
RDW: 14.7 % (ref 11.5–15.5)
WBC: 10.2 10*3/uL (ref 4.0–10.5)
nRBC: 0 % (ref 0.0–0.2)

## 2024-01-27 LAB — URINALYSIS, COMPLETE (UACMP) WITH MICROSCOPIC
Bacteria, UA: NONE SEEN
Bilirubin Urine: NEGATIVE
Glucose, UA: NEGATIVE mg/dL
Hgb urine dipstick: NEGATIVE
Ketones, ur: NEGATIVE mg/dL
Leukocytes,Ua: NEGATIVE
Nitrite: NEGATIVE
Protein, ur: NEGATIVE mg/dL
Specific Gravity, Urine: 1.016 (ref 1.005–1.030)
pH: 6 (ref 5.0–8.0)

## 2024-01-27 LAB — TYPE AND SCREEN
ABO/RH(D): O POS
Antibody Screen: NEGATIVE

## 2024-01-27 LAB — BASIC METABOLIC PANEL WITH GFR
Anion gap: 9 (ref 5–15)
BUN: 11 mg/dL (ref 6–20)
CO2: 25 mmol/L (ref 22–32)
Calcium: 9.4 mg/dL (ref 8.9–10.3)
Chloride: 104 mmol/L (ref 98–111)
Creatinine, Ser: 1 mg/dL (ref 0.61–1.24)
GFR, Estimated: >60 mL/min (ref >60)
Glucose, Bld: 118 mg/dL — ABNORMAL HIGH (ref 70–99)
Potassium: 4.5 mmol/L (ref 3.5–5.1)
Sodium: 138 mmol/L (ref 135–145)

## 2024-01-27 LAB — PROTIME-INR
INR: 0.9 (ref 0.8–1.2)
Prothrombin Time: 12.3 s (ref 11.4–15.2)

## 2024-01-28 LAB — URINE CULTURE: Culture: NO GROWTH

## 2024-01-31 ENCOUNTER — Other Ambulatory Visit: Payer: Self-pay

## 2024-01-31 ENCOUNTER — Ambulatory Visit

## 2024-01-31 ENCOUNTER — Observation Stay
Admission: RE | Admit: 2024-01-31 | Discharge: 2024-02-01 | Disposition: A | Discharge status: Home / Self Care | Attending: Urology | Admitting: Urology

## 2024-01-31 ENCOUNTER — Encounter: Payer: Self-pay | Admitting: Urology

## 2024-01-31 ENCOUNTER — Ambulatory Visit: Payer: Self-pay | Admitting: Urgent Care

## 2024-01-31 ENCOUNTER — Encounter
Admission: RE | Disposition: A | Discharge status: Home / Self Care | Payer: Self-pay | Source: Home / Self Care | Attending: Urology

## 2024-01-31 DIAGNOSIS — F17210 Nicotine dependence, cigarettes, uncomplicated: Secondary | ICD-10-CM | POA: Insufficient documentation

## 2024-01-31 DIAGNOSIS — E119 Type 2 diabetes mellitus without complications: Secondary | ICD-10-CM | POA: Insufficient documentation

## 2024-01-31 DIAGNOSIS — C61 Malignant neoplasm of prostate: Principal | ICD-10-CM | POA: Insufficient documentation

## 2024-01-31 DIAGNOSIS — Z01818 Encounter for other preprocedural examination: Secondary | ICD-10-CM

## 2024-01-31 HISTORY — PX: ROBOT ASSISTED LAPAROSCOPIC RADICAL PROSTATECTOMY: SHX5141

## 2024-01-31 HISTORY — PX: PELVIC LYMPH NODE DISSECTION: SHX6543

## 2024-01-31 LAB — GLUCOSE, CAPILLARY
Glucose-Capillary: 137 mg/dL — ABNORMAL HIGH (ref 70–99)
Glucose-Capillary: 214 mg/dL — ABNORMAL HIGH (ref 70–99)
Glucose-Capillary: 194 mg/dL — ABNORMAL HIGH (ref 70–99)
Glucose-Capillary: 123 mg/dL — ABNORMAL HIGH (ref 70–99)
Glucose-Capillary: 145 mg/dL — ABNORMAL HIGH (ref 70–99)
Glucose-Capillary: 98 mg/dL (ref 70–99)

## 2024-01-31 LAB — ABO/RH: ABO/RH(D): O POS

## 2024-01-31 SURGERY — PROSTATECTOMY, RADICAL, ROBOT-ASSISTED, LAPAROSCOPIC
Anesthesia: General

## 2024-01-31 MED ORDER — DOCUSATE SODIUM 100 MG PO CAPS
100.0000 mg | ORAL_CAPSULE | Freq: Two times a day (BID) | ORAL | Status: DC
  Administered 2024-01-31 – 2024-02-01 (×2): 100 mg via ORAL
  Filled 2024-01-31 (×2): qty 1

## 2024-01-31 MED ORDER — BLISTEX MEDICATED EX OINT
TOPICAL_OINTMENT | CUTANEOUS | Status: DC | PRN
  Filled 2024-01-31: qty 6.3

## 2024-01-31 MED ORDER — DEXAMETHASONE SODIUM PHOSPHATE 10 MG/ML IJ SOLN
INTRAMUSCULAR | Status: AC
  Filled 2024-01-31: qty 1

## 2024-01-31 MED ORDER — ROCURONIUM BROMIDE 10 MG/ML (PF) SYRINGE
PREFILLED_SYRINGE | INTRAVENOUS | Status: AC
  Filled 2024-01-31: qty 10

## 2024-01-31 MED ORDER — FENTANYL CITRATE (PF) 100 MCG/2ML IJ SOLN: 25.0000 ug | INTRAMUSCULAR | Status: DC | PRN

## 2024-01-31 MED ORDER — ONDANSETRON HCL 4 MG/2ML IJ SOLN: 4.0000 mg | INTRAMUSCULAR | Status: DC | PRN

## 2024-01-31 MED ORDER — INSULIN ASPART 100 UNIT/ML IJ SOLN
0.0000 [IU] | Freq: Three times a day (TID) | INTRAMUSCULAR | Status: DC
  Administered 2024-01-31: 2 [IU] via SUBCUTANEOUS
  Administered 2024-02-01: 3 [IU] via SUBCUTANEOUS
  Filled 2024-01-31 (×2): qty 1

## 2024-01-31 MED ORDER — GLYCOPYRROLATE 0.2 MG/ML IJ SOLN
INTRAMUSCULAR | Status: DC | PRN
  Administered 2024-01-31: .1 mg via INTRAVENOUS

## 2024-01-31 MED ORDER — DIPHENHYDRAMINE HCL 12.5 MG/5ML PO ELIX: 12.5000 mg | ORAL_SOLUTION | Freq: Four times a day (QID) | ORAL | Status: DC | PRN

## 2024-01-31 MED ORDER — OXYCODONE-ACETAMINOPHEN 5-325 MG PO TABS
1.0000 | ORAL_TABLET | ORAL | Status: DC | PRN
  Administered 2024-01-31: 1 via ORAL
  Filled 2024-01-31: qty 2

## 2024-01-31 MED ORDER — DIPHENHYDRAMINE HCL 50 MG/ML IJ SOLN
12.5000 mg | Freq: Four times a day (QID) | INTRAMUSCULAR | Status: DC | PRN
Start: 2024-01-31 — End: 2024-02-01

## 2024-01-31 MED ORDER — ATORVASTATIN CALCIUM 20 MG PO TABS
80.0000 mg | ORAL_TABLET | Freq: Every day | ORAL | Status: DC
  Administered 2024-01-31: 80 mg via ORAL
  Filled 2024-01-31: qty 4

## 2024-01-31 MED ORDER — ACETAMINOPHEN 325 MG PO TABS
650.0000 mg | ORAL_TABLET | ORAL | Status: DC | PRN
Start: 2024-01-31 — End: 2024-02-01

## 2024-01-31 MED ORDER — INSULIN ASPART 100 UNIT/ML IJ SOLN: 0.0000 [IU] | Freq: Every day | INTRAMUSCULAR | Status: DC

## 2024-01-31 MED ORDER — DEXAMETHASONE SODIUM PHOSPHATE 10 MG/ML IJ SOLN: INTRAMUSCULAR | Status: DC | PRN

## 2024-01-31 MED ORDER — FENTANYL CITRATE (PF) 100 MCG/2ML IJ SOLN: INTRAMUSCULAR | Status: DC | PRN

## 2024-01-31 MED ORDER — CEFAZOLIN SODIUM-DEXTROSE 1-4 GM/50ML-% IV SOLN
1.0000 g | Freq: Three times a day (TID) | INTRAVENOUS | Status: AC
  Administered 2024-02-01: 1 g via INTRAVENOUS
  Filled 2024-01-31 (×3): qty 50

## 2024-01-31 MED ORDER — DEXMEDETOMIDINE HCL IN NACL 80 MCG/20ML IV SOLN
INTRAVENOUS | Status: AC
  Filled 2024-01-31: qty 20

## 2024-01-31 MED ORDER — LIDOCAINE HCL (CARDIAC) PF 100 MG/5ML IV SOSY: PREFILLED_SYRINGE | INTRAVENOUS | Status: DC | PRN

## 2024-01-31 MED ORDER — KETAMINE HCL 10 MG/ML IJ SOLN: INTRAMUSCULAR | Status: DC | PRN

## 2024-01-31 MED ORDER — FAMOTIDINE 20 MG PO TABS
20.0000 mg | ORAL_TABLET | Freq: Every day | ORAL | Status: DC
  Administered 2024-01-31: 20 mg via ORAL
  Filled 2024-01-31: qty 1

## 2024-01-31 MED ORDER — BUPIVACAINE HCL (PF) 0.5 % IJ SOLN
INTRAMUSCULAR | Status: AC
  Filled 2024-01-31: qty 30

## 2024-01-31 MED ORDER — CARMEX CLASSIC LIP BALM EX OINT: TOPICAL_OINTMENT | CUTANEOUS | Status: DC | PRN

## 2024-01-31 MED ORDER — CHLORHEXIDINE GLUCONATE 0.12 % MT SOLN
OROMUCOSAL | Status: AC
  Filled 2024-01-31: qty 15

## 2024-01-31 MED ORDER — HEPARIN SODIUM (PORCINE) 5000 UNIT/ML IJ SOLN
5000.0000 [IU] | Freq: Three times a day (TID) | INTRAMUSCULAR | Status: DC
  Administered 2024-01-31 – 2024-02-01 (×3): 5000 [IU] via SUBCUTANEOUS
  Filled 2024-01-31 (×3): qty 1

## 2024-01-31 MED ORDER — INSULIN ASPART 100 UNIT/ML IJ SOLN
4.0000 [IU] | Freq: Three times a day (TID) | INTRAMUSCULAR | Status: DC
  Administered 2024-01-31 – 2024-02-01 (×3): 4 [IU] via SUBCUTANEOUS
  Filled 2024-01-31 (×3): qty 1

## 2024-01-31 MED ORDER — PHENYLEPHRINE HCL-NACL 20-0.9 MG/250ML-% IV SOLN
INTRAVENOUS | Status: AC
  Filled 2024-01-31: qty 250

## 2024-01-31 MED ORDER — ORAL CARE MOUTH RINSE: 15.0000 mL | Freq: Once | OROMUCOSAL | Status: AC

## 2024-01-31 MED ORDER — CEFAZOLIN SODIUM-DEXTROSE 2-4 GM/100ML-% IV SOLN
INTRAVENOUS | Status: AC
  Filled 2024-01-31: qty 100

## 2024-01-31 MED ORDER — CEFAZOLIN SODIUM-DEXTROSE 2-4 GM/100ML-% IV SOLN: 2.0000 g | INTRAVENOUS | Status: AC

## 2024-01-31 MED ORDER — MORPHINE SULFATE (PF) 2 MG/ML IV SOLN
2.0000 mg | INTRAVENOUS | Status: DC | PRN
  Administered 2024-02-01: 2 mg via INTRAVENOUS
  Filled 2024-01-31: qty 1

## 2024-01-31 MED ORDER — ACETAMINOPHEN 10 MG/ML IV SOLN: INTRAVENOUS | Status: DC | PRN

## 2024-01-31 MED ORDER — OXYCODONE HCL 5 MG/5ML PO SOLN: 5.0000 mg | Freq: Once | ORAL | Status: DC | PRN

## 2024-01-31 MED ORDER — DROPERIDOL 2.5 MG/ML IJ SOLN: 0.6250 mg | Freq: Once | INTRAMUSCULAR | Status: DC | PRN

## 2024-01-31 MED ORDER — ONDANSETRON HCL 4 MG/2ML IJ SOLN
INTRAMUSCULAR | Status: AC
  Filled 2024-01-31: qty 2

## 2024-01-31 MED ORDER — PROPOFOL 10 MG/ML IV BOLUS: INTRAVENOUS | Status: DC | PRN

## 2024-01-31 MED ORDER — KETAMINE HCL 50 MG/5ML IJ SOSY: PREFILLED_SYRINGE | INTRAMUSCULAR | Status: DC | PRN

## 2024-01-31 MED ORDER — ONDANSETRON HCL 4 MG/2ML IJ SOLN: INTRAMUSCULAR | Status: DC | PRN

## 2024-01-31 MED ORDER — MIDAZOLAM HCL 2 MG/2ML IJ SOLN: INTRAMUSCULAR | Status: DC | PRN

## 2024-01-31 MED ORDER — OXYBUTYNIN CHLORIDE 5 MG PO TABS
5.0000 mg | ORAL_TABLET | Freq: Three times a day (TID) | ORAL | Status: DC | PRN
Start: 2024-01-31 — End: 2024-02-01

## 2024-01-31 MED ORDER — CHLORHEXIDINE GLUCONATE 0.12 % MT SOLN
15.0000 mL | Freq: Once | OROMUCOSAL | Status: AC
  Administered 2024-01-31: 15 mL via OROMUCOSAL

## 2024-01-31 MED ORDER — BUPIVACAINE HCL 0.5 % IJ SOLN
INTRAMUSCULAR | Status: DC | PRN
  Administered 2024-01-31: 20 mL

## 2024-01-31 MED ORDER — KETAMINE HCL 50 MG/5ML IJ SOSY
PREFILLED_SYRINGE | INTRAMUSCULAR | Status: AC
  Filled 2024-01-31: qty 5

## 2024-01-31 MED ORDER — MIDAZOLAM HCL 2 MG/2ML IJ SOLN
INTRAMUSCULAR | Status: AC
  Filled 2024-01-31: qty 2

## 2024-01-31 MED ORDER — BUPROPION HCL ER (SR) 150 MG PO TB12
150.0000 mg | ORAL_TABLET | ORAL | Status: DC
  Administered 2024-02-01: 150 mg via ORAL
  Filled 2024-01-31: qty 1

## 2024-01-31 MED ORDER — FENTANYL CITRATE (PF) 100 MCG/2ML IJ SOLN
INTRAMUSCULAR | Status: AC
  Filled 2024-01-31: qty 2

## 2024-01-31 MED ORDER — SODIUM CHLORIDE 0.9 % IV SOLN: INTRAVENOUS | Status: DC

## 2024-01-31 MED ORDER — GABAPENTIN 300 MG PO CAPS
300.0000 mg | ORAL_CAPSULE | Freq: Three times a day (TID) | ORAL | Status: DC
  Administered 2024-01-31 – 2024-02-01 (×3): 300 mg via ORAL
  Filled 2024-01-31 (×3): qty 1

## 2024-01-31 MED ORDER — FENTANYL CITRATE (PF) 100 MCG/2ML IJ SOLN
25.0000 ug | INTRAMUSCULAR | Status: DC | PRN
  Administered 2024-01-31 (×4): 25 ug via INTRAVENOUS

## 2024-01-31 MED ORDER — KETOROLAC TROMETHAMINE 30 MG/ML IJ SOLN
INTRAMUSCULAR | Status: AC
  Filled 2024-01-31: qty 1

## 2024-01-31 MED ORDER — ACETAMINOPHEN 10 MG/ML IV SOLN
INTRAVENOUS | Status: AC
  Filled 2024-01-31: qty 100

## 2024-01-31 MED ORDER — SODIUM CHLORIDE 0.9 % IV SOLN: INTRAVENOUS | Status: AC

## 2024-01-31 MED ORDER — ROCURONIUM BROMIDE 100 MG/10ML IV SOLN: INTRAVENOUS | Status: DC | PRN

## 2024-01-31 MED ORDER — DEXMEDETOMIDINE HCL IN NACL 80 MCG/20ML IV SOLN: INTRAVENOUS | Status: DC | PRN

## 2024-01-31 MED ORDER — DROPERIDOL 2.5 MG/ML IJ SOLN
0.6250 mg | Freq: Once | INTRAMUSCULAR | Status: DC | PRN
Start: 2024-01-31 — End: 2024-01-31

## 2024-01-31 MED ORDER — PROPOFOL 10 MG/ML IV BOLUS
INTRAVENOUS | Status: AC
  Filled 2024-01-31: qty 20

## 2024-01-31 MED ORDER — INSULIN ASPART 100 UNIT/ML IJ SOLN
4.0000 [IU] | Freq: Once | INTRAMUSCULAR | Status: AC
  Administered 2024-01-31: 4 [IU] via SUBCUTANEOUS

## 2024-01-31 MED ORDER — KETOROLAC TROMETHAMINE 30 MG/ML IJ SOLN: INTRAMUSCULAR | Status: DC | PRN

## 2024-01-31 MED ORDER — METOCLOPRAMIDE HCL 5 MG PO TABS: 5.0000 mg | ORAL_TABLET | Freq: Three times a day (TID) | ORAL | Status: DC | PRN

## 2024-01-31 MED ORDER — INSULIN ASPART 100 UNIT/ML IJ SOLN
INTRAMUSCULAR | Status: AC
Start: 2024-01-31 — End: 2024-01-31
  Filled 2024-01-31: qty 1

## 2024-01-31 MED ORDER — PHENYLEPHRINE 80 MCG/ML (10ML) SYRINGE FOR IV PUSH (FOR BLOOD PRESSURE SUPPORT): PREFILLED_SYRINGE | INTRAVENOUS | Status: DC | PRN

## 2024-01-31 MED ORDER — ACETAMINOPHEN 10 MG/ML IV SOLN: 1000.0000 mg | Freq: Once | INTRAVENOUS | Status: DC | PRN

## 2024-01-31 MED ORDER — PHENYLEPHRINE 80 MCG/ML (10ML) SYRINGE FOR IV PUSH (FOR BLOOD PRESSURE SUPPORT)
PREFILLED_SYRINGE | INTRAVENOUS | Status: AC
  Filled 2024-01-31: qty 10

## 2024-01-31 MED ORDER — OXYCODONE HCL 5 MG PO TABS: 5.0000 mg | ORAL_TABLET | Freq: Once | ORAL | Status: DC | PRN

## 2024-01-31 MED ORDER — SUGAMMADEX SODIUM 200 MG/2ML IV SOLN: INTRAVENOUS | Status: DC | PRN

## 2024-01-31 MED ORDER — GLYCOPYRROLATE 0.2 MG/ML IJ SOLN
INTRAMUSCULAR | Status: AC
  Filled 2024-01-31: qty 1

## 2024-01-31 MED ORDER — LIDOCAINE HCL (PF) 2 % IJ SOLN
INTRAMUSCULAR | Status: AC
  Filled 2024-01-31: qty 5

## 2024-01-31 SURGICAL SUPPLY — 73 items
ANCHOR TIS RET SYS 235ML (MISCELLANEOUS) ×2 IMPLANT
APPLICATOR SURGIFLO ENDO (HEMOSTASIS) ×2 IMPLANT
BAG URINE DRAIN 2000ML AR STRL (UROLOGICAL SUPPLIES) ×2 IMPLANT
BLADE CLIPPER SURG (BLADE) ×2 IMPLANT
CATH FOL 2WAY LX 18X5 (CATHETERS) ×4 IMPLANT
CLIP APPLIE 5 13 M/L LIGAMAX5 (MISCELLANEOUS) IMPLANT
CLIP LIGATING HEM O LOK PURPLE (MISCELLANEOUS) ×8 IMPLANT
COVER TIP SHEARS 8 DVNC (MISCELLANEOUS) ×2 IMPLANT
DERMABOND ADVANCED .7 DNX12 (GAUZE/BANDAGES/DRESSINGS) ×2 IMPLANT
DRAIN CHANNEL JP 15F RND 3/16 (MISCELLANEOUS) IMPLANT
DRAIN CHANNEL JP 19F RND 3/16 (MISCELLANEOUS) IMPLANT
DRAPE ARM DVNC X/XI (DISPOSABLE) ×8 IMPLANT
DRAPE COLUMN DVNC XI (DISPOSABLE) ×2 IMPLANT
DRAPE LEGGINS SURG 28X43 STRL (DRAPES) ×2 IMPLANT
DRAPE SHEET LG 3/4 BI-LAMINATE (DRAPES) ×2 IMPLANT
DRAPE SURG 17X11 SM STRL (DRAPES) ×8 IMPLANT
DRAPE UNDER BUTTOCK W/FLU (DRAPES) ×2 IMPLANT
DRIVER NDL LRG 8 DVNC XI (INSTRUMENTS) ×2 IMPLANT
DRIVER NDLE LRG 8 DVNC XI (INSTRUMENTS) ×2 IMPLANT
DRSG TELFA 3X8 NADH STRL (GAUZE/BANDAGES/DRESSINGS) ×2 IMPLANT
ELECTRODE REM PT RTRN 9FT ADLT (ELECTROSURGICAL) ×2 IMPLANT
EVACUATOR SILICONE 100CC (DRAIN) IMPLANT
FORCEPS BPLR 8 MD DVNC XI (FORCEP) ×2 IMPLANT
FORCEPS BPLR FENES DVNC XI (FORCEP) ×2 IMPLANT
FORCEPS PROGRASP DVNC XI (FORCEP) ×2 IMPLANT
GLOVE BIO SURGEON STRL SZ 6.5 (GLOVE) ×4 IMPLANT
GLOVE BIOGEL PI IND STRL 7.5 (GLOVE) ×2 IMPLANT
GOWN STRL REUS W/ TWL LRG LVL3 (GOWN DISPOSABLE) ×4 IMPLANT
GOWN STRL REUS W/ TWL XL LVL3 (GOWN DISPOSABLE) ×2 IMPLANT
GRASPER SUT TROCAR 14GX15 (MISCELLANEOUS) ×2 IMPLANT
HEMOSTAT SURGICEL 2X14 (HEMOSTASIS) ×2 IMPLANT
HOLDER FOLEY CATH W/STRAP (MISCELLANEOUS) ×2 IMPLANT
IRRIGATION STRYKERFLOW (MISCELLANEOUS) ×2 IMPLANT
KIT PINK PAD W/HEAD ARM REST (MISCELLANEOUS) ×2 IMPLANT
LABEL OR SOLS (LABEL) ×2 IMPLANT
MANIFOLD NEPTUNE II (INSTRUMENTS) ×2 IMPLANT
MARKER SKIN DUAL TIP RULER LAB (MISCELLANEOUS) ×2 IMPLANT
NDL HYPO 22X1.5 SAFETY MO (MISCELLANEOUS) ×2 IMPLANT
NDL INSUFFLATION 14GA 120MM (NEEDLE) ×2 IMPLANT
NEEDLE HYPO 22X1.5 SAFETY MO (MISCELLANEOUS) ×2 IMPLANT
NEEDLE INSUFFLATION 14GA 120MM (NEEDLE) ×2 IMPLANT
OBTURATOR OPTICALSTD 8 DVNC (TROCAR) ×2 IMPLANT
PACK LAP CHOLECYSTECTOMY (MISCELLANEOUS) ×2 IMPLANT
RELOAD STAPLE 60 2.6 WHT THN (STAPLE) IMPLANT
RELOAD STAPLER WHITE 60MM (STAPLE) ×2 IMPLANT
SCISSORS MNPLR CVD DVNC XI (INSTRUMENTS) ×2 IMPLANT
SEAL UNIV 5-12 XI (MISCELLANEOUS) ×8 IMPLANT
SET TUBE SMOKE EVAC HIGH FLOW (TUBING) ×2 IMPLANT
SOLUTION ELECTROSURG ANTI STCK (MISCELLANEOUS) ×2 IMPLANT
SPONGE T-LAP 4X18 ~~LOC~~+RFID (SPONGE) ×2 IMPLANT
SPONGE VERSALON 4X4 4PLY (MISCELLANEOUS) IMPLANT
STAPLE ECHEON FLEX 60 POW ENDO (STAPLE) IMPLANT
STAPLER SKIN PROX 35W (STAPLE) ×2 IMPLANT
STRAP SAFETY 5IN WIDE (MISCELLANEOUS) ×4 IMPLANT
SURGIFLO W/THROMBIN 8M KIT (HEMOSTASIS) ×2 IMPLANT
SURGILUBE 2OZ TUBE FLIPTOP (MISCELLANEOUS) ×2 IMPLANT
SUT SILK 2 0 SH (SUTURE) IMPLANT
SUT STRATA 3-0 15 RB-1 (SUTURE) ×2 IMPLANT
SUT STRATA 3-0 15 RB-1.5 (SUTURE) ×4 IMPLANT
SUT VIC AB 0 CT1 36 (SUTURE) ×2 IMPLANT
SUT VIC AB 2-0 SH 27XBRD (SUTURE) IMPLANT
SUT VICRYL 0 UR6 27IN ABS (SUTURE) ×4 IMPLANT
SUTURE EHLN 3-0 FS-10 30 BLK (SUTURE) IMPLANT
SUTURE MNCRL 4-0 27XMF (SUTURE) ×4 IMPLANT
SUTURE VLOC BRB 180 ABS3/0GR12 (SUTURE) IMPLANT
SYR 10ML LL (SYRINGE) ×2 IMPLANT
SYRINGE TOOMEY IRRIG 70ML (MISCELLANEOUS) ×2 IMPLANT
TAPE CLOTH 3X10 WHT NS LF (GAUZE/BANDAGES/DRESSINGS) ×4 IMPLANT
TRAP FLUID SMOKE EVACUATOR (MISCELLANEOUS) ×2 IMPLANT
TROCAR Z-THREAD FIOS 12X100MM (TROCAR) ×2 IMPLANT
TROCAR Z-THREAD FIOS 5X100MM (TROCAR) ×2 IMPLANT
WATER STERILE IRR 3000ML UROMA (IV SOLUTION) ×2 IMPLANT
WATER STERILE IRR 500ML POUR (IV SOLUTION) ×2 IMPLANT

## 2024-01-31 NOTE — Transfer of Care (Signed)
 Immediate Anesthesia Transfer of Care Note  Patient: Ryan Hensley  Procedure(s) Performed: PROSTATECTOMY, RADICAL, ROBOT-ASSISTED, LAPAROSCOPIC LYMPHADENECTOMY, PELVIS (Bilateral)  Patient Location: PACU  Anesthesia Type:General  Level of Consciousness: awake, alert , oriented, and patient cooperative  Airway & Oxygen Therapy: Patient Spontanous Breathing and Patient connected to face mask oxygen  Post-op Assessment: Report given to RN, Post -op Vital signs reviewed and stable, and Patient moving all extremities X 4  Post vital signs: Reviewed and stable  Last Vitals:  Vitals Value Taken Time  BP 176/95 01/31/24 10:36  Temp 36.4 C 01/31/24 10:36  Pulse 92 01/31/24 10:37  Resp 17 01/31/24 10:37  SpO2 100 % 01/31/24 10:37  Vitals shown include unfiled device data.  Last Pain:  Vitals:   01/31/24 0618  TempSrc: Temporal  PainSc: 4       Patients Stated Pain Goal: 1 (01/31/24 0618)  Complications: No notable events documented.

## 2024-01-31 NOTE — Anesthesia Procedure Notes (Addendum)
 Procedure Name: Intubation Date/Time: 01/31/2024 7:52 AM  Performed by: Sherrlyn Dolores, CRNAPre-anesthesia Checklist: Patient identified, Patient being monitored, Timeout performed, Emergency Drugs available and Suction available Patient Re-evaluated:Patient Re-evaluated prior to induction Oxygen Delivery Method: Circle system utilized Preoxygenation: Pre-oxygenation with 100% oxygen Induction Type: IV induction Ventilation: Mask ventilation without difficulty Laryngoscope Size: 4 and McGrath Grade View: Grade I Tube type: Oral Tube size: 7.5 mm Number of attempts: 1 Airway Equipment and Method: Stylet Placement Confirmation: ETT inserted through vocal cords under direct vision, positive ETCO2 and breath sounds checked- equal and bilateral Secured at: 21 cm Tube secured with: Tape Dental Injury: Teeth and Oropharynx as per pre-operative assessment  Comments: DL x1 with McGrath MAC 4 blade, grade 1 view. Atraumatic intubation. Dentition unchanged from preop baseline (edentulous).

## 2024-01-31 NOTE — Interval H&P Note (Signed)
 History and Physical Interval Note:  01/31/2024 7:30 AM  Ryan Hensley  has presented today for surgery, with the diagnosis of Prostate Cancer.  The various methods of treatment have been discussed with the patient and family. After consideration of risks, benefits and other options for treatment, the patient has consented to  Procedure(s): PROSTATECTOMY, RADICAL, ROBOT-ASSISTED, LAPAROSCOPIC (N/A) LYMPHADENECTOMY, PELVIS (Bilateral) as a surgical intervention.  The patient's history has been reviewed, patient examined, no change in status, stable for surgery.  I have reviewed the patient's chart and labs.  Questions were answered to the patient's satisfaction.    RRR CTAB   Dustin Gimenez

## 2024-01-31 NOTE — Progress Notes (Signed)
 Pt arrived in PACU from OR with Bilateral SCD sleeves on.  PACU has no SCD machines.  Portable equipment has been notified.

## 2024-01-31 NOTE — Anesthesia Preprocedure Evaluation (Signed)
 Anesthesia Evaluation  Patient identified by MRN, date of birth, ID band Patient awake    Reviewed: Allergy & Precautions, H&P , NPO status , Patient's Chart, lab work & pertinent test results, reviewed documented beta blocker date and time   Airway Mallampati: II  TM Distance: >3 FB Neck ROM: full    Dental  (+) Teeth Intact   Pulmonary shortness of breath, Current Smoker   Pulmonary exam normal        Cardiovascular Exercise Tolerance: Good negative cardio ROS Normal cardiovascular exam Rhythm:regular Rate:Normal     Neuro/Psych  Neuromuscular disease  negative psych ROS   GI/Hepatic Neg liver ROS,GERD  Medicated,,  Endo/Other  negative endocrine ROSdiabetes, Well Controlled, Type 1, Insulin  Dependent, Oral Hypoglycemic Agents    Renal/GU negative Renal ROS  negative genitourinary   Musculoskeletal   Abdominal   Peds  Hematology negative hematology ROS (+)   Anesthesia Other Findings Past Medical History: No date: BPH (benign prostatic hyperplasia) No date: Diabetic neuropathy (HCC) No date: Diabetic retinopathy (HCC) No date: DM (diabetes mellitus), type 2 (HCC) No date: Dyspnea No date: Elevated PSA No date: GERD (gastroesophageal reflux disease) No date: Grade I diastolic dysfunction No date: Hyperlipidemia 1978: Left eye injury, sequela 02/2023: Prostate cancer (HCC) No date: Tobacco abuse Past Surgical History: 2016: boxer fracture ORIF; Right 2007: EYE SURGERY     Comment:  Strabismus surgery, left eye.  Age 53 yo. BMI    Body Mass Index: 24.68 kg/m     Reproductive/Obstetrics negative OB ROS                             Anesthesia Physical Anesthesia Plan  ASA: 2  Anesthesia Plan: General ETT   Post-op Pain Management:    Induction:   PONV Risk Score and Plan: 2  Airway Management Planned:   Additional Equipment:   Intra-op Plan:   Post-operative Plan:    Informed Consent: I have reviewed the patients History and Physical, chart, labs and discussed the procedure including the risks, benefits and alternatives for the proposed anesthesia with the patient or authorized representative who has indicated his/her understanding and acceptance.     Dental Advisory Given  Plan Discussed with: CRNA  Anesthesia Plan Comments:        Anesthesia Quick Evaluation

## 2024-01-31 NOTE — Op Note (Signed)
 01/31/24  PREOPERATIVE DIAGNOSIS: Prostate cancer.  POSTOPERATIVE DIAGNOSIS: Prostate cancer.  OPERATION PERFORMED: 1. DaVinci laparoscopic radical prostatectomy (non nerve sparing) 2 DaVinci laproscopic bilateral pelvic lymph node dissection.  SURGEON: Dustin Gimenez, MD  ASSISTANTS: Jay Meth, MD  ANESTHESIA: General.  EBL: 25 cc  SPECIMEN: Prostate with bilateral seminal vesicals, bilateral pelvic lymph nodes, anterior fat pad.   INDICATION: Pt.is a 53 year old male with Gleason 4+3 prostate cancer. Treatment options were discussed with him at length and he chose DaVinci radical prostatectomy.  Very poor baseline erections therefore plan for non nerve sparing was discussed. Bilateral pelvic lymph node dissection was planned due to his risk stratification.  PROCEDURE IN DETAIL: Patient was given appropriate perioperative antibiotics. He had sequential compression devices applied preoperatively for DVT prophylaxis. He was taken to the operating room where he was induced with general anesthesia. After adequate anesthesia, he was placed in the dorsal lithotomy position. His arms were draped by his side and was appropriately padded and secured to the operating room table. He was placed in the Trendelenburg position.  He was prepped and draped in sterile fashion. An 74 French Foley was placed in the bladder and instilled with 15 cc sterile water. Orogastric tube was placed. The Veress needle was passed just above the umbilicus and the abdomen was insufflated to 15 atmospheres. An 8 mm, blunt-tip trocar was placed just above the umbilicus. The zero-degree camera was passed within this and the following trocars were placed under direct vision; 8 mm robotic trocars were placed 9 cm laterally and inferiorly to the initially placed umbilical trocar. A third one was placed 7 cm lateral to the left-sided trocar. In the corresponding position on the right side, a 12 mm  trocar was placed, and then a 5 mm trocar was placed to the right and well above the umbilicus.  The 12 mm assistant port site was preclosed using 0 Vicryl suture using a Carter-Thomason which was tied down at the end of the case to close this port site.  The robot was then docked with the robot trocar. I used the zero-degree camera. I had the hot scissors in the right hand and the left hand with the [place]  bipolar and far left hand the Prograsp forceps.  His sigmoid colon was somewhat adhesed to the right lateral pelvic wall which was mobilized sufficiently.  Initially I divided the median umbilical ligament bilaterally and the urachus and developed the space of Retzius down to pubic bone. I divided the parietal peritoneum laterally up to the vas deferens on each side. I used the prograsp forceps to provide cranial traction on the urachus. I cleaned off the Endopelvic fascia on each side and then divided it with the scissors laterally to the perirectal fat and medially to the puboprostatic ligaments which were divided. I then ligated the dorsal vein complex using a 60 mm vascular load stapler.  Notably, the muscle was relatively stuck at the apex.    I then addressed the bladder neck with a 30-degree down lens. I identified the bladder neck by pulling on the Foley catheter. I divided the anterior bladder neck musculature until I then found the anterior bladder neck mucosa which was incised. I identified the Foley catheter within, deflated the balloon, pulled the Foley out through this opening and then using the Carter-Thomason needle with a #0-Vicryl suture, passed through The suprapubic region and pulled the suture through the eye of the Foley and then back out. This allowed me to provide upward traction  on the prostate. I then divided the lateral bladder neck mucosa and the posterior bladder neck mucosa. I was well away from ureteral orifices. I divided the posterior bladder neck  musculature until I identified the vas deferens. They were freed proximally, then divided. I freed up the seminal vesicals using blunt and sharp dissection. Judicious use of electrocautery was used near the seminal vesicle tips to avoid injury to neurovascular bundle.   I then went back to the 0-degree lens. I divided the Denonvilliers fascia beneath the prostate and developed the prostate off the rectum. I then did a non nerve sparing by  creating a plane which was intrafascial. I then isolated the pedicles of the prostate and placed weck clips on the pedicles of the prostate and then divided it with cold scissors. I continued to divide the neurovascular bundles off the prostate out to the apex of the prostate. At this point the prostate was freed up except for the urethra. I addressed the prostate anteriorly, divided the dorsal vein , then the anterior urethral wall, pulled the Foley catheter back and then divided the posterior urethral wall. Specimen was completely freed up. I placed the prostate in an Endo catch bag and then placed the bag in the upper abdomen out of the way. I then irrigated the pelvis. There was reasonable hemostasis.  I then did the pelvic lymph node dissection by incising the fascia overlying the right external iliac vein, dissecting distally. I went just distal to the node of [place] where we placed clips and then divided the lymphatics. The lateral aspect of the dissection was the pelvic side wall, inferior was the obturator nerve and proximal the hypogastric vessels. I placed clips at the proximal aspect and then divided the lymphatics. This was removed with the spoon grasper and sent to pathology.   I then did the left obturator lymph node dissection in the same fashion as the left side.  With good hemostasis, I then did the posterior reconstruction. I used a 3-0 VLoc suture through the cut edge of Denonvilliers fascia beneath the bladder on the right side  and through the posterior striated sphincter underneath the urethra. This brought the bladder neck and urethra and closer proximity to help facilitate anastomosis.   I then did the urethral vesicle anastomosis again with two 3-0 VLoc sutures interlocked. I passed both ends of the suture from the outside-in through the bladder neck at the 6 o'clock position. I passed both through the urethral stump from the inside-out in the corresponding position. I reapproximated the bladder neck to the urethra. I then ran the Left suture on the left side anastomosis to the 9 o'clock position. Then I went back to the right sided suture and ran that up the right side to the 12 o'clock position. I then continued the left suture to the 12 o'clock position. The suture was then suspended anteriorly behind the pubic bone.   I then placed a new 18 French Foley into the bladder and filled it with 10 cc sterile water. I irrigated the bladder with 160 cc. There was no leakage. There was reasonable hemostasis.  Surgicel was used on either side of the pedicles for an additional hemostasis.  Surgi-Flo was also used. The instruments were then removed. The robot was undocked and all the trocars were removed under direct vision. There was good hemostasis. I then enlarged the umbilical trocar site large enough to remove the prostate and I closed the fascia here with #0-0 Vicryl suture  in a running fashion. All the port sites were irrigated. Lidocaine was injected into all the trocar sites. The skin was closed with 4-0 Monocryl in running subcuticular fashion. Dermabond was applied.   At this point patient was awakened and extubated in the operating room and taken to the recovery room in stable condition. There were no complications. All counts correct.  An assistant was required for this surgical procedure. The duties of the assistant included but were not limited to suctioning, passing suture, camera  manipulation, retraction. This procedure would not be able to be performed without an Geophysicist/field seismologist.    Dustin Gimenez, MD

## 2024-02-01 ENCOUNTER — Encounter: Payer: Self-pay | Admitting: Urology

## 2024-02-01 DIAGNOSIS — C61 Malignant neoplasm of prostate: Secondary | ICD-10-CM | POA: Diagnosis not present

## 2024-02-01 LAB — CBC
HCT: 37 % — ABNORMAL LOW (ref 39.0–52.0)
Hemoglobin: 12.3 g/dL — ABNORMAL LOW (ref 13.0–17.0)
MCH: 28.3 pg (ref 26.0–34.0)
MCHC: 33.2 g/dL (ref 30.0–36.0)
MCV: 85.1 fL (ref 80.0–100.0)
Platelets: 163 10*3/uL (ref 150–400)
RBC: 4.35 MIL/uL (ref 4.22–5.81)
RDW: 14.4 % (ref 11.5–15.5)
WBC: 14.9 10*3/uL — ABNORMAL HIGH (ref 4.0–10.5)
nRBC: 0 % (ref 0.0–0.2)

## 2024-02-01 LAB — BASIC METABOLIC PANEL WITH GFR
Anion gap: 5 (ref 5–15)
BUN: 10 mg/dL (ref 6–20)
CO2: 25 mmol/L (ref 22–32)
Calcium: 8.8 mg/dL — ABNORMAL LOW (ref 8.9–10.3)
Chloride: 110 mmol/L (ref 98–111)
Creatinine, Ser: 0.93 mg/dL (ref 0.61–1.24)
GFR, Estimated: >60 mL/min (ref >60)
Glucose, Bld: 88 mg/dL (ref 70–99)
Potassium: 3.8 mmol/L (ref 3.5–5.1)
Sodium: 140 mmol/L (ref 135–145)

## 2024-02-01 LAB — GLUCOSE, CAPILLARY
Glucose-Capillary: 102 mg/dL — ABNORMAL HIGH (ref 70–99)
Glucose-Capillary: 151 mg/dL — ABNORMAL HIGH (ref 70–99)
Glucose-Capillary: 79 mg/dL (ref 70–99)

## 2024-02-01 MED ORDER — OXYCODONE-ACETAMINOPHEN 5-325 MG PO TABS: 1.0000 | ORAL_TABLET | Freq: Four times a day (QID) | ORAL | 0 refills | Status: DC | PRN

## 2024-02-01 MED ORDER — OXYBUTYNIN CHLORIDE 5 MG PO TABS
5.0000 mg | ORAL_TABLET | Freq: Three times a day (TID) | ORAL | 0 refills | Status: DC | PRN
Start: 2024-02-01 — End: 2024-03-22

## 2024-02-01 MED ORDER — DOCUSATE SODIUM 100 MG PO CAPS: 100.0000 mg | ORAL_CAPSULE | Freq: Two times a day (BID) | ORAL | 0 refills | Status: DC

## 2024-02-01 NOTE — Plan of Care (Signed)
  Problem: Education: Goal: Knowledge of the procedure and recovery process will improve Outcome: Progressing   Problem: Bowel/Gastric: Goal: Gastrointestinal status for postoperative course will improve Outcome: Progressing   Problem: Pain Management: Goal: General experience of comfort will improve Outcome: Progressing   Problem: Activity: Goal: Risk for activity intolerance will decrease Outcome: Progressing

## 2024-02-01 NOTE — TOC CM/SW Note (Signed)
 Transition of Care [facility]) - Inpatient Brief Assessment   Patient Details  Name: Ryan Hensley MRN: [id number] Date of Birth: Aug 11, 1971  Transition of Care [facility]) CM/SW Contact:    Odilia Bennett, LCSW Phone Number: 02/01/2024, 3:12 PM   Clinical Narrative: Patient has orders to discharge home today. Per RN, patient is requesting a RW. Ordered through [employer]. No other TOC needs identified. CSW signing off.  Transition of Care Asessment: Insurance and Status: Insurance coverage has been reviewed Patient has primary care physician: Yes Home environment has been reviewed: Single family home Prior level of function:: Not documented Prior/Current Home Services: No current home services Social Drivers of Health Review: SDOH reviewed no interventions necessary Readmission risk has been reviewed: Yes Transition of care needs: no transition of care needs at this time

## 2024-02-01 NOTE — Discharge Summary (Signed)
 Date of admission: 01/31/2024  Date of discharge: 02/01/2024  Admission diagnosis: Prostate cancer  Discharge diagnosis: Same as above  Secondary diagnoses:  Patient Active Problem List   Diagnosis Date Noted   Onychomycosis 06/02/2023   Microalbuminuria 06/02/2023   Prostate cancer (HCC) 02/2023   Tobacco abuse disorder 01/28/2023   Exertional dyspnea 01/28/2023   Elevated PSA 10/30/2022   Benign prostatic hyperplasia (BPH) with straining on urination 10/30/2022   Lumbar back pain with radiculopathy affecting right lower extremity 02/25/2022   Food insecurity 02/25/2022   Cutaneous abscess of back (any part, except buttock) 12/19/2018   Gastroesophageal reflux disease 12/13/2018   Mixed hyperlipidemia 12/13/2018   Diabetic peripheral neuropathy (HCC) 12/13/2018   Diabetic retinopathy associated with type 2 diabetes mellitus (HCC) 12/13/2018   Type 2 diabetes mellitus with complication, without long-term current use of insulin  (HCC) 12/13/2018   Left eye injury, sequela 1978   History and Physical: For full details, please see admission history and physical. Briefly, Ryan Hensley is a 53 y.o. year old patient admitted on 01/31/2024 for scheduled robotic assisted laparoscopic radical prostatectomy, nonnerve sparing, with bilateral pelvic lymph node dissection with Dr. Ace Holder for management of prostate cancer.   This morning he reports feeling well with no acute concerns.  His pain is reasonably well-controlled and he is motivated to go home.  Anticipated changes on a.m. labs.  Foley catheter in place draining clear, yellow urine.  As of this afternoon, he has ambulated with nursing and is tolerating p.o. well. He requests a walker for home use, as his lot is h[place].  Physical Exam: Constitutional:  Alert and oriented, no acute distress, nontoxic appearing HEENT: [place], AT Cardiovascular: No clubbing, cyanosis, or edema Respiratory: Normal respiratory effort, no increased work of  breathing GI: Abdomen is soft with appropriate postoperative tenderness, no rigidity or rebound. Incisions clean/dry/intact with overlying surgical adhesive. Skin: No rashes, bruises or suspicious lesions Neurologic: Grossly intact, no focal deficits, moving all 4 extremities Psychiatric: Normal mood and affect   Hospital Course: Patient tolerated the procedure well.  He was then transferred to the floor after an uneventful PACU stay.  His hospital course was uncomplicated.  On POD#1 he had met discharge criteria: was eating a carb modified diet, was up and ambulating independently,  pain was well controlled, catheter was draining well, and was ready for discharge.  Laboratory values:  Recent Labs    02/01/24 0420  WBC 14.9*  HGB 12.3*  HCT 37.0*   Recent Labs    02/01/24 0420  NA 140  K 3.8  CL 110  CO2 25  GLUCOSE 88  BUN 10  CREATININE 0.93  CALCIUM  8.8*   Results for orders placed or performed during the hospital encounter of 01/27/24  Urine Culture     Status: None   Collection Time: 01/27/24  9:54 AM   Specimen: Urine, Clean Catch  Result Value Ref Range Status   Specimen Description   Final    URINE, CLEAN CATCH Performed at [facility], [address] [place], [place] [address]    Special Requests   Final    NONE Performed at [facility], [address] [place], [place] [address]    Culture   Final    NO GROWTH Performed at [facility], [address] [place], [place] [address]    Report Status 01/28/2024 FINAL  Final   Disposition: Home  Discharge instruction: The patient was instructed to be ambulatory but told  to refrain from heavy lifting, strenuous activity, or driving.  Foley care instructions provided by nursing.  Discharge medications:  Allergies as of 02/01/2024       Reactions   Shellfish Allergy Swelling        Medication List     STOP taking these medications    finasteride  5 MG tablet Commonly known  as: Proscar    tamsulosin  0.4 MG Caps capsule Commonly known as: FLOMAX        TAKE these medications    ACCU-CHEK ACTIVE VI 1 Device by In Vitro route in the morning and at bedtime.   AgaMatrix Presto Test test strip Generic drug: glucose blood Check blood blood sugar twice daily before meals   AgaMatrix Presto w/Device Kit Check blood sugars twice daily before meals   AgaMatrix Ultra-Thin Lancets Misc Check blood glucose twice daily before meals   aspirin  EC 81 MG tablet 1 tab by mouth daily with morning meal   atorvastatin  80 MG tablet Commonly known as: LIPITOR TAKE 1 TABLET BY MOUTH WITH EVENING MEAL   buPROPion  150 MG 12 hr tablet Commonly known as: WELLBUTRIN  SR 1 tab by mouth in morning daily for 3 days then increase to 1 tab by mouth twice daily on day 4 What changed:  how much to take how to take this when to take this additional instructions   docusate sodium 100 MG capsule Commonly known as: COLACE Take 1 capsule (100 mg total) by mouth 2 (two) times daily.   empagliflozin  10 MG Tabs tablet Commonly known as: Jardiance  Take 1 tablet (10 mg total) by mouth daily before breakfast.   famotidine  20 MG tablet Commonly known as: PEPCID  TAKE 2 TABLETS BY MOUTH AT BEDTIME   gabapentin  100 MG capsule Commonly known as: NEURONTIN  CONTINUE 3 CAPSULES AT NIGHT AND ADD 1 CAPSULE IN THE MORNING AND INCREASE BY 1 CAPSULE EVERY 3 DAYS UNTIL TAKING 3 CAPSULES 3 TIMES DAILY   glipiZIDE  5 MG tablet Commonly known as: GLUCOTROL  TAKE 1 TABLET BY MOUTH TWICE DAILY WITH MEALS   ibuprofen  400 MG tablet Commonly known as: ADVIL  Take 1 tablet (400 mg total) by mouth every 8 (eight) hours as needed for moderate pain.   Lantus  SoloStar 100 UNIT/ML Solostar Pen Generic drug: insulin  glargine INJECT 15 UNITS SUBCUTANEOUS IN THE MORNING BEFORE BREAKFAST   metFORMIN  1000 MG tablet Commonly known as: GLUCOPHAGE  TAKE 1 TABLET BY MOUTH TWICE DAILY WITH A MEAL    metoCLOPramide  5 MG tablet Commonly known as: REGLAN  1 tab by mouth before meal 3 times daily.   nicotine  polacrilex 2 MG gum Commonly known as: NICORETTE  Take 1 each (2 mg total) by mouth as needed for smoking cessation.   oxybutynin 5 MG tablet Commonly known as: DITROPAN Take 1 tablet (5 mg total) by mouth every 8 (eight) hours as needed for bladder spasms.   oxyCODONE-acetaminophen  5-325 MG tablet Commonly known as: PERCOCET/ROXICET Take 1-2 tablets by mouth every 6 (six) hours as needed for moderate pain (pain score 4-6).   Ozempic  (0.25 or 0.5 MG/DOSE) 2 MG/3ML Sopn Generic drug: Semaglutide (0.25 or 0.5MG /DOS) Inject 0.5 mg subcutaneously once weekly   sildenafil  20 MG tablet Commonly known as: Revatio  Take 1 to 5 tablets 1 hour prior to intercourse as needed   vitamin B-12 50 MCG tablet Commonly known as: CYANOCOBALAMIN Take 50 mcg by mouth daily.        Followup:   Follow-up Information     Kathreen Pare, PA-C Follow up on 02/07/2024.  Specialty: Urology Why: For catheter removal Contact information: [address] [place] [address] [phone]

## 2024-02-02 ENCOUNTER — Encounter: Payer: Self-pay | Admitting: Urology

## 2024-02-02 NOTE — Anesthesia Postprocedure Evaluation (Signed)
 Anesthesia Post Note  Patient: Ryan Hensley  Procedure(s) Performed: PROSTATECTOMY, RADICAL, ROBOT-ASSISTED, LAPAROSCOPIC LYMPHADENECTOMY, PELVIS (Bilateral)  Patient location during evaluation: PACU Anesthesia Type: General Level of consciousness: awake and alert Pain management: pain level controlled Vital Signs Assessment: post-procedure vital signs reviewed and stable Respiratory status: spontaneous breathing, nonlabored ventilation, respiratory function stable and patient connected to nasal cannula oxygen Cardiovascular status: blood pressure returned to baseline and stable Postop Assessment: no apparent nausea or vomiting Anesthetic complications: no   No notable events documented.   Last Vitals:  Vitals:   02/01/24 0834 02/01/24 1540  BP: 137/76 138/84  Pulse: 73 81  Resp: 14 14  Temp: 37.4 C 37.8 C  SpO2: 98% 100%    Last Pain:  Vitals:   02/01/24 0900  TempSrc:   PainSc: 10-Worst pain ever                 Zula Hitch

## 2024-02-04 ENCOUNTER — Ambulatory Visit: Payer: Self-pay | Admitting: Urology

## 2024-02-04 LAB — SURGICAL PATHOLOGY

## 2024-02-06 ENCOUNTER — Encounter: Payer: Self-pay | Admitting: Internal Medicine

## 2024-02-07 ENCOUNTER — Ambulatory Visit (INDEPENDENT_AMBULATORY_CARE_PROVIDER_SITE_OTHER): Admitting: Physician Assistant

## 2024-02-07 VITALS — BP 94/61 | HR 80 | Ht 70.0 in | Wt 174.0 lb

## 2024-02-07 DIAGNOSIS — C61 Malignant neoplasm of prostate: Secondary | ICD-10-CM

## 2024-02-07 MED ORDER — SULFAMETHOXAZOLE-TRIMETHOPRIM 800-160 MG PO TABS
1.0000 | ORAL_TABLET | Freq: Once | ORAL | Status: AC
  Administered 2024-02-07: 1 via ORAL

## 2024-02-07 NOTE — Progress Notes (Signed)
 Catheter Removal  Patient is present today for a catheter removal.  10ml of water was drained from the balloon. A 18FR foley cath was removed from the bladder, no complications were noted. Patient tolerated well.  Performed by: Lucie Hones, PA-C   Additional notes: Patient denies LE edema, redness, or pain; chest pain; shortness of breath; palpitations. He is having BMs and continues to tolerate PO. Abdomen is soft without rigidity or rebound. Incisions are clean, dry, and intact.  Bactrim DS x1 dose administered prior to Foley removal for UTI prevention.  Surgical pathology results shared with patient. We discussed negative nodes and focal positive margin. We discussed the role of postop PSA to determine need for adjuvant therapy.  We discussed anticipated postop urinary leakage. I specifically counseled him to anticipate leakage, wear absorbant products as needed for security, and that his first year postop will show the greatest improvement in leakage.   Follow up: Return in about 6 weeks (around 03/20/2024) for Postop f/u with PSA prior with Dr. Sninsky.

## 2024-02-07 NOTE — Addendum Note (Signed)
 Addended by: EFFIE POWELL DEL on: 02/07/2024 10:06 AM   Modules accepted: Orders

## 2024-02-07 NOTE — Patient Instructions (Signed)
 You can purchase incontinence supplies (pull up absorbant underwear, pads, liner, shields, etc.) at any of the following: -[employer] -[employer] -Pharmacies -Online

## 2024-02-08 ENCOUNTER — Telehealth: Payer: Self-pay | Admitting: Internal Medicine

## 2024-02-08 NOTE — Telephone Encounter (Signed)
 Patient called stated that he recently had surgery done and has stitches all over stomach. Patient reports he needs to inject insulin  but due to the stitches patient has pain and can not inject insulin  , patient would like to know what he can do .

## 2024-02-09 NOTE — Telephone Encounter (Signed)
 Patient agreed.

## 2024-02-09 NOTE — Telephone Encounter (Signed)
 Patient has been notified to inject himself in soft tissue of lateral thighs for now.

## 2024-02-11 NOTE — Telephone Encounter (Signed)
 Spoke with patient. Patient had prostatectomy on 01/31/2024. Patient started having pain in the right lower part of his abdomen, where his stitches are, 2 days ago. It is a constant aching sensation in that area whether he is resting or moving around, when he has to cough the pain becomes severe to the point of doubling over and stabbing, otherwise it is just constant achy/some stabbing sensation. He has not tried any medications for this pain. He ran out of pain medication from surgery last week. He is taking Oxybutinin 1 every 8 hours. The tip of his penis is still irritated from the catheter. He is urinating well but when he urinates he gets a pressure sensation in his lower back, no pressure sensation or any issues with urination otherwise, other than incontinence as he expected. Patient would like to know what he can do about the side pain and any other recommendations. His bowel movements have been regular for him. No fever, vomiting or nausea.

## 2024-03-02 ENCOUNTER — Other Ambulatory Visit

## 2024-03-03 ENCOUNTER — Other Ambulatory Visit

## 2024-03-03 DIAGNOSIS — C61 Malignant neoplasm of prostate: Secondary | ICD-10-CM

## 2024-03-04 LAB — PSA: Prostate Specific Ag, Serum: <0.1 ng/mL (ref 0.0–4.0)

## 2024-03-10 ENCOUNTER — Other Ambulatory Visit

## 2024-03-10 DIAGNOSIS — E118 Type 2 diabetes mellitus with unspecified complications: Secondary | ICD-10-CM | POA: Diagnosis not present

## 2024-03-11 LAB — HEMOGLOBIN A1C
Est. average glucose Bld gHb Est-mCnc: 146 mg/dL
Hgb A1c MFr Bld: 6.7 % — ABNORMAL HIGH (ref 4.8–5.6)

## 2024-03-22 ENCOUNTER — Ambulatory Visit (INDEPENDENT_AMBULATORY_CARE_PROVIDER_SITE_OTHER): Admitting: Urology

## 2024-03-22 VITALS — BP 109/63 | HR 89 | Ht 70.0 in | Wt 174.0 lb

## 2024-03-22 DIAGNOSIS — C61 Malignant neoplasm of prostate: Secondary | ICD-10-CM

## 2024-03-22 DIAGNOSIS — N528 Other male erectile dysfunction: Secondary | ICD-10-CM

## 2024-03-22 MED ORDER — SILDENAFIL CITRATE 20 MG PO TABS: ORAL_TABLET | ORAL | 11 refills | Status: DC

## 2024-03-22 NOTE — Progress Notes (Signed)
   03/22/2024 9:43 AM   Nancyann Daring 18-Mar-1971 [id number]  Reason for visit: Follow up prostate cancer  HPI: 53 year old male who underwent robotic radical prostatectomy with Dr. Penne on 01/31/2024 with pathology showing Gleason score 4+5 = 9 prostate adenocarcinoma, intraductal carcinoma was present, cribriform glands present, extraprostatic extension present, and focally positive cauterized margin at the left apex.  His medical history is notable for diabetes, control improved over the last 18 months, recent hemoglobin A1c 6.7.  Continues to smoke.  He had a fair amount of urinary symptoms prior to surgery and was on Flomax  and finasteride , also used sildenafil  for ED prior.  We reviewed his focally positive margin, as well as his undetectable PSA.  We discussed the high risk of recurrence based on his pathology results, and importance of close monitoring of the PSA, potential need for salvage radiation in the future.  He had a number of questions today.  He continues to have incontinence requiring multiple pads a day, he does think this has improved over the last few weeks.  He denies any dysuria or gross hematuria.  He also has not had any erections since surgery as expected.  Unfortunately, he did not have a good understanding of expected ED postop and had a number of concerns about erections today as well as potential for further pregnancies.  Long conversation had today regarding his known nerve-sparing radical prostatectomy with his high risk disease in young age, and high risk for persistent ED despite PDE 5 inhibitors.  Could consider penile injections in the future, or referral to Dr. Lovie to consider penile prosthesis or even sling/AUS if needed at that time.  RTC 3 months PSA prior Referral placed to pelvic floor physical therapy Sildenafil  100 mg on demand for ED   Redell JAYSON Burnet, MD  [facility] [address], [address] [place], [place] [address] [phone]

## 2024-03-22 NOTE — Addendum Note (Signed)
 Addended by: Porter Moes M on: 03/22/2024 10:21 AM   Modules accepted: Orders

## 2024-03-27 ENCOUNTER — Encounter: Payer: Self-pay | Admitting: Physical Therapy

## 2024-03-27 ENCOUNTER — Ambulatory Visit: Attending: Urology | Admitting: Physical Therapy

## 2024-03-27 DIAGNOSIS — M533 Sacrococcygeal disorders, not elsewhere classified: Secondary | ICD-10-CM | POA: Diagnosis present

## 2024-03-27 DIAGNOSIS — C61 Malignant neoplasm of prostate: Secondary | ICD-10-CM | POA: Diagnosis not present

## 2024-03-27 DIAGNOSIS — R279 Unspecified lack of coordination: Secondary | ICD-10-CM | POA: Insufficient documentation

## 2024-03-27 DIAGNOSIS — R2689 Other abnormalities of gait and mobility: Secondary | ICD-10-CM | POA: Insufficient documentation

## 2024-03-27 NOTE — Patient Instructions (Addendum)
 Decrease time in recliner   Do not lean on R side of recline   Sit in normal chair for watching TV Can use the pillow temporary to sit on   Take  breaks from sitting every 45 min  -Walking in pace 2 min    - Minisquat: Scoot buttocks back slight, hinge like you are looking at your reflection on a pond  Knees behind toes,  Inhale to smell flowers  Exhale on the rise like rocket  Do not lock knees, have more weight across ballmounds of feet, toes relaxed and spread them, not grip them   10 reps x 3 x day     Avoid straining pelvic floor, abdominal muscles , spine  Use log rolling technique instead of getting out of bed with your neck or the sit-up  Log rolling into and out of bed Log rolling into and out of bed If getting out of bed on R side, Bent knees, scoot hips/ shoulder to L  Raise R arm completely overhead, rolling onto armpit  Then lower bent knees to bed to get into complete side lying position  Then drop legs off bed, and push up onto R elbow/forearm, and use L hand to push onto the bed __ Proper body mechanics with getting out of a chair to decrease strain  on back &pelvic floor   Avoid holding your breath when Getting out of the chair:  Scoot to front part of chair chair Heels behind knees, feet are hip width apart, nose over toes  Inhale like you are smelling roses Exhale to stand  ___  Sitting with feet on ground, four points of contact Catch yourself crossing ankles and thighs

## 2024-03-27 NOTE — Therapy (Signed)
 OUTPATIENT PHYSICAL THERAPY EVALUATION   Patient Name: Ryan Hensley MRN: [id number] DOB:Jan 16, 1971, 53 y.o., male Today's Date: 03/27/2024   PT End of Session - 03/27/24 1111     Visit Number 1    Number of Visits 10    Date for PT Re-Evaluation 06/05/24    PT Start Time 1108    PT Stop Time 1148    PT Time Calculation (min) 40 min          Past Medical History:  Diagnosis Date   BPH (benign prostatic hyperplasia)    Diabetic neuropathy (HCC)    Diabetic retinopathy (HCC)    DM (diabetes mellitus), type 2 (HCC)    Dyspnea    Elevated PSA    GERD (gastroesophageal reflux disease)    Grade I diastolic dysfunction    Hyperlipidemia    Left eye injury, sequela 1978   Prostate cancer (HCC) 02/2023   Tobacco abuse    Past Surgical History:  Procedure Laterality Date   boxer fracture ORIF Right 2016   EYE SURGERY  2007   Strabismus surgery, left eye.  Age 9 yo.   PELVIC LYMPH NODE DISSECTION Bilateral 01/31/2024   Procedure: LYMPHADENECTOMY, PELVIS;  Surgeon: Penne Knee, MD;  Location: [facility];  Service: Urology;  Laterality: Bilateral;   ROBOT ASSISTED LAPAROSCOPIC RADICAL PROSTATECTOMY N/A 01/31/2024   Procedure: PROSTATECTOMY, RADICAL, ROBOT-ASSISTED, LAPAROSCOPIC;  Surgeon: Penne Knee, MD;  Location: [facility];  Service: Urology;  Laterality: N/A;   Patient Active Problem List   Diagnosis Date Noted   Onychomycosis 06/02/2023   Microalbuminuria 06/02/2023   Prostate cancer (HCC) 02/2023   Tobacco abuse disorder 01/28/2023   Exertional dyspnea 01/28/2023   Elevated PSA 10/30/2022   Benign prostatic hyperplasia (BPH) with straining on urination 10/30/2022   Lumbar back pain with radiculopathy affecting right lower extremity 02/25/2022   Food insecurity 02/25/2022   Cutaneous abscess of back (any part, except buttock) 12/19/2018   Gastroesophageal reflux disease 12/13/2018   Mixed hyperlipidemia 12/13/2018   Diabetic peripheral neuropathy (HCC)  12/13/2018   Diabetic retinopathy associated with type 2 diabetes mellitus (HCC) 12/13/2018   Type 2 diabetes mellitus with complication, without long-term current use of insulin  (HCC) 12/13/2018   Left eye injury, sequela 1978    PCP: Adella   REFERRING PROVIDER: Leocadia MART DIAG: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 Leakage with coughing . Pt sometimes sense urge to urination and sometimes he does not    2) LBP: pt has LBP for years prior to surgery. Pt points to both sides at SIJ area and it does not radiate down the leg. R hip is worse. Pt is about to schedule hip surgery.  LBP is occur all day , walking, stairs,  Pt is using scooter when grocery shopping. Pt wants to get a handicap sticker but he has not gotten one from his MD. Pt had pain walking parking lot to [employer]  10+ /10 pain    Pt sits on a recliner all day. Pt is in a recliner starting 5:48 am to 10pm unless he is going to the store. Pt leans on the R side of his recliner      PERTINENT HISTORY:    PAIN:  Are you having pain? Yes: see above   PRECAUTIONS: no lifting > 10 lbs   WEIGHT BEARING RESTRICTIONS:   No   FALLS:  Has patient fallen in last 6 months?  No   LIVING ENVIRONMENT: Lives with: brother  Lives in: One story  Stairs: 15 STE with rail and a hill .   I am scared  of his house because the stairs look like they are about to fall off the house  Assistance Device at home:  WC, crutches, SPC   OCCUPATION: not working, on disability   PLOF: IND   PATIENT GOALS:   Get everything working to where it was     OBJECTIVE:    [facility] PT Assessment - 03/27/24 1113       Observation/Other Assessments   Observations discomfort with sitting, pt leans on R hip  due to pain      Palpation   Spinal mobility LBP with rotation and sideflexion B    SI assessment  R shoulder and iliac crest lowered      Ambulation/Gait   Gait Comments 0.96 m/s , pelvic sway to R, limited posterior rotatin of L pelvis, R thorax             Self-Care   Self-Care Other Self-Care Comments    Other Self-Care Comments  explained regional interdependent approach to address pain at different body parts, explained upcoming visits to realign posture and spine.pelvis to improve Sx and achieve goals, showed anatomy images of deep core system  ,      Therapeutic Activites    Therapeutic Activities Other Therapeutic Activities    Other Therapeutic Activities future sessions to help realign pelvis and spine and to learn co-activation of deep core system when performing against gravity tasks . These improvements will help address pain and Sx  Encouraged less time in recliner and its implications to his poor posture. Advised pt to practice standing marching and squats, proper sit to stand,     Neuro Re-ed    Neuro Re-ed Details  cued for alignment and proprioception of pelvis and LKC and spine in  sit to stand and for proper sitting posture to activate deep core system            HOME EXERCISE PROGRAM: See pt instruction section    ASSESSMENT:  CLINICAL IMPRESSION: Pt is a 53  yo  who presents  with  following issues which impact QOL, ADL,  fitness, social and community activities:   1) Urinary leakage after RALP surgery  2) LBP   Pt's musculoskeletal assessment  revealed uneven pelvic girdle and shoulder height, asymmetries to gait pattern, limited spinal /pelvic mobility, dyscoordination and strength of pelvic floor mm, hip weakness, poor body mechanics which places strain on the abdominal/pelvic floor mm. These are deficits that indicate an ineffective intraabdominal pressure system associated with increased risk for pt's Sx.    Pt was provided education on etiology of Sx with anatomy, physiology explanation with images along with the benefits of customized pelvic PT Tx based on pt's medical conditions and musculoskeletal deficits.  Explained the physiology of deep core mm coordination and roles of pelvic floor function in urination, defecation, sexual function, and postural control with deep core mm system, and the role of posture and alignment to help pelvic issues.    Regional interdependent approaches will yield greater benefits in pt's POC.   Following Tx today which pt tolerated without complaints,  pt demo'd proper body mechanics to minimize straining pelvic floor.    Encouraged less time in recliner and its implications to his poor posture and to minimize leaning on his R to recliner armrest.  Advised sitting at dining room chair instead.  Advised pt to practice standing marching and squats, proper sit to stand, Plan to address realignment of spine/ pelvis at next session to help promote optimize IAP system for improved pelvic floor function, trunk stability, gait, balance, stabilization with mobility tasks.  Plan to address pelvic floor issues once pelvis and spine are realigned to yield better outcomes.                                                       Pt benefits from skilled PT.    OBJECTIVE IMPAIRMENTS decreased activity tolerance, decreased coordination, decreased endurance, decreased mobility, difficulty walking, decreased ROM, decreased strength, decreased safety awareness, hypomobility, increased muscle spasms, impaired flexibility,  improper body mechanics, postural dysfunction, and pain. scar restrictions   ACTIVITY LIMITATIONS  self-care,  sleep, home chores, work tasks    PARTICIPATION LIMITATIONS:  community, gym activities    PERSONAL FACTORS   affecting patient's functional outcome:    REHAB POTENTIAL: Good   CLINICAL DECISION MAKING: Evolving/moderate complexity   EVALUATION COMPLEXITY: Moderate    PATIENT EDUCATION:    Education details: Showed pt anatomy images. Explained muscles attachments/ connection, physiology of deep core system/ spinal- thoracic-pelvis-lower kinetic chain as they relate to pt's presentation, Sx, and past Hx. Explained what and how these areas of deficits need to be restored to balance and function    See Therapeutic activity / neuromuscular re-education section  Answered pt's questions.   Person educated: Patient Education method: Explanation, Demonstration, Tactile cues, Verbal cues, and Handouts Education comprehension: verbalized understanding, returned demonstration, verbal cues required, tactile cues required, and needs further education     PLAN: PT FREQUENCY: 1x/week   PT DURATION: 10 weeks   PLANNED INTERVENTIONS:   Gait training;Stair training;Functional mobility training;DME Instruction;Therapeutic activities;Therapeutic exercise;Balance training;Neuromuscular re-education;Patient/family education;Vestibular;Visual/perceptual remediation/compensation;Passive range of motion;Moist Heat;Cryotherapy;Traction;Canalith Repostioning;Joint Manipulations;Manual lymph drainage;Manual techniques;Scar mobilization;Energy conservation;Dry needling;ADLs/Self Care Home Management;Biofeedback;Electrical Stimulation;Taping    PLAN FOR NEXT SESSION: See clinical impression for plan     GOALS: Goals reviewed with patient? Yes  SHORT  TERM GOALS: Target date: 04/24/2024    Pt will demo IND with HEP                    Baseline: Not IND            Goal status:  INITIAL   LONG TERM GOALS: Target date: 06/05/2024    1.Pt will demo proper deep core coordination without chest breathing and optimal excursion of diaphragm/pelvic floor in order to promote spinal stability and pelvic floor function  Baseline: dyscoordination Goal status: INITIAL  2.  Pt will demo proper body mechanics in against gravity tasks and ADLs  work tasks, fitness  to minimize straining pelvic floor / back    Baseline: not IND, improper form that places strain on pelvic floor  Goal status: INITIAL    3. Pt will demo increased gait speed > 1.3 m/s with reciprocal gait pattern, longer stride length  in order to ambulate safely in community and return to fitness routine  Baseline: 0.96 m/s , pelvic sway to R, limited posterior rotatin of L pelvis, R thorax  Goal status: INITIAL    4. Pt will demo levelled pelvic girdle and shoulder height in order to progress to deep core strengthening HEP and restore mobility at spine, pelvis, gait, posture minimize falls, and improve balance  Baseline: R shoulder and iliac crest lowered  Goal status: INITIAL   5. Pt will decrease wearing pull ups to < 4 a day as a sign of continence improvement   Baseline: 5 Pull ups a day  Goal status: INITIAL  6. Pt will be report decreased time in recliner all day by 50% to increase mobility and improve posture Baseline: Pt is in a recliner starting 5:48 am to 10pm unless he is going to the store. Pt leans on the R side of his recliner  Goal status: INITIAL    Pia Lupe Plump, PT 03/27/2024, 11:18 AM

## 2024-03-29 ENCOUNTER — Other Ambulatory Visit: Payer: Self-pay

## 2024-03-29 ENCOUNTER — Other Ambulatory Visit: Payer: Self-pay | Admitting: Internal Medicine

## 2024-03-31 ENCOUNTER — Other Ambulatory Visit: Payer: Self-pay

## 2024-03-31 MED ORDER — METFORMIN HCL 1000 MG PO TABS: 1000.0000 mg | ORAL_TABLET | Freq: Two times a day (BID) | ORAL | 2 refills | Status: DC

## 2024-03-31 MED ORDER — ATORVASTATIN CALCIUM 80 MG PO TABS: ORAL_TABLET | ORAL | 2 refills | Status: DC

## 2024-03-31 MED ORDER — LANTUS SOLOSTAR 100 UNIT/ML ~~LOC~~ SOPN: PEN_INJECTOR | SUBCUTANEOUS | 11 refills | Status: DC

## 2024-03-31 MED ORDER — GABAPENTIN 100 MG PO CAPS: ORAL_CAPSULE | ORAL | 11 refills | Status: DC

## 2024-03-31 MED ORDER — GLIPIZIDE 5 MG PO TABS
ORAL_TABLET | ORAL | 2 refills | Status: DC
Start: 2024-03-31 — End: 2024-06-07

## 2024-04-04 ENCOUNTER — Ambulatory Visit: Admitting: Physical Therapy

## 2024-04-05 ENCOUNTER — Ambulatory Visit: Admitting: Physical Therapy

## 2024-04-05 DIAGNOSIS — R279 Unspecified lack of coordination: Secondary | ICD-10-CM

## 2024-04-05 DIAGNOSIS — M533 Sacrococcygeal disorders, not elsewhere classified: Secondary | ICD-10-CM | POA: Diagnosis not present

## 2024-04-05 DIAGNOSIS — R2689 Other abnormalities of gait and mobility: Secondary | ICD-10-CM

## 2024-04-05 NOTE — Therapy (Signed)
 OUTPATIENT PHYSICAL THERAPY TREATMENT    Patient Name: Ryan Hensley MRN: [id number] DOB:Dec 19, 1970, 53 y.o., male Today's Date: 04/05/2024   PT End of Session - 04/05/24 1425     Visit Number 2    Number of Visits 10    Date for PT Re-Evaluation 06/05/24    PT Start Time 1422    PT Stop Time 1500    PT Time Calculation (min) 38 min          Past Medical History:  Diagnosis Date   BPH (benign prostatic hyperplasia)    Diabetic neuropathy (HCC)    Diabetic retinopathy (HCC)    DM (diabetes mellitus), type 2 (HCC)    Dyspnea    Elevated PSA    GERD (gastroesophageal reflux disease)    Grade I diastolic dysfunction    Hyperlipidemia    Left eye injury, sequela 1978   Prostate cancer (HCC) 02/2023   Tobacco abuse    Past Surgical History:  Procedure Laterality Date   boxer fracture ORIF Right 2016   EYE SURGERY  2007   Strabismus surgery, left eye.  Age 16 yo.   PELVIC LYMPH NODE DISSECTION Bilateral 01/31/2024   Procedure: LYMPHADENECTOMY, PELVIS;  Surgeon: Penne Knee, MD;  Location: [facility];  Service: Urology;  Laterality: Bilateral;   ROBOT ASSISTED LAPAROSCOPIC RADICAL PROSTATECTOMY N/A 01/31/2024   Procedure: PROSTATECTOMY, RADICAL, ROBOT-ASSISTED, LAPAROSCOPIC;  Surgeon: Penne Knee, MD;  Location: [facility];  Service: Urology;  Laterality: N/A;   Patient Active Problem List   Diagnosis Date Noted   Onychomycosis 06/02/2023   Microalbuminuria 06/02/2023   Prostate cancer (HCC) 02/2023   Tobacco abuse disorder 01/28/2023   Exertional dyspnea 01/28/2023   Elevated PSA 10/30/2022   Benign prostatic hyperplasia (BPH) with straining on urination 10/30/2022   Lumbar back pain with radiculopathy affecting right lower extremity 02/25/2022   Food insecurity 02/25/2022   Cutaneous abscess of back (any part, except buttock) 12/19/2018   Gastroesophageal reflux disease 12/13/2018   Mixed hyperlipidemia 12/13/2018   Diabetic peripheral neuropathy (HCC)  12/13/2018   Diabetic retinopathy associated with type 2 diabetes mellitus (HCC) 12/13/2018   Type 2 diabetes mellitus with complication, without long-term current use of insulin  (HCC) 12/13/2018   Left eye injury, sequela 1978    PCP: Adella   REFERRING PROVIDER: Leocadia MART DIAG: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 Pt is sitting in an office chair to have better posture when watching TV . Pt has to sit on a pillow every where he goes.  SUBJECTIVE STATEMENT ON  EVAL  03/27/24   1) Urinary leakage after RALP surgery:  leakage first caused pt to wear 10 Pull ups a day but now he is down to 5 Pull ups a day  Sometimes he does not feel the leakage occurring. Leakage with coughing . Pt sometimes sense urge to urination and sometimes he does not    2) LBP: pt has LBP for years prior to surgery. Pt points to both sides at SIJ area and it does not radiate down the leg. R hip is worse. Pt is about to schedule hip surgery.  LBP is occur all day , walking, stairs,  Pt is using scooter when grocery shopping. Pt wants to get a handicap sticker but he has not gotten one from his MD. Pt had pain walking parking lot to [employer]  10+ /10 pain    Pt sits on a recliner all day. Pt is in a recliner starting 5:48 am to 10pm unless he is going to the store. Pt leans on the R side of his recliner      PERTINENT HISTORY:    PAIN:  Are you having pain? Yes: see above   PRECAUTIONS: no lifting > 10 lbs   WEIGHT BEARING  RESTRICTIONS:   No   FALLS:  Has patient fallen in last 6 months?  No   LIVING ENVIRONMENT: Lives with: brother  Lives in: One story  Stairs: 15 STE with rail and a hill .   I am scared of his house because the stairs look like they are about to fall off the house  Assistance Device at home:  WC, crutches, SPC   OCCUPATION: not working, on disability   PLOF: IND   PATIENT GOALS:   Get everything working to where it was     OBJECTIVE:    [facility] PT Assessment - 04/05/24 1431       Observation/Other Assessments   Observations slumped sitting      Palpation   Spinal mobility LBP with L rotation and B sideflexion    SI assessment  level pelvis , R shoulder lowered    Palpation comment hypomobile deviated T10 to the L,   tenderness/ hypombile SIJ B,, flexed coccyx          [facility] Adult PT Treatment/Exercise - 04/05/24 1500       Therapeutic Activites    Other Therapeutic Activities guided pt to elveated legs propped with support to minimize loading on pelvic floor and relief LBP   Explained how to use adjustable bed for this anti gravity position for pain relief   Explained and showed anatomy for multifidis mm to  help with improved posture / pelvic tilt/ lengthened pelvic floor mm, , plan to strengthen next session.   Explained pt will be able to wean off using pillow to sit on as he improved with PT, Addressed flexed tailbone which will help with sitting tolerance.      Neuro Re-ed    Neuro Re-ed Details  cued for childs pose rocking, and pelvic floor stretches to help minimize flexed tailbone  to improve sitting with less dependence on pillow      Modalities   Modalities Moist Heat      Moist Heat Therapy   Number Minutes Moist Heat 5 Minutes   unbilled   Moist Heat Location --   sacrum/ Low back     Manual Therapy   Manual therapy comments STM/ MWM to minimize flexed coccyx and improve  mobility of SIJ                 HOME EXERCISE PROGRAM: See  pt instruction section    ASSESSMENT:  CLINICAL IMPRESSION:  Pt showed levelled pelvis girdle and less slouched posture since last visit. Tailbone was assessed which showed flexed position and is contributing to his pain with sitting and needing take his pillow everywhere to sit.   Pt is compliant with not sitting in recliner which will continue to help with sitting pain.    Guided pt to elveated legs propped with support to minimize loading on pelvic floor and relief LBP   Explained how to use adjustable bed for this anti gravity position for pain relief   Explained and showed anatomy for multifidis mm to  help with improved posture / pelvic tilt/ lengthened pelvic floor mm, plan to strengthen next session.   Explained pt will be able to wean off using pillow to sit on as he improved with PT,  Regional interdependent approaches will yield greater benefits in pt's POC.   Following Tx today which pt tolerated without complaints,  pt demo'd less flexed coccyx and more mobility at ?LS junction where he typically experiences LBP. Cued for new stretches for back and sacrum./ coccyx. Pt demo'd HEP technique correctly.     These improvements will help promote optimize IAP system for improved pelvic floor function, trunk stability, gait, balance, stabilization with mobility tasks.  Plan to address pelvic floor issues once pelvis and spine are realigned to yield better outcomes.                                                       Pt benefits from skilled PT.    OBJECTIVE IMPAIRMENTS decreased activity tolerance, decreased coordination, decreased endurance, decreased mobility, difficulty walking, decreased ROM, decreased strength, decreased safety awareness, hypomobility, increased muscle spasms, impaired flexibility, improper body mechanics, postural dysfunction, and pain. scar restrictions   ACTIVITY LIMITATIONS  self-care,  sleep, home chores, work tasks    PARTICIPATION LIMITATIONS:   community, gym activities    PERSONAL FACTORS   affecting patient's functional outcome:    REHAB POTENTIAL: Good   CLINICAL DECISION MAKING: Evolving/moderate complexity   EVALUATION COMPLEXITY: Moderate    PATIENT EDUCATION:    Education details: Showed pt anatomy images. Explained muscles attachments/ connection, physiology of deep core system/ spinal- thoracic-pelvis-lower kinetic chain as they relate to pt's presentation, Sx, and past Hx. Explained what and how these areas of deficits need to be restored to balance and function    See Therapeutic activity / neuromuscular re-education section  Answered pt's questions.   Person educated: Patient Education method: Explanation, Demonstration, Tactile cues, Verbal cues, and Handouts Education comprehension: verbalized understanding, returned demonstration, verbal cues required, tactile cues required, and needs further education     PLAN: PT FREQUENCY: 1x/week   PT DURATION: 10 weeks   PLANNED INTERVENTIONS:   Gait training;Stair training;Functional mobility training;DME Instruction;Therapeutic activities;Therapeutic exercise;Balance training;Neuromuscular re-education;Patient/family education;Vestibular;Visual/perceptual remediation/compensation;Passive range of motion;Moist Heat;Cryotherapy;Traction;Canalith Repostioning;Joint Manipulations;Manual lymph drainage;Manual techniques;Scar mobilization;Energy conservation;Dry needling;ADLs/Self Care Home Management;Biofeedback;Electrical Stimulation;Taping    PLAN FOR NEXT SESSION: See clinical impression for plan     GOALS: Goals reviewed with patient? Yes  SHORT TERM GOALS: Target date: 04/24/2024    Pt will demo IND with HEP  Baseline: Not IND            Goal status: INITIAL   LONG TERM GOALS: Target date: 06/05/2024    1.Pt will demo proper deep core coordination without chest breathing and optimal excursion of diaphragm/pelvic floor in order to  promote spinal stability and pelvic floor function  Baseline: dyscoordination Goal status: INITIAL  2.  Pt will demo proper body mechanics in against gravity tasks and ADLs  work tasks, fitness  to minimize straining pelvic floor / back    Baseline: not IND, improper form that places strain on pelvic floor  Goal status: INITIAL    3. Pt will demo increased gait speed > 1.3 m/s with reciprocal gait pattern, longer stride length  in order to ambulate safely in community and return to fitness routine  Baseline: 0.96 m/s , pelvic sway to R, limited posterior rotatin of L pelvis, R thorax  Goal status: INITIAL    4. Pt will demo levelled pelvic girdle and shoulder height in order to progress to deep core strengthening HEP and restore mobility at spine, pelvis, gait, posture minimize falls, and improve balance  Baseline: R shoulder and iliac crest lowered  Goal status: INITIAL   5. Pt will decrease wearing pull ups to < 4 a day as a sign of continence improvement   Baseline: 5 Pull ups a day  Goal status: INITIAL  6. Pt will be report decreased time in recliner all day by 50% to increase mobility and improve posture Baseline: Pt is in a recliner starting 5:48 am to 10pm unless he is going to the store. Pt leans on the R side of his recliner  Goal status: INITIAL    Pia Lupe Plump, PT 04/05/2024, 2:25 PM

## 2024-04-05 NOTE — Patient Instructions (Signed)
  Childs pose rocking    Toes tucked, shoulders down and back, on forearms , hands shoulder width apart, fingers straight, elbow back , squeeze imaginary pencils in armpit, shoulder down and away from ears   10 reps    __   3 way childs pose ( center, to the R, to the L)    __  Stretch for pelvic floor to help tailbone    On belly: Riding horse edge of mattress  knee bent like riding a horse, move knee towards armpit and out  10 reps  ___  Restorative yoga for low back relief   Legs elevated with support , head and pelvis flat,  10-15 min

## 2024-04-11 ENCOUNTER — Ambulatory Visit: Admitting: Physical Therapy

## 2024-04-11 DIAGNOSIS — R279 Unspecified lack of coordination: Secondary | ICD-10-CM

## 2024-04-11 DIAGNOSIS — M533 Sacrococcygeal disorders, not elsewhere classified: Secondary | ICD-10-CM

## 2024-04-11 DIAGNOSIS — R2689 Other abnormalities of gait and mobility: Secondary | ICD-10-CM

## 2024-04-11 NOTE — Patient Instructions (Signed)
   Knee to chest  5 breaths  Scoot buttocks over to L, cross L thigh over and reach with R hand  5 breaths    Back on your back centered, L ankle over R thigh  5 breaths

## 2024-04-12 NOTE — Therapy (Addendum)
 OUTPATIENT PHYSICAL THERAPY TREATMENT    Patient Name: Ryan Hensley MRN: [id number] DOB:02-09-1971, 53 y.o., male Today's Date: 04/12/2024   PT End of Session - 04/12/24 1452     Visit Number 3    Number of Visits 10    Date for PT Re-Evaluation 06/05/24    PT Start Time 1635    PT Stop Time 1720    PT Time Calculation (min) 45 min    Activity Tolerance Patient tolerated treatment well;No increased pain    Behavior During Therapy WFL for tasks assessed/performed          Past Medical History:  Diagnosis Date   BPH (benign prostatic hyperplasia)    Diabetic neuropathy (HCC)    Diabetic retinopathy (HCC)    DM (diabetes mellitus), type 2 (HCC)    Dyspnea    Elevated PSA    GERD (gastroesophageal reflux disease)    Grade I diastolic dysfunction    Hyperlipidemia    Left eye injury, sequela 1978   Prostate cancer (HCC) 02/2023   Tobacco abuse    Past Surgical History:  Procedure Laterality Date   boxer fracture ORIF Right 2016   EYE SURGERY  2007   Strabismus surgery, left eye.  Age 38 yo.   PELVIC LYMPH NODE DISSECTION Bilateral 01/31/2024   Procedure: LYMPHADENECTOMY, PELVIS;  Surgeon: Penne Knee, MD;  Location: [facility];  Service: Urology;  Laterality: Bilateral;   ROBOT ASSISTED LAPAROSCOPIC RADICAL PROSTATECTOMY N/A 01/31/2024   Procedure: PROSTATECTOMY, RADICAL, ROBOT-ASSISTED, LAPAROSCOPIC;  Surgeon: Penne Knee, MD;  Location: [facility];  Service: Urology;  Laterality: N/A;   Patient Active Problem List   Diagnosis Date Noted   Onychomycosis 06/02/2023   Microalbuminuria 06/02/2023   Prostate cancer (HCC) 02/2023   Tobacco abuse disorder 01/28/2023   Exertional dyspnea 01/28/2023   Elevated PSA 10/30/2022   Benign prostatic hyperplasia (BPH) with straining on urination 10/30/2022   Lumbar back pain with radiculopathy affecting right lower extremity 02/25/2022   Food insecurity 02/25/2022   Cutaneous abscess of back (any part, except buttock)  12/19/2018   Gastroesophageal reflux disease 12/13/2018   Mixed hyperlipidemia 12/13/2018   Diabetic peripheral neuropathy (HCC) 12/13/2018   Diabetic retinopathy associated with type 2 diabetes mellitus (HCC) 12/13/2018   Type 2 diabetes mellitus with complication, without long-term current use of insulin  (HCC) 12/13/2018   Left eye injury, sequela 1978    PCP: [facility]   REFERRING PROVIDER: Leocadia MART DIAG: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 Pt has been doing his stretches  SUBJECTIVE STATEMENT ON  EVAL  03/27/24   1) Urinary leakage after RALP surgery:  leakage first caused pt to wear 10 Pull ups a day but now he is down to 5 Pull ups a day  Sometimes he does not feel the leakage occurring. Leakage with coughing . Pt sometimes sense urge to urination and sometimes he does not    2) LBP: pt has LBP for years prior to surgery. Pt points to both sides at SIJ area and it does not radiate down the leg. R hip is worse. Pt is about to schedule hip surgery.  LBP is occur all day , walking, stairs,  Pt is using scooter when grocery shopping. Pt wants to get a handicap sticker but he has not gotten one from his MD. Pt had pain walking parking lot to [employer]  10+ /10 pain    Pt sits on a recliner all day. Pt is in a recliner starting 5:48 am to 10pm unless he is going to the store. Pt leans on the R side of his recliner      PERTINENT HISTORY:    PAIN:  Are you having pain? Yes: see above    PRECAUTIONS: no lifting > 10 lbs   WEIGHT BEARING RESTRICTIONS:   No   FALLS:  Has patient fallen in last 6 months?  No   LIVING ENVIRONMENT: Lives with: brother  Lives in: One story  Stairs: 15 STE with rail and a hill .   I am scared of his house because the stairs look like they are about to fall off the house  Assistance Device at home:  WC, crutches, SPC   OCCUPATION: not working, on disability   PLOF: IND   PATIENT GOALS:   Get everything working to where it was     OBJECTIVE:    [facility] PT Assessment -        Palpation   SI assessment  levelled shoulders and pelvis    Palpation comment no more flexed coccyx  but SIJ hypomobile and tenderrness B          [facility] Adult PT Treatment/Exercise -      Neuro Re-ed    Neuro Re-ed Details  cued for knee to chest, figure stetch, IT stretch for more SIJ mobility. Modified manual technique to accommodate for comfort, cued for alignment and proiopception with use of towel under thigh and use of breathing technique to minmize pain      Exercises   Exercises Other Exercises    Other Exercises  see pt instructions      Modalities   Modalities Moist Heat      Moist Heat Therapy   Number Minutes Moist Heat 5 Minutes    Moist Heat Location --   lumbar/ scarum with lower rotation to lengthen L lumbar ( unbilled)     Manual Therapy   Manual therapy comments STM/ MWM, grade III PA mob , rotational mob,   to improve  mobility of SIJ            HOME EXERCISE PROGRAM: See pt instruction section    ASSESSMENT:  CLINICAL IMPRESSION:   Pt showed good carry over with no more flexed coccyx  but SIJ hypomobile and tenderrness bilaterally.   Manual Tx applied to improve SIJ mobility.  Modified manual  technique to accommodate for comfort. Cued for knee to chest, figure stetch, IT stretch for more SIJ mobility. Modified technique to accmodate for comfort.  Pt advanced to  SIJ stretches and was cued for alignment and  proiopception with use of towel under thigh and use of breathing technique to minmize pain   Plan to add multifidis mm to  help with improved posture / pelvic tilt/ lengthened pelvic floor mm, plan to strengthen next session.    Regional interdependent approaches will yield greater benefits in pt's POC.   Following Tx today which pt tolerated without complaints,  pt demo'd less flexed coccyx and more mobility at L/S junction where he typically experiences LBP. Cued for new stretches for back and sacrum./ coccyx. Pt demo'd HEP technique correctly.     These improvements will help promote optimize IAP system for improved pelvic floor function, trunk stability, gait, balance, stabilization with mobility tasks.  Plan to address pelvic floor issues once pelvis and spine are realigned to yield better outcomes.                                                       Pt benefits from skilled PT.    OBJECTIVE IMPAIRMENTS decreased activity tolerance, decreased coordination, decreased endurance, decreased mobility, difficulty walking, decreased ROM, decreased strength, decreased safety awareness, hypomobility, increased muscle spasms, impaired flexibility, improper body mechanics, postural dysfunction, and pain. scar restrictions   ACTIVITY LIMITATIONS  self-care,  sleep, home chores, work tasks    PARTICIPATION LIMITATIONS:  community, gym activities    PERSONAL FACTORS   affecting patient's functional outcome:    REHAB POTENTIAL: Good   CLINICAL DECISION MAKING: Evolving/moderate complexity   EVALUATION COMPLEXITY: Moderate    PATIENT EDUCATION:    Education details: Showed pt anatomy images. Explained muscles attachments/ connection, physiology of deep core system/ spinal- thoracic-pelvis-lower kinetic chain as they relate to pt's presentation, Sx, and past Hx. Explained what and how these areas of deficits need to be restored to balance and function    See Therapeutic activity /  neuromuscular re-education section  Answered pt's questions.   Person educated: Patient Education method: Explanation, Demonstration, Tactile cues, Verbal cues, and Handouts Education comprehension: verbalized understanding, returned demonstration, verbal cues required, tactile cues required, and needs further education     PLAN: PT FREQUENCY: 1x/week   PT DURATION: 10 weeks   PLANNED INTERVENTIONS:   Gait training;Stair training;Functional mobility training;DME Instruction;Therapeutic activities;Therapeutic exercise;Balance training;Neuromuscular re-education;Patient/family education;Vestibular;Visual/perceptual remediation/compensation;Passive range of motion;Moist Heat;Cryotherapy;Traction;Canalith Repostioning;Joint Manipulations;Manual lymph drainage;Manual techniques;Scar mobilization;Energy conservation;Dry needling;ADLs/Self Care Home Management;Biofeedback;Electrical Stimulation;Taping    PLAN FOR NEXT SESSION: See clinical impression for plan     GOALS: Goals reviewed with patient? Yes  SHORT TERM GOALS: Target date: 04/24/2024    Pt will demo IND with HEP                    Baseline: Not IND            Goal status: INITIAL   LONG TERM GOALS: Target date: 06/05/2024    1.Pt will demo proper deep core coordination without chest breathing and optimal excursion of diaphragm/pelvic floor in order to promote spinal stability and pelvic floor function  Baseline: dyscoordination Goal status: INITIAL  2.  Pt will demo proper body mechanics in against gravity tasks and ADLs  work tasks, fitness  to minimize straining pelvic floor / back    Baseline: not IND, improper form that places strain on pelvic floor  Goal status:  INITIAL    3. Pt will demo increased gait speed > 1.3 m/s with reciprocal gait pattern, longer stride length  in order to ambulate safely in community and return to fitness routine  Baseline: 0.96 m/s , pelvic sway to R, limited posterior rotatin of L  pelvis, R thorax  Goal status: INITIAL    4. Pt will demo levelled pelvic girdle and shoulder height in order to progress to deep core strengthening HEP and restore mobility at spine, pelvis, gait, posture minimize falls, and improve balance  Baseline: R shoulder and iliac crest lowered  Goal status: INITIAL   5. Pt will decrease wearing pull ups to < 4 a day as a sign of continence improvement   Baseline: 5 Pull ups a day  Goal status: INITIAL  6. Pt will be report decreased time in recliner all day by 50% to increase mobility and improve posture Baseline: Pt is in a recliner starting 5:48 am to 10pm unless he is going to the store. Pt leans on the R side of his recliner  Goal status: INITIAL    Pia Lupe Plump, PT 04/12/2024, 2:56 PM

## 2024-04-25 ENCOUNTER — Ambulatory Visit: Attending: Urology | Admitting: Physical Therapy

## 2024-04-25 DIAGNOSIS — R2689 Other abnormalities of gait and mobility: Secondary | ICD-10-CM | POA: Insufficient documentation

## 2024-04-25 DIAGNOSIS — M533 Sacrococcygeal disorders, not elsewhere classified: Secondary | ICD-10-CM | POA: Insufficient documentation

## 2024-04-25 DIAGNOSIS — R279 Unspecified lack of coordination: Secondary | ICD-10-CM | POA: Insufficient documentation

## 2024-04-25 NOTE — Patient Instructions (Signed)
 Lifting straight leg up with hands on armrest, elbow by ribs , shoulders down  Plant foot back at hip width apart   3 min   ___  SKI against chair   In front of chair, knee against seat to line knee above ankle,  back foot hip width apart, push off through ballmounds,    Push off through ballmounds , step back to under hip width   3 min    __  With same backward stepping  add  opp arm up, thumbs up, chin tuck, shoulders down  3 min

## 2024-04-25 NOTE — Therapy (Signed)
 OUTPATIENT PHYSICAL THERAPY TREATMENT    Patient Name: Ryan Hensley MRN: [id number] DOB:08-21-1970, 53 y.o., male Today's Date: 04/25/2024   PT End of Session - 04/25/24 1638     Visit Number 4    Number of Visits 10    Date for PT Re-Evaluation 06/05/24    PT Start Time 1640    PT Stop Time 1720    PT Time Calculation (min) 40 min    Activity Tolerance Patient tolerated treatment well;No increased pain    Behavior During Therapy WFL for tasks assessed/performed          Past Medical History:  Diagnosis Date   BPH (benign prostatic hyperplasia)    Diabetic neuropathy (HCC)    Diabetic retinopathy (HCC)    DM (diabetes mellitus), type 2 (HCC)    Dyspnea    Elevated PSA    GERD (gastroesophageal reflux disease)    Grade I diastolic dysfunction    Hyperlipidemia    Left eye injury, sequela 1978   Prostate cancer (HCC) 02/2023   Tobacco abuse    Past Surgical History:  Procedure Laterality Date   boxer fracture ORIF Right 2016   EYE SURGERY  2007   Strabismus surgery, left eye.  Age 5 yo.   PELVIC LYMPH NODE DISSECTION Bilateral 01/31/2024   Procedure: LYMPHADENECTOMY, PELVIS;  Surgeon: Penne Knee, MD;  Location: [facility];  Service: Urology;  Laterality: Bilateral;   ROBOT ASSISTED LAPAROSCOPIC RADICAL PROSTATECTOMY N/A 01/31/2024   Procedure: PROSTATECTOMY, RADICAL, ROBOT-ASSISTED, LAPAROSCOPIC;  Surgeon: Penne Knee, MD;  Location: [facility];  Service: Urology;  Laterality: N/A;   Patient Active Problem List   Diagnosis Date Noted   Onychomycosis 06/02/2023   Microalbuminuria 06/02/2023   Prostate cancer (HCC) 02/2023   Tobacco abuse disorder 01/28/2023   Exertional dyspnea 01/28/2023   Elevated PSA 10/30/2022   Benign prostatic hyperplasia (BPH) with straining on urination 10/30/2022   Lumbar back pain with radiculopathy affecting right lower extremity 02/25/2022   Food insecurity 02/25/2022   Cutaneous abscess of back (any part, except buttock)  12/19/2018   Gastroesophageal reflux disease 12/13/2018   Mixed hyperlipidemia 12/13/2018   Diabetic peripheral neuropathy (HCC) 12/13/2018   Diabetic retinopathy associated with type 2 diabetes mellitus (HCC) 12/13/2018   Type 2 diabetes mellitus with complication, without long-term current use of insulin  (HCC) 12/13/2018   Left eye injury, sequela 1978    PCP: [facility]   REFERRING PROVIDER: Leocadia MART DIAG: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    Pt is not sitting on his recliner anymore. It still hurts to sit in hard surfaces   LBP is still the same.  SUBJECTIVE STATEMENT ON  EVAL  03/27/24   1) Urinary leakage after RALP surgery:  leakage first caused pt to wear 10 Pull ups a day but now he is down to 5 Pull ups a day  Sometimes he does not feel the leakage occurring. Leakage with coughing . Pt sometimes sense urge to urination and sometimes he does not    2) LBP: pt has LBP for years prior to surgery. Pt points to both sides at SIJ area and it does not radiate down the leg. R hip is worse. Pt is about to schedule hip surgery.  LBP is occur all day , walking, stairs,  Pt is using scooter when grocery shopping. Pt wants to get a handicap sticker but he has not gotten one from his MD. Pt had pain walking parking lot to [employer]  10+ /10 pain    Pt sits on a recliner all day. Pt is in a recliner starting 5:48 am to 10pm unless he is going to the store. Pt  leans on the R side of his recliner      PERTINENT HISTORY:    PAIN:  Are you having pain? Yes: see above   PRECAUTIONS: no lifting > 10 lbs   WEIGHT BEARING RESTRICTIONS:   No   FALLS:  Has patient fallen in last 6 months?  No   LIVING ENVIRONMENT: Lives with: brother  Lives in: One story  Stairs: 15 STE with rail and a hill .   I am scared of his house because the stairs look like they are about to fall off the house  Assistance Device at home:  WC, crutches, SPC   OCCUPATION: not working, on disability   PLOF: IND   PATIENT GOALS:   Get everything working to where it was     OBJECTIVE:    [facility] PT Assessment - 04/25/24 0001       Coordination   Coordination and Movement Description multifids / glut/ hamstring firing:  glut delayed in firing  B          [facility] Adult PT Treatment/Exercise - 04/25/24 1716       Neuro Re-ed    Neuro Re-ed Details  excessive cues for propioception and alignment forbackward lunges and thoracolumbar strengthening  to encourage cardio and help increase LKC posterior strengthening to help with LBP and sitting on hard surfaces      Exercises   Exercises Other Exercises    Other Exercises  see pt instructions designed for glut strengthening and propicoeption training              HOME EXERCISE PROGRAM: See pt instruction section    ASSESSMENT:  CLINICAL IMPRESSION:  Improvements include :  Pt showed levelled pelvis girdle and less slouched posture since last visit. Tailbone is no longer flexed, SIJ mobility is restored bilaterally  Pt has more upright posture   Pt is compliant with not sitting in recliner which will continue to help with sitting pain.    Deferred to hip strengthening today and will wait to train multifidis mm at upcoming session because glut was not firing in time with sequence to multidifis and hamstring.  Anticipate more posterior [facility] training will help pt sit on  harder surfaces. Providede  cues for propioception and alignment forbackward lunges and thoracolumbar strengthening  to encourage cardio and help increase LKC posterior strengthening to help with LBP and sitting on hard surfaces  Anticipate this HEP will  help with improved posture /  pelvic tilt/ lengthened pelvic floor mm   Regional interdependent approaches will yield greater benefits in pt's POC.      These improvements will help promote optimize IAP system for improved pelvic floor function, trunk stability, gait, balance, stabilization with mobility tasks.  Plan to address pelvic floor issues once pelvis and spine are realigned to yield better outcomes.                                                       Pt benefits from skilled PT.    OBJECTIVE IMPAIRMENTS decreased activity tolerance, decreased coordination, decreased endurance, decreased mobility, difficulty walking, decreased ROM, decreased strength, decreased safety awareness, hypomobility, increased muscle spasms, impaired flexibility, improper body mechanics, postural dysfunction, and pain. scar restrictions   ACTIVITY LIMITATIONS  self-care,  sleep, home chores, work tasks    PARTICIPATION LIMITATIONS:  community, gym activities    PERSONAL FACTORS   affecting patient's functional outcome:    REHAB POTENTIAL: Good   CLINICAL DECISION MAKING: Evolving/moderate complexity   EVALUATION COMPLEXITY: Moderate    PATIENT EDUCATION:    Education details: Showed pt anatomy images. Explained muscles attachments/ connection, physiology of deep core system/ spinal- thoracic-pelvis-lower kinetic chain as they relate to pt's presentation, Sx, and past Hx. Explained what and how these areas of deficits need to be restored to balance and function    See Therapeutic activity / neuromuscular re-education section  Answered pt's questions.   Person educated: Patient Education method: Explanation, Demonstration, Tactile cues, Verbal cues, and  Handouts Education comprehension: verbalized understanding, returned demonstration, verbal cues required, tactile cues required, and needs further education     PLAN: PT FREQUENCY: 1x/week   PT DURATION: 10 weeks   PLANNED INTERVENTIONS:   Gait training;Stair training;Functional mobility training;DME Instruction;Therapeutic activities;Therapeutic exercise;Balance training;Neuromuscular re-education;Patient/family education;Vestibular;Visual/perceptual remediation/compensation;Passive range of motion;Moist Heat;Cryotherapy;Traction;Canalith Repostioning;Joint Manipulations;Manual lymph drainage;Manual techniques;Scar mobilization;Energy conservation;Dry needling;ADLs/Self Care Home Management;Biofeedback;Electrical Stimulation;Taping    PLAN FOR NEXT SESSION: See clinical impression for plan     GOALS: Goals reviewed with patient? Yes  SHORT TERM GOALS: Target date: 04/24/2024    Pt will demo IND with HEP                    Baseline: Not IND            Goal status: INITIAL   LONG TERM GOALS: Target date: 06/05/2024    1.Pt will demo proper deep core coordination without chest breathing and optimal excursion of diaphragm/pelvic floor in order to promote spinal stability and pelvic floor function  Baseline: dyscoordination Goal status: INITIAL  2.  Pt will demo proper body mechanics in against gravity tasks and ADLs  work tasks, fitness  to minimize straining pelvic floor / back    Baseline: not IND, improper form that places strain on pelvic floor  Goal status: INITIAL    3. Pt will demo increased gait speed > 1.3 m/s with reciprocal gait pattern, longer stride length  in order to ambulate safely in community and return to fitness routine  Baseline: 0.96 m/s , pelvic sway to R, limited posterior rotatin of L pelvis, R thorax  Goal status: INITIAL    4. Pt will demo levelled pelvic girdle and shoulder height in order to progress to deep core strengthening HEP and restore  mobility at spine, pelvis,  gait, posture minimize falls, and improve balance  Baseline: R shoulder and iliac crest lowered  Goal status: INITIAL   5. Pt will decrease wearing pull ups to < 4 a day as a sign of continence improvement   Baseline: 5 Pull ups a day  Goal status: INITIAL  6. Pt will be report decreased time in recliner all day by 50% to increase mobility and improve posture Baseline: Pt is in a recliner starting 5:48 am to 10pm unless he is going to the store. Pt leans on the R side of his recliner  Goal status: INITIAL    Pia Lupe Plump, PT 04/25/2024, 4:43 PM

## 2024-05-02 ENCOUNTER — Encounter: Admitting: Physical Therapy

## 2024-05-03 ENCOUNTER — Ambulatory Visit: Admitting: Physical Therapy

## 2024-05-08 ENCOUNTER — Ambulatory Visit: Payer: Self-pay | Admitting: Internal Medicine

## 2024-05-09 ENCOUNTER — Encounter: Admitting: Physical Therapy

## 2024-05-10 ENCOUNTER — Ambulatory Visit: Admitting: Physical Therapy

## 2024-05-10 DIAGNOSIS — M533 Sacrococcygeal disorders, not elsewhere classified: Secondary | ICD-10-CM | POA: Diagnosis not present

## 2024-05-10 DIAGNOSIS — R2689 Other abnormalities of gait and mobility: Secondary | ICD-10-CM

## 2024-05-10 DIAGNOSIS — R279 Unspecified lack of coordination: Secondary | ICD-10-CM

## 2024-05-10 NOTE — Patient Instructions (Signed)
 Stretches :     Motion is lotion  - good morning and evening     On your side - winging and brushing   Pillow between knees and behind back     On your back with towel roll under the neck:  -deep core level 1 ( 6 breaths)  -deep core level 2 ( 6 min)   - ZigZag stretch scoot hips to one side and rock knees to opposite, arms by side ,palms up      On your other side - winging and brushing   Pillow between knees and behind back                Stepping backward and opp arm by chair 3 min ( heel and toes straight, heel is up   Minisquats 20 reps x 3 x day

## 2024-05-10 NOTE — Therapy (Signed)
 OUTPATIENT PHYSICAL THERAPY TREATMENT    Patient Name: Ryan Hensley MRN: [id number] DOB:Jun 07, 1971, 52 y.o., male Today's Date: 05/10/2024   PT End of Session - 05/10/24 1112     Visit Number 5    Number of Visits 10    Date for Recertification  06/05/24    PT Start Time 1109    PT Stop Time 1150    PT Time Calculation (min) 41 min    Activity Tolerance Patient tolerated treatment well;No increased pain    Behavior During Therapy WFL for tasks assessed/performed          Past Medical History:  Diagnosis Date   BPH (benign prostatic hyperplasia)    Diabetic neuropathy (HCC)    Diabetic retinopathy (HCC)    DM (diabetes mellitus), type 2 (HCC)    Dyspnea    Elevated PSA    GERD (gastroesophageal reflux disease)    Grade I diastolic dysfunction    Hyperlipidemia    Left eye injury, sequela 1978   Prostate cancer (HCC) 02/2023   Tobacco abuse    Past Surgical History:  Procedure Laterality Date   boxer fracture ORIF Right 2016   EYE SURGERY  2007   Strabismus surgery, left eye.  Age 51 yo.   PELVIC LYMPH NODE DISSECTION Bilateral 01/31/2024   Procedure: LYMPHADENECTOMY, PELVIS;  Surgeon: Penne Knee, MD;  Location: [facility];  Service: Urology;  Laterality: Bilateral;   ROBOT ASSISTED LAPAROSCOPIC RADICAL PROSTATECTOMY N/A 01/31/2024   Procedure: PROSTATECTOMY, RADICAL, ROBOT-ASSISTED, LAPAROSCOPIC;  Surgeon: Penne Knee, MD;  Location: [facility];  Service: Urology;  Laterality: N/A;   Patient Active Problem List   Diagnosis Date Noted   Onychomycosis 06/02/2023   Microalbuminuria 06/02/2023   Prostate cancer (HCC) 02/2023   Tobacco abuse disorder 01/28/2023   Exertional dyspnea 01/28/2023   Elevated PSA 10/30/2022   Benign prostatic hyperplasia (BPH) with straining on urination 10/30/2022   Lumbar back pain with radiculopathy affecting right lower extremity 02/25/2022   Food insecurity 02/25/2022   Cutaneous abscess of back (any part, except buttock)  12/19/2018   Gastroesophageal reflux disease 12/13/2018   Mixed hyperlipidemia 12/13/2018   Diabetic peripheral neuropathy (HCC) 12/13/2018   Diabetic retinopathy associated with type 2 diabetes mellitus (HCC) 12/13/2018   Type 2 diabetes mellitus with complication, without long-term current use of insulin  (HCC) 12/13/2018   Left eye injury, sequela 1978    PCP: [facility]   REFERRING PROVIDER: Leocadia MART DIAG: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 Rationale for Evaluation and Treatment Rehabilitation  THERAPY DIAG:  Sacrococcygeal disorders, not elsewhere classified  Other abnormalities of gait and mobility  Unspecified lack of coordination  ONSET DATE: June 2025   SUBJECTIVE  TODAY :                 Pt reported he is having problems learning the stepping backward exercise  Pt has not been doing deep core HEP regularly                                                                                                SUBJECTIVE STATEMENT ON  EVAL  03/27/24   1) Urinary leakage after RALP surgery:  leakage first caused pt to wear 10 Pull ups a day but now he is down to 5 Pull ups a day  Sometimes he does not feel the leakage occurring. Leakage with coughing . Pt sometimes sense urge to urination and sometimes he does not    2) LBP: pt has LBP for years prior to surgery. Pt points to both sides at SIJ area and it does not radiate down the leg. R hip is worse. Pt is about to schedule hip surgery.  LBP is occur all day , walking, stairs,  Pt is using scooter when grocery shopping. Pt wants to get a handicap sticker but he has not gotten one from his MD. Pt had pain walking parking lot to [employer]  10+ /10 pain    Pt sits on a recliner all day. Pt is in a recliner starting 5:48 am to 10pm unless he is going to the store. Pt leans on the R side of his recliner      PERTINENT HISTORY:    PAIN:  Are you having pain? Yes: see above   PRECAUTIONS: no lifting > 10 lbs    WEIGHT BEARING RESTRICTIONS:   No   FALLS:  Has patient fallen in last 6 months?  No   LIVING ENVIRONMENT: Lives with: brother  Lives in: One story  Stairs: 15 STE with rail and a hill .   I am scared of his house because the stairs look like they are about to fall off the house  Assistance Device at home:  WC, crutches, SPC   OCCUPATION: not working, on disability   PLOF: IND   PATIENT GOALS:   Get everything working to where it was     OBJECTIVE:    [facility] PT Assessment - 05/10/24 1113       Coordination   Coordination and Movement Description overuse of ab with deep core levle 1-2 exericses  , poor carry over with thoracic stretches      Lunges   Comments backward lunges: poor prioception with foot and knee alignment with COM ,      Palpation   SI assessment  levelled pelvis , R shoulder lowered          [facility] Adult PT Treatment/Exercise - 05/10/24 1744       Self-Care   Self-Care Other Self-Care Comments    Other Self-Care Comments  consolidated HEP into a chart to enhance compliance, encouarged more compliance with deep core HEP to improve leakage and to return to weight lifting, recommended pt to weight lifting of 5 lb instead of 15 lbs because pt needs to build deep core system first before returning to increased weights      Neuro Re-ed    Neuro Re-ed Details  cued for past HEP for technique and propiocoeption      Exercises   Other Exercises  see pt instructions designed for glut strengthening and propicoeption training              HOME EXERCISE PROGRAM: See pt instruction section    ASSESSMENT:  CLINICAL IMPRESSION:  Improvements include :  Pt showed levelled pelvis girdle and less slouched posture since last visit. Tailbone is no longer flexed, SIJ mobility is restored bilaterally  Pt has more upright posture   Pt reported decreased pull-ups from 5 per day to 2 per day  Today reviewed deep core exercises and  stretches and stepping back ward HEP  for proprioception and technique.  Pt demo'd correctly post Tx  Consolidated HEP into a chart to enhance compliance.  Encouarged more compliance with deep core HEP to improve leakage and to return to weight lifting, recommended pt to weight lifting of 5 lb instead of 15 lbs because pt needs to build deep core system first before returning to increased weights    Plan to add multifidis mm next session.  Anticipate more posterior [facility] training will help pt sit on  harder surfaces. Providede cues for propioception and alignment forbackward lunges and thoracolumbar strengthening  to encourage cardio and help increase LKC posterior strengthening to help with LBP and sitting on hard surfaces  Anticipate this HEP will  help with improved posture / pelvic tilt/ lengthened pelvic floor mm   Regional interdependent approaches will yield greater benefits in pt's POC.      These improvements will help promote optimize IAP system for improved pelvic floor function, trunk stability, gait, balance, stabilization with mobility tasks.  Plan to address pelvic floor issues once pelvis and spine are realigned to yield better outcomes.                                                       Pt benefits from skilled PT.    OBJECTIVE IMPAIRMENTS decreased activity tolerance, decreased coordination, decreased endurance, decreased mobility, difficulty walking, decreased ROM, decreased strength, decreased safety awareness, hypomobility, increased muscle spasms, impaired flexibility, improper body mechanics, postural dysfunction, and pain. scar restrictions   ACTIVITY LIMITATIONS  self-care,  sleep, home chores, work tasks    PARTICIPATION LIMITATIONS:  community, gym activities    PERSONAL FACTORS   affecting patient's functional outcome:    REHAB POTENTIAL: Good   CLINICAL DECISION MAKING: Evolving/moderate complexity   EVALUATION COMPLEXITY: Moderate    PATIENT  EDUCATION:    Education details: Showed pt anatomy images. Explained muscles attachments/ connection, physiology of deep core system/ spinal- thoracic-pelvis-lower kinetic chain as they relate to pt's presentation, Sx, and past Hx. Explained what and how these areas of deficits need to be restored to balance and function    See Therapeutic activity / neuromuscular re-education section  Answered pt's questions.   Person educated: Patient Education method: Explanation, Demonstration, Tactile cues, Verbal cues, and Handouts Education comprehension: verbalized understanding, returned demonstration, verbal cues required, tactile cues required, and needs further education     PLAN: PT FREQUENCY: 1x/week   PT DURATION: 10 weeks   PLANNED INTERVENTIONS:   Gait training;Stair training;Functional mobility training;DME Instruction;Therapeutic activities;Therapeutic exercise;Balance training;Neuromuscular re-education;Patient/family education;Vestibular;Visual/perceptual remediation/compensation;Passive range of motion;Moist Heat;Cryotherapy;Traction;Canalith Repostioning;Joint Manipulations;Manual lymph drainage;Manual techniques;Scar mobilization;Energy conservation;Dry needling;ADLs/Self Care Home Management;Biofeedback;Electrical Stimulation;Taping    PLAN FOR NEXT SESSION: See clinical impression for plan     GOALS: Goals reviewed with patient? Yes  SHORT TERM GOALS: Target date: 04/24/2024    Pt will demo IND with HEP                    Baseline: Not IND            Goal status: INITIAL   LONG TERM GOALS: Target date: 06/05/2024    1.Pt will demo proper deep core coordination without chest breathing and optimal excursion of diaphragm/pelvic floor in order to  promote spinal stability and pelvic floor function  Baseline: dyscoordination Goal status: INITIAL  2.  Pt will demo proper body mechanics in against gravity tasks and ADLs  work tasks, fitness  to minimize straining pelvic  floor / back    Baseline: not IND, improper form that places strain on pelvic floor  Goal status: INITIAL    3. Pt will demo increased gait speed > 1.3 m/s with reciprocal gait pattern, longer stride length  in order to ambulate safely in community and return to fitness routine  Baseline: 0.96 m/s , pelvic sway to R, limited posterior rotatin of L pelvis, R thorax  Goal status: INITIAL    4. Pt will demo levelled pelvic girdle and shoulder height in order to progress to deep core strengthening HEP and restore mobility at spine, pelvis, gait, posture minimize falls, and improve balance  Baseline: R shoulder and iliac crest lowered  Goal status: INITIAL   5. Pt will decrease wearing pull ups to < 4 a day as a sign of continence improvement   Baseline: 5 Pull ups a day  Goal status: INITIAL  6. Pt will be report decreased time in recliner all day by 50% to increase mobility and improve posture Baseline: Pt is in a recliner starting 5:48 am to 10pm unless he is going to the store. Pt leans on the R side of his recliner  Goal status: INITIAL    Pia Lupe Plump, PT 05/10/2024, 5:47 PM

## 2024-05-16 ENCOUNTER — Encounter: Admitting: Physical Therapy

## 2024-05-17 ENCOUNTER — Ambulatory Visit: Admitting: Physical Therapy

## 2024-05-23 ENCOUNTER — Ambulatory Visit: Attending: Urology | Admitting: Physical Therapy

## 2024-05-23 DIAGNOSIS — R279 Unspecified lack of coordination: Secondary | ICD-10-CM | POA: Insufficient documentation

## 2024-05-23 DIAGNOSIS — M533 Sacrococcygeal disorders, not elsewhere classified: Secondary | ICD-10-CM | POA: Diagnosis present

## 2024-05-23 DIAGNOSIS — R2689 Other abnormalities of gait and mobility: Secondary | ICD-10-CM | POA: Insufficient documentation

## 2024-05-23 NOTE — Patient Instructions (Signed)
 Complimentary stretches to bowling to minimize asymmetries to spine and pelvis   Wall stretch for the R side to stretch before and after bowling  With left foot forward, R foot back ( opposite of bowling) feet are hip width apart   __  Do not do the twist standing to crack your back that you were doing on your own   1)   Seated twist for midback   5 breaths   2) kitchen counter stretches  Hands on kitchen counter,   Palms shoulder width apart  Minisquat postion Trunk is parallel to floor  A) Pull buttocks back to lengthen spine, knees bent  3 breaths   B) Bring R hand to the L, and stretch the R side trunk  3 breaths   Brings hands to center again Do the same to the L side stretch by placing L hand on top of R    C) L hand on L hip, rotate rib and look up at the ceiling L  ( for this week to realign spine)    Later, you can do both sides  ____   Hip abductor and hip flexors stretch at the same time    Alternative if hip is tight,   Sit at 45 deg turn with R leg and knee on edge of chair/ bench, L buttock hanging off the edge to bring the L foot back like a lunge, toes bent, lower heel to feel quad stretch,  pay attention to keeping pinky and first toe ballmound planted to align toes forward so ankles are not twerked   Repeat with other side   ___

## 2024-05-23 NOTE — Therapy (Signed)
 OUTPATIENT PHYSICAL THERAPY TREATMENT    Patient Name: Ryan Hensley MRN: [id number] DOB:01/19/1971, 53 y.o., male Today's Date: 05/23/2024   PT End of Session - 05/23/24 1644     Visit Number 6    Number of Visits 10    Date for Recertification  06/05/24    PT Start Time 1635    PT Stop Time 1715    PT Time Calculation (min) 40 min    Activity Tolerance Patient tolerated treatment well;No increased pain    Behavior During Therapy WFL for tasks assessed/performed          Past Medical History:  Diagnosis Date   BPH (benign prostatic hyperplasia)    Diabetic neuropathy (HCC)    Diabetic retinopathy (HCC)    DM (diabetes mellitus), type 2 (HCC)    Dyspnea    Elevated PSA    GERD (gastroesophageal reflux disease)    Grade I diastolic dysfunction    Hyperlipidemia    Left eye injury, sequela 1978   Prostate cancer (HCC) 02/2023   Tobacco abuse    Past Surgical History:  Procedure Laterality Date   boxer fracture ORIF Right 2016   EYE SURGERY  2007   Strabismus surgery, left eye.  Age 58 yo.   PELVIC LYMPH NODE DISSECTION Bilateral 01/31/2024   Procedure: LYMPHADENECTOMY, PELVIS;  Surgeon: Penne Knee, MD;  Location: [facility];  Service: Urology;  Laterality: Bilateral;   ROBOT ASSISTED LAPAROSCOPIC RADICAL PROSTATECTOMY N/A 01/31/2024   Procedure: PROSTATECTOMY, RADICAL, ROBOT-ASSISTED, LAPAROSCOPIC;  Surgeon: Penne Knee, MD;  Location: [facility] OR[facility];  Service: Urology;  Laterality: N/A;   Patient Active Problem List   Diagnosis Date Noted   Onychomycosis 06/02/2023   Microalbuminuria 06/02/2023   Prostate cancer (HCC) 02/2023   Tobacco abuse disorder 01/28/2023   Exertional dyspnea 01/28/2023   Elevated PSA 10/30/2022   Benign prostatic hyperplasia (BPH) with straining on urination 10/30/2022   Lumbar back pain with radiculopathy affecting right lower extremity 02/25/2022   Food insecurity 02/25/2022   Cutaneous abscess of back (any part, except buttock)  12/19/2018   Gastroesophageal reflux disease 12/13/2018   Mixed hyperlipidemia 12/13/2018   Diabetic peripheral neuropathy (HCC) 12/13/2018   Diabetic retinopathy associated with type 2 diabetes mellitus (HCC) 12/13/2018   Type 2 diabetes mellitus with complication, without long-term current use of insulin  (HCC) 12/13/2018   Left eye injury, sequela 1978    PCP: [facility]   REFERRING PROVIDER: Leocadia MART DIAG: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 Sometimes the PT exercises help and sometimes they do not, it depends on how he fels that day. The weather also affect his whole body.                Urinary leakage is better . Pt still is wearing 2 pull ups per day instead of 5 per day. Pt has leakage with sneezing  SUBJECTIVE STATEMENT ON  EVAL  03/27/24   1) Urinary leakage after RALP surgery:  leakage first caused pt to wear 10 Pull ups a day but now he is down to 5 Pull ups a day  Sometimes he does not feel the leakage occurring. Leakage with coughing . Pt sometimes sense urge to urination and sometimes he does not    2) LBP: pt has LBP for years prior to surgery. Pt points to both sides at SIJ area and it does not radiate down the leg. R hip is worse. Pt is about to schedule hip surgery.  LBP is occur all day , walking, stairs,  Pt is using scooter when grocery shopping. Pt wants to get a handicap sticker but he has not gotten one from his MD. Pt had pain walking parking lot to [employer]  10+ /10 pain    Pt sits on a recliner all day. Pt is in a recliner starting 5:48 am to 10pm unless he is going to the  store. Pt leans on the R side of his recliner      PERTINENT HISTORY:    PAIN:  Are you having pain? Yes: see above   PRECAUTIONS: no lifting > 10 lbs   WEIGHT BEARING RESTRICTIONS:   No   FALLS:  Has patient fallen in last 6 months?  No   LIVING ENVIRONMENT: Lives with: brother  Lives in: One story  Stairs: 15 STE with rail and a hill .   I am scared of his house because the stairs look like they are about to fall off the house  Assistance Device at home:  WC, crutches, SPC   OCCUPATION: not working, on disability   PLOF: IND   PATIENT GOALS:   Get everything working to where it was     OBJECTIVE:     [facility] PT Assessment - 05/23/24 1647       AROM   Overall AROM Comments L FADDIR more restiracted than R, flexion limited      Palpation   SI assessment  R shoulder, R iliac crest lowered today  ( levelled pelvis post Tx, pt reported decreased LBP by 50%)                    [facility] Adult PT Treatment/Exercise - 05/23/24 1719       Therapeutic Activites    Other Therapeutic Activities explained complimentary stetches to bowling to minmize asymmetries to spine and pelvis      Neuro Re-ed    Neuro Re-ed Details  Provided propioceptive and alignment cues for new HEP stretches,.   modified knee to chest to seated figure 4 stretch due to complaint of R hip pain, cued for use of towel to pull L thigh to chest to minimize L knee pain when performring knee to chest ,  cued for R sided stretches to compliment bowling to minimize asymetrries at R flank and L SIJ hypomobility              HOME EXERCISE PROGRAM: See pt instruction section    ASSESSMENT:  CLINICAL IMPRESSION:  Improvements include :  Pt showed levelled pelvis girdle and less slouched posture since last visit. Tailbone is no longer flexed, SIJ mobility is restored bilaterally  Pt has more upright posture Pt reported decreased pull-ups from 5 per day to 2 per day                   Today addressed increased  LBP . Pt had started back to bowling two months ago. Pt had levelled pelvic alignment previous visits but today showed R shoulder and iliac crest lowered which is likely related to asymmetries related to bowling.   Provided propioceptive and alignment cues for new HEP stretches,.   modified knee to chest to seated figure 4 stretch due to complaint of R hip pain, cued for use of towel to pull L thigh to chest to minimize L knee pain when performring knee to chest ,  cued for R sided stretches to compliment bowling to minimize asymetrries at R flank and L SIJ hypomobility .   Post Tx,  new HEP with  complimentary stretches to bowling and to improve L SIJ mobility and R flank lengthening, pt reported 50% less LBP.      Plan to add multifidis mm next session.  Anticipate more posterior [facility] training will help pt sit on  harder surfaces. Providede cues for propioception and alignment forbackward lunges and thoracolumbar strengthening  to encourage cardio and help increase LKC posterior strengthening to help with LBP and sitting on hard surfaces  Anticipate this HEP will  help with improved posture / pelvic tilt/ lengthened pelvic floor mm   Regional interdependent approaches will yield greater benefits in pt's POC.      These improvements will help promote optimize IAP system for improved pelvic floor function, trunk stability, gait, balance, stabilization with mobility tasks.  Plan to address pelvic floor issues once pelvis and spine are realigned to yield better outcomes.                                                       Pt benefits from skilled PT.    OBJECTIVE IMPAIRMENTS decreased activity tolerance, decreased coordination, decreased endurance, decreased mobility, difficulty walking, decreased ROM, decreased strength, decreased safety awareness, hypomobility, increased muscle spasms, impaired flexibility, improper body mechanics, postural dysfunction, and  pain. scar restrictions   ACTIVITY LIMITATIONS  self-care,  sleep, home chores, work tasks    PARTICIPATION LIMITATIONS:  community, gym activities    PERSONAL FACTORS   affecting patient's functional outcome:    REHAB POTENTIAL: Good   CLINICAL DECISION MAKING: Evolving/moderate complexity   EVALUATION COMPLEXITY: Moderate    PATIENT EDUCATION:    Education details: Showed pt anatomy images. Explained muscles attachments/ connection, physiology of deep core system/ spinal- thoracic-pelvis-lower kinetic chain as they relate to pt's presentation, Sx, and past Hx. Explained what and how these areas of deficits need to be restored to balance and function    See Therapeutic activity / neuromuscular re-education section  Answered pt's questions.   Person educated: Patient Education method: Explanation, Demonstration, Tactile cues, Verbal cues, and Handouts Education comprehension: verbalized understanding, returned demonstration, verbal cues required, tactile cues required, and needs further education     PLAN: PT FREQUENCY: 1x/week   PT DURATION: 10 weeks   PLANNED INTERVENTIONS:   Gait training;Stair training;Functional mobility training;DME Instruction;Therapeutic activities;Therapeutic exercise;Balance training;Neuromuscular re-education;Patient/family education;Vestibular;Visual/perceptual remediation/compensation;Passive range of motion;Moist Heat;Cryotherapy;Traction;Canalith Repostioning;Joint Manipulations;Manual lymph drainage;Manual techniques;Scar mobilization;Energy conservation;Dry needling;ADLs/Self Care Home Management;Biofeedback;Electrical Stimulation;Taping    PLAN FOR NEXT SESSION: See clinical impression for plan     GOALS: Goals reviewed with patient? Yes  SHORT TERM GOALS: Target date: 04/24/2024    Pt will demo IND with HEP  Baseline: Not IND            Goal status: INITIAL   LONG TERM GOALS: Target date: 06/05/2024    1.Pt  will demo proper deep core coordination without chest breathing and optimal excursion of diaphragm/pelvic floor in order to promote spinal stability and pelvic floor function  Baseline: dyscoordination Goal status: INITIAL  2.  Pt will demo proper body mechanics in against gravity tasks and ADLs  work tasks, fitness  to minimize straining pelvic floor / back    Baseline: not IND, improper form that places strain on pelvic floor  Goal status: INITIAL    3. Pt will demo increased gait speed > 1.3 m/s with reciprocal gait pattern, longer stride length  in order to ambulate safely in community and return to fitness routine  Baseline: 0.96 m/s , pelvic sway to R, limited posterior rotatin of L pelvis, R thorax  Goal status: INITIAL    4. Pt will demo levelled pelvic girdle and shoulder height in order to progress to deep core strengthening HEP and restore mobility at spine, pelvis, gait, posture minimize falls, and improve balance  Baseline: R shoulder and iliac crest lowered  Goal status: INITIAL   5. Pt will decrease wearing pull ups to < 4 a day as a sign of continence improvement   Baseline: 5 Pull ups a day  Goal status: INITIAL  6. Pt will be report decreased time in recliner all day by 50% to increase mobility and improve posture Baseline: Pt is in a recliner starting 5:48 am to 10pm unless he is going to the store. Pt leans on the R side of his recliner  Goal status: INITIAL    Pia Lupe Plump, PT 05/23/2024, 4:45 PM

## 2024-06-07 ENCOUNTER — Other Ambulatory Visit: Payer: Self-pay | Admitting: Internal Medicine

## 2024-06-07 ENCOUNTER — Ambulatory Visit: Payer: Medicaid Other | Admitting: Internal Medicine

## 2024-06-07 ENCOUNTER — Encounter: Payer: Self-pay | Admitting: Internal Medicine

## 2024-06-07 VITALS — BP 110/65 | HR 64 | Resp 18 | Ht 68.0 in | Wt 163.5 lb

## 2024-06-07 DIAGNOSIS — Z0000 Encounter for general adult medical examination without abnormal findings: Secondary | ICD-10-CM | POA: Diagnosis not present

## 2024-06-07 DIAGNOSIS — R809 Proteinuria, unspecified: Secondary | ICD-10-CM

## 2024-06-07 DIAGNOSIS — C61 Malignant neoplasm of prostate: Secondary | ICD-10-CM

## 2024-06-07 DIAGNOSIS — Z720 Tobacco use: Secondary | ICD-10-CM

## 2024-06-07 DIAGNOSIS — Z23 Encounter for immunization: Secondary | ICD-10-CM

## 2024-06-07 DIAGNOSIS — R0609 Other forms of dyspnea: Secondary | ICD-10-CM

## 2024-06-07 DIAGNOSIS — Z599 Problem related to housing and economic circumstances, unspecified: Secondary | ICD-10-CM

## 2024-06-07 DIAGNOSIS — R0600 Dyspnea, unspecified: Secondary | ICD-10-CM

## 2024-06-07 DIAGNOSIS — E118 Type 2 diabetes mellitus with unspecified complications: Secondary | ICD-10-CM

## 2024-06-07 DIAGNOSIS — F341 Dysthymic disorder: Secondary | ICD-10-CM | POA: Diagnosis not present

## 2024-06-07 DIAGNOSIS — Z716 Tobacco abuse counseling: Secondary | ICD-10-CM

## 2024-06-07 DIAGNOSIS — E782 Mixed hyperlipidemia: Secondary | ICD-10-CM

## 2024-06-07 MED ORDER — ALBUTEROL SULFATE HFA 108 (90 BASE) MCG/ACT IN AERS: 2.0000 | INHALATION_SPRAY | Freq: Four times a day (QID) | RESPIRATORY_TRACT | 0 refills | Status: AC | PRN

## 2024-06-07 MED ORDER — GLIPIZIDE 5 MG PO TABS: ORAL_TABLET | ORAL | 3 refills | Status: DC

## 2024-06-07 MED ORDER — OZEMPIC (0.25 OR 0.5 MG/DOSE) 2 MG/3ML ~~LOC~~ SOPN: PEN_INJECTOR | SUBCUTANEOUS | 11 refills | Status: DC

## 2024-06-07 MED ORDER — ATORVASTATIN CALCIUM 80 MG PO TABS: ORAL_TABLET | ORAL | 3 refills | Status: AC

## 2024-06-07 MED ORDER — ASPIRIN EC 81 MG PO TBEC: DELAYED_RELEASE_TABLET | ORAL | 3 refills | Status: AC

## 2024-06-07 MED ORDER — METFORMIN HCL 1000 MG PO TABS: 1000.0000 mg | ORAL_TABLET | Freq: Two times a day (BID) | ORAL | 3 refills | Status: AC

## 2024-06-07 MED ORDER — FAMOTIDINE 20 MG PO TABS: ORAL_TABLET | ORAL | 3 refills | Status: AC

## 2024-06-07 MED ORDER — BUPROPION HCL ER (SR) 150 MG PO TB12: ORAL_TABLET | ORAL | 11 refills | Status: DC

## 2024-06-07 MED ORDER — LANTUS SOLOSTAR 100 UNIT/ML ~~LOC~~ SOPN: PEN_INJECTOR | SUBCUTANEOUS | 11 refills | Status: DC

## 2024-06-07 MED ORDER — EMPAGLIFLOZIN 10 MG PO TABS: 10.0000 mg | ORAL_TABLET | Freq: Every day | ORAL | 3 refills | Status: AC

## 2024-06-07 MED ORDER — GABAPENTIN 100 MG PO CAPS: ORAL_CAPSULE | ORAL | 11 refills | Status: AC

## 2024-06-07 NOTE — Progress Notes (Unsigned)
 Subjective:    Patient ID: Ryan Hensley, male   DOB: 02-02-1971, 53 y.o.   MRN: [id number]   HPI  Here for Male CPE:  1.  STE:  Does not perform.  No family history of testicular cancer.   2.  PSA: Below 0.1 in July.  Followed by Urology in [place].    3.  Guaiac Cards/FIT:  Never returned.    4.  Colonoscopy:  Colonoscopy and EGD 02/2023.  Dr. Stacia, GI.  One adenoma without high grade dysplasia.  Gastric antrum with scarring on path.  Due again for colonoscopy in 2031.    5.  Cholesterol/Glucose:  Has been off all meds for months.  He cannot say when.  A1C in July was 6.7%.  Cholesterol in April was at goal save for low HDL and slightly high LDL  He thinks he was off meds in July and probably still taking in April.  Lipid Panel     Component Value Date/Time   CHOL 146 12/01/2023 1154   TRIG 88 12/01/2023 1154   HDL 44 12/01/2023 1154   LDLCALC 85 12/01/2023 1154   LABVLDL 17 12/01/2023 1154     6.  Immunizations: Needs COVID vaccine Immunization History  Administered Date(s) Administered   Influenza, Seasonal, Injecte, Preservative Fre 04/28/2024   Influenza,inj,Quad PF,6+ Mos 07/24/2022   Influenza-Unspecified 07/24/2022, 05/07/2023   PFIZER(Purple Top)SARS-COV-2 Vaccination 03/19/2020, 04/09/2020   PNEUMOCOCCAL CONJUGATE-20 12/01/2023   Pfizer Fall 2024 Covid-19 Vaccine 6mos thru 79yrs 07/24/2022   Pfizer(Comirnaty)Fall Seasonal Vaccine 12 years and older 07/24/2022   Pneumococcal Polysaccharide-23 03/14/2019   Tdap 08/17/2014   Zoster Recombinant(Shingrix ) 10/22/2022, 06/02/2023     Current Meds  Medication Sig   aspirin  EC 81 MG tablet 1 tab by mouth daily with morning meal (Patient taking differently: 81 mg daily. 1 tab by mouth daily with morning meal; takes is occasionally.)   Blood Glucose Monitoring Suppl (AGAMATRIX PRESTO) w/Device KIT Check blood sugars twice daily before meals   famotidine  (PEPCID ) 20 MG tablet TAKE 2 TABLETS BY MOUTH AT  BEDTIME   Glucose Blood (ACCU-CHEK ACTIVE VI) 1 Device by In Vitro route in the morning and at bedtime.   Semaglutide ,0.25 or 0.5MG /DOS, (OZEMPIC , 0.25 OR 0.5 MG/DOSE,) 2 MG/3ML SOPN Inject 0.5 mg subcutaneously once weekly (Patient taking differently: Inject 0.5 mg subcutaneously once weekly)   Allergies  Allergen Reactions   Shellfish Allergy Swelling   Past Medical History:  Diagnosis Date   BPH (benign prostatic hyperplasia)    Diabetic neuropathy (HCC)    Diabetic retinopathy (HCC)    DM (diabetes mellitus), type 2 (HCC)    Dyspnea    Elevated PSA    GERD (gastroesophageal reflux disease)    Grade I diastolic dysfunction    Hyperlipidemia    Left eye injury, sequela 1978   Prostate cancer (HCC) 02/2023   Tobacco abuse    Past Surgical History:  Procedure Laterality Date   boxer fracture ORIF Right 2016   EYE SURGERY  2007   Strabismus surgery, left eye.  Age 33 yo.   PELVIC LYMPH NODE DISSECTION Bilateral 01/31/2024   Procedure: LYMPHADENECTOMY, PELVIS;  Surgeon: Penne Knee, MD;  Location: [facility] OR[facility];  Service: Urology;  Laterality: Bilateral;   ROBOT ASSISTED LAPAROSCOPIC RADICAL PROSTATECTOMY N/A 01/31/2024   Procedure: PROSTATECTOMY, RADICAL, ROBOT-ASSISTED, LAPAROSCOPIC;  Surgeon: Penne Knee, MD;  Location: [facility];  Service: Urology;  Laterality: N/A;   Family History  Problem Relation Age of Onset   Hypertension Mother  Asthma Sister    Gout Brother    Kidney Stones Brother    Social History   Socioeconomic History   Marital status: Single    Spouse name: Not on file   Number of children: 1   Years of education: 11   Highest education level: Not on file  Occupational History   Occupation: Disability  Tobacco Use   Smoking status: Every Day    Current packs/day: 0.25    Average packs/day: 0.3 packs/day for 37.7 years (9.4 ttl pk-yrs)    Types: Cigarettes    Start date: 09/14/1986    Passive exposure: Current   Smokeless tobacco: Never   Vaping Use   Vaping status: Never Used  Substance and Sexual Activity   Alcohol use: Not Currently    Comment: rare   Drug use: Yes    Types: Marijuana    Comment: Smokes MJ on weekend.   Sexual activity: Not Currently  Other Topics Concern   Not on file  Social History Narrative   Lives with his brother.   Social Drivers of Health   Financial Resource Strain: Medium Risk (06/07/2024)   Overall Financial Resource Strain (CARDIA)    Difficulty of Paying Living Expenses: Somewhat hard  Food Insecurity: Food Insecurity Present (06/07/2024)   Hunger Vital Sign    Worried About Running Out of Food in the Last Year: Sometimes true    Ran Out of Food in the Last Year: Sometimes true  Transportation Needs: No Transportation Needs (01/31/2024)   PRAPARE - Administrator, Civil Service (Medical): No    Lack of Transportation (Non-Medical): No  Physical Activity: Not on file  Stress: Not on file  Social Connections: Unknown (12/30/2021)   Received from [facility]   Social Network    Social Network: Not on file  Intimate Partner Violence: Not At Risk (06/07/2024)   Humiliation, Afraid, Rape, and Kick questionnaire    Fear of Current or Ex-Partner: No    Emotionally Abused: No    Physically Abused: No    Sexually Abused: No      Review of Systems  HENT:  Positive for dental problem (Had teeth removed, but feels he has pieces left in and tender.  Unable to wear dentures.  States they were fitted when his gums were still swollen after surgery.).   Respiratory:  Positive for shortness of breath (When walking.  Still smoking.).   Cardiovascular:  Negative for chest pain, palpitations and leg swelling.  Gastrointestinal:  Negative for abdominal pain and blood in stool (No melena).  Genitourinary:        ED.  Has some urinary incontinence with cough, etc.    Skin:        Another cyst has formed in left axilla  Neurological:  Negative for weakness and numbness.   Psychiatric/Behavioral:  Positive for dysphoric mood (Feels he is isolated.). The patient is not nervous/anxious.       Objective:   BP 110/65 (BP Location: Right Arm, Patient Position: Sitting, Cuff Size: Normal)   Pulse 64   Resp 18   Ht 5' 8 (1.727 m)   Wt 163 lb 8 oz (74.2 kg)   BMI 24.86 kg/m   Physical Exam Constitutional:      Appearance: Normal appearance.  HENT:     Head: Normocephalic and atraumatic.     Right Ear: Tympanic membrane, ear canal and external ear normal.     Left Ear: Tympanic membrane, ear canal and  external ear normal.     Nose: Nose normal.     Mouth/Throat:     Mouth: Mucous membranes are moist.     Pharynx: Oropharynx is clear.     Comments: Gums flattened.  Edentulous.   Eyes:     Extraocular Movements: Extraocular movements intact.     Conjunctiva/sclera: Conjunctivae normal.     Comments: Left pupil round and reactive to light.  Left pupil fixed in semi-dilated position, irregular and opaque    Neck:     Thyroid: No thyroid mass or thyromegaly.  Cardiovascular:     Rate and Rhythm: Normal rate and regular rhythm.     Pulses:          Dorsalis pedis pulses are 2+ on the right side and 2+ on the left side.       Posterior tibial pulses are 2+ on the right side and 2+ on the left side.     Heart sounds: S1 normal and S2 normal. No murmur heard.    No friction rub. No S3 or S4 sounds.     Comments: No carotid bruits.  Carotid, radial, femoral, DP and PT pulses normal and equal.  Ankles dry and flaky Pulmonary:     Effort: Pulmonary effort is normal.     Breath sounds: Normal breath sounds and air entry.  Abdominal:     General: Abdomen is flat. Bowel sounds are normal.     Palpations: Abdomen is soft. There is no hepatomegaly, splenomegaly or mass.     Tenderness: There is no abdominal tenderness.     Hernia: No hernia is present.     Comments: Laparoscopic scars from prostectomy well healed.  Genitourinary:    Comments: Deferred to  Urology and GI. Musculoskeletal:        General: Normal range of motion.     Cervical back: Normal range of motion and neck supple.     Right lower leg: No edema.     Left lower leg: No edema.     Comments: Left knee with bony hypertrophic changes.  Feet:     Right foot:     Protective Sensation: 10 sites tested.  10 sites sensed.     Skin integrity: Skin integrity normal.     Toenail Condition: Fungal disease present.    Left foot:     Protective Sensation: 10 sites tested.  10 sites sensed.     Skin integrity: Skin integrity normal.     Toenail Condition: Fungal disease present. Lymphadenopathy:     Head:     Right side of head: No submental or submandibular adenopathy.     Left side of head: No submental or submandibular adenopathy.     Cervical: No cervical adenopathy.     Upper Body:     Right upper body: No supraclavicular or axillary adenopathy.     Left upper body: No supraclavicular or axillary (No palpable cyst or mass in left axilla where he intermittently finds bump) adenopathy.     Lower Body: No right inguinal adenopathy. No left inguinal adenopathy.  Skin:    General: Skin is warm.     Capillary Refill: Capillary refill takes less than 2 seconds.     Findings: No rash.  Neurological:     General: No focal deficit present.     Mental Status: He is alert and oriented to person, place, and time.     Cranial Nerves: Cranial nerves 2-12 are intact.  Sensory: Sensation is intact.     Motor: Motor function is intact.     Coordination: Coordination is intact.     Gait: Gait is intact.     Deep Tendon Reflexes: Reflexes are normal and symmetric.  Psychiatric:        Speech: Speech normal.        Behavior: Behavior normal. Behavior is cooperative.      Assessment & Plan   CPE Spikevax COVID vaccine No FIT as colonoscopy in 2024. Fasting labs a week from this Friday.    2.  DM with microalbuminuria:  not taking any meds.  Problems with [facility]--will send to  [facility] on [address].  Restart Ozempic  at 0.25 mg weekly as sounds like off meds for many months.   Restart Lantus , Glipizide , Metformin , Jardiance  with plans to wean to just Ozempic  and Jardiance  if possible Return shortly for labs.   Bring in sugar logs and weigh in with fasting labs.  3.  Exertional Dyspnea:  did not complete follow up with Cardiology--do not see CT of chest for calcium  score was performed.  Echo with mild diastolic dysfunction, but no regional wall motion abnormality.   Will check in Cardiology--likely fell away from follow up as about the time he was being evaluated for prostate CA. Albuterol HFA 2 puffs every 6 hours as needed--instructed on use.  CXR.  Consider PFTs  4. Hyperlipidemia:  restart statin.  5.  Tobacco Use Disorder:  States he is only smoking 2 cigarettes daily, but smells very strongly of cigarette smoke.  Encouraged nicorette  gum again.      6.  Housing concerns:  has roach infestation.  Referral to [facility] for referral to [employer].  6.

## 2024-06-07 NOTE — Patient Instructions (Signed)
 Go get CXR:  [address]:  [employer]

## 2024-06-08 DIAGNOSIS — F341 Dysthymic disorder: Secondary | ICD-10-CM | POA: Insufficient documentation

## 2024-06-08 DIAGNOSIS — Z599 Problem related to housing and economic circumstances, unspecified: Secondary | ICD-10-CM | POA: Insufficient documentation

## 2024-06-14 ENCOUNTER — Other Ambulatory Visit

## 2024-06-14 DIAGNOSIS — C61 Malignant neoplasm of prostate: Secondary | ICD-10-CM

## 2024-06-14 DIAGNOSIS — N528 Other male erectile dysfunction: Secondary | ICD-10-CM

## 2024-06-15 LAB — PSA: Prostate Specific Ag, Serum: <0.1 ng/mL (ref 0.0–4.0)

## 2024-06-16 ENCOUNTER — Other Ambulatory Visit (INDEPENDENT_AMBULATORY_CARE_PROVIDER_SITE_OTHER)

## 2024-06-16 ENCOUNTER — Telehealth: Payer: Self-pay

## 2024-06-16 DIAGNOSIS — E118 Type 2 diabetes mellitus with unspecified complications: Secondary | ICD-10-CM | POA: Diagnosis not present

## 2024-06-16 DIAGNOSIS — E782 Mixed hyperlipidemia: Secondary | ICD-10-CM

## 2024-06-16 NOTE — Telephone Encounter (Signed)
 Patient reports that a cyst under right armpit that was previously removed has started regrown. Patient reports that it is itchy. Reports no pain in the area. Has no used medication. Please advise.

## 2024-06-16 NOTE — Telephone Encounter (Signed)
 Patient reports that he currently does not feel or see the cyst under his arm. Patient told to call us  if the cyst grows back. Also told to use Dove body soap for armpit itch.

## 2024-06-17 LAB — COMPREHENSIVE METABOLIC PANEL WITH GFR
ALT: 20 IU/L (ref 0–44)
AST: 17 IU/L (ref 0–40)
Albumin: 4.2 g/dL (ref 3.8–4.9)
Alkaline Phosphatase: 115 IU/L (ref 47–123)
BUN/Creatinine Ratio: 9 (ref 9–20)
BUN: 10 mg/dL (ref 6–24)
Bilirubin Total: 0.5 mg/dL (ref 0.0–1.2)
CO2: 25 mmol/L (ref 20–29)
Calcium: 10 mg/dL (ref 8.7–10.2)
Chloride: 104 mmol/L (ref 96–106)
Creatinine, Ser: 1.12 mg/dL (ref 0.76–1.27)
Globulin, Total: 3.3 g/dL (ref 1.5–4.5)
Glucose: 160 mg/dL — ABNORMAL HIGH (ref 70–99)
Potassium: 5 mmol/L (ref 3.5–5.2)
Sodium: 144 mmol/L (ref 134–144)
Total Protein: 7.5 g/dL (ref 6.0–8.5)
eGFR: 79 mL/min/1.73 (ref >59)

## 2024-06-17 LAB — CBC WITH DIFFERENTIAL/PLATELET
Basophils Absolute: 0.1 x10E3/uL (ref 0.0–0.2)
Basos: 1 %
EOS (ABSOLUTE): 0.4 x10E3/uL (ref 0.0–0.4)
Eos: 4 %
Hematocrit: 46.8 % (ref 37.5–51.0)
Hemoglobin: 15.4 g/dL (ref 13.0–17.7)
Immature Grans (Abs): 0 x10E3/uL (ref 0.0–0.1)
Immature Granulocytes: 0 %
Lymphocytes Absolute: 4.7 x10E3/uL — ABNORMAL HIGH (ref 0.7–3.1)
Lymphs: 47 %
MCH: 29.2 pg (ref 26.6–33.0)
MCHC: 32.9 g/dL (ref 31.5–35.7)
MCV: 89 fL (ref 79–97)
Monocytes Absolute: 0.5 x10E3/uL (ref 0.1–0.9)
Monocytes: 5 %
Neutrophils Absolute: 4.3 x10E3/uL (ref 1.4–7.0)
Neutrophils: 43 %
Platelets: 227 x10E3/uL (ref 150–450)
RBC: 5.27 x10E6/uL (ref 4.14–5.80)
RDW: 13.4 % (ref 11.6–15.4)
WBC: 9.9 x10E3/uL (ref 3.4–10.8)

## 2024-06-17 LAB — HEMOGLOBIN A1C
Est. average glucose Bld gHb Est-mCnc: 189 mg/dL
Hgb A1c MFr Bld: 8.2 % — ABNORMAL HIGH (ref 4.8–5.6)

## 2024-06-17 LAB — MICROALBUMIN / CREATININE URINE RATIO
Creatinine, Urine: 106.1 mg/dL
Microalb/Creat Ratio: 15 mg/g{creat} (ref 0–29)
Microalbumin, Urine: 16.3 ug/mL

## 2024-06-17 LAB — LIPID PANEL
Chol/HDL Ratio: 4.2 ratio (ref 0.0–5.0)
Cholesterol, Total: 177 mg/dL (ref 100–199)
HDL: 42 mg/dL (ref >39)
LDL Chol Calc (NIH): 113 mg/dL — ABNORMAL HIGH (ref 0–99)
Triglycerides: 120 mg/dL (ref 0–149)
VLDL Cholesterol Cal: 22 mg/dL (ref 5–40)

## 2024-06-21 ENCOUNTER — Ambulatory Visit: Admitting: Physician Assistant

## 2024-06-21 ENCOUNTER — Telehealth: Payer: Self-pay | Admitting: Physician Assistant

## 2024-06-21 VITALS — BP 106/72 | HR 92 | Ht 69.0 in | Wt 163.0 lb

## 2024-06-21 DIAGNOSIS — N393 Stress incontinence (female) (male): Secondary | ICD-10-CM | POA: Diagnosis not present

## 2024-06-21 DIAGNOSIS — N5231 Erectile dysfunction following radical prostatectomy: Secondary | ICD-10-CM

## 2024-06-21 DIAGNOSIS — Y838 Other surgical procedures as the cause of abnormal reaction of the patient, or of later complication, without mention of misadventure at the time of the procedure: Secondary | ICD-10-CM | POA: Diagnosis not present

## 2024-06-21 DIAGNOSIS — Z9079 Acquired absence of other genital organ(s): Secondary | ICD-10-CM

## 2024-06-21 DIAGNOSIS — C61 Malignant neoplasm of prostate: Secondary | ICD-10-CM | POA: Diagnosis not present

## 2024-06-21 MED ORDER — SILDENAFIL CITRATE 100 MG PO TABS: 100.0000 mg | ORAL_TABLET | Freq: Every day | ORAL | 11 refills | Status: DC

## 2024-06-21 NOTE — Patient Instructions (Signed)
    [email or web address]  The Wiesner Incontinence Clamp is an external medical device used to control urine leakage by compressing the urethra and preventing the flow of urine   Lets you maintain your active lifestyle Cost effective, saving you lots of money on adult incontinence briefs Can be worn during any activity Ergonomic design promotes confidence and provides all-day comfort  Step 1: Open the incontinence clamp by releasing the catch and lifting up the top part.   Step 2: Place your penis between the silicone rubber pads with the incontinence clamp about halfway down the shaft of your penis.   Step 3: Latch the incontinence clamp to compress the urethra at the level that's comfortable to you.

## 2024-06-21 NOTE — Telephone Encounter (Signed)
 Can you please contact [employer] and initiate an incontinence supply order for this patient?  He is using 2 pull-ups daily for management of stress incontinence after prostatectomy.

## 2024-06-21 NOTE — Progress Notes (Signed)
 06/21/2024 10:45 AM   Ryan Hensley [birth year] [id number]  CC: Chief Complaint  Patient presents with   Prostate Cancer   HPI: Ryan Hensley is a 53 y.o. male with PMH diabetes, ED, and prostate cancer s/p RALP with Dr. Penne in June with a focally positive margin on pathology who presents today for follow up.   He is currently doing pelvic floor PT.  PSA remains undetectable as of 06/14/2024.  Today he reports stress incontinence, for which she wears 2 pull-ups daily.  He has cost concerns associated with these.  He has been doing Kegel exercises, and they help considerably.  He is not able to achieve any degree of spontaneous erections.  His libido is unaffected.  He finds this very frustrating.  He never took sildenafil  due to cost.  PMH: Past Medical History:  Diagnosis Date   BPH (benign prostatic hyperplasia)    Diabetic neuropathy (HCC)    Diabetic retinopathy (HCC)    DM (diabetes mellitus), type 2 (HCC)    Dyspnea    Elevated PSA    GERD (gastroesophageal reflux disease)    Grade I diastolic dysfunction    Hyperlipidemia    Left eye injury, sequela 1978   Prostate cancer (HCC) 02/2023   Tobacco abuse     Surgical History: Past Surgical History:  Procedure Laterality Date   boxer fracture ORIF Right 2016   EYE SURGERY  2007   Strabismus surgery, left eye.  Age 43 yo.   PELVIC LYMPH NODE DISSECTION Bilateral 01/31/2024   Procedure: LYMPHADENECTOMY, PELVIS;  Surgeon: Penne Knee, MD;  Location: [facility] ORS;  Service: Urology;  Laterality: Bilateral;   ROBOT ASSISTED LAPAROSCOPIC RADICAL PROSTATECTOMY N/A 01/31/2024   Procedure: PROSTATECTOMY, RADICAL, ROBOT-ASSISTED, LAPAROSCOPIC;  Surgeon: Penne Knee, MD;  Location: [facility] ORS;  Service: Urology;  Laterality: N/A;    Home Medications:  Allergies as of 06/21/2024       Reactions   Shellfish Allergy Swelling        Medication List        Accurate as of June 21, 2024 10:45 AM. If you have any  questions, ask your nurse or doctor.          ACCU-CHEK ACTIVE VI 1 Device by In Vitro route in the morning and at bedtime.   albuterol 108 (90 Base) MCG/ACT inhaler Commonly known as: VENTOLIN HFA Inhale 2 puffs into the lungs every 6 (six) hours as needed for wheezing or shortness of breath.   aspirin  EC 81 MG tablet 1 tab by mouth daily with morning meal   atorvastatin  80 MG tablet Commonly known as: LIPITOR TAKE 1 TABLET BY MOUTH WITH EVENING MEAL   buPROPion  150 MG 12 hr tablet Commonly known as: WELLBUTRIN  SR 1 tab by mouth in morning daily for 3 days then increase to 1 tab by mouth twice daily on day 4   empagliflozin  10 MG Tabs tablet Commonly known as: Jardiance  Take 1 tablet (10 mg total) by mouth daily before breakfast.   famotidine  20 MG tablet Commonly known as: PEPCID  TAKE 2 TABLETS BY MOUTH AT BEDTIME   gabapentin  100 MG capsule Commonly known as: NEURONTIN  CONTINUE 3 CAPSULES AT NIGHT AND ADD 1 CAPSULE IN THE MORNING AND INCREASE BY 1 CAPSULE EVERY 3 DAYS UNTIL TAKING 3 CAPSULES 3 TIMES DAILY   glipiZIDE  5 MG tablet Commonly known as: GLUCOTROL  TAKE 1 TABLET BY MOUTH TWICE DAILY WITH MEALS   ibuprofen  400 MG tablet Commonly known as: ADVIL  Take 1  tablet (400 mg total) by mouth every 8 (eight) hours as needed for moderate pain.   insulin  glargine-yfgn 100 UNIT/ML Pen Commonly known as: SEMGLEE INJECT 15 UNITS SUBCUTANEOUS IN THE MORNING BEFORE BREAKFAST   metFORMIN  1000 MG tablet Commonly known as: GLUCOPHAGE  Take 1 tablet (1,000 mg total) by mouth 2 (two) times daily with a meal.   Ozempic  (0.25 or 0.5 MG/DOSE) 2 MG/3ML Sopn Generic drug: Semaglutide (0.25 or 0.5MG /DOS) Inject 0.25 mg subcutaneously once weekly        Allergies:  Allergies  Allergen Reactions   Shellfish Allergy Swelling    Family History: Family History  Problem Relation Age of Onset   Hypertension Mother    Asthma Sister    Gout Brother    Kidney Stones Brother      Social History:   reports that he has been smoking cigarettes. He started smoking about 37 years ago. He has a 9.4 pack-year smoking history. He has been exposed to tobacco smoke. He has never used smokeless tobacco. He reports that he does not currently use alcohol. He reports current drug use. Drug: Marijuana.  Physical Exam: BP 106/72   Pulse 92   Ht 5' 9 (1.753 m)   Wt 163 lb (73.9 kg)   BMI 24.07 kg/m   Constitutional:  Alert and oriented, no acute distress, nontoxic appearing HEENT: [place], AT Cardiovascular: No clubbing, cyanosis, or edema Respiratory: Normal respiratory effort, no increased work of breathing Skin: No rashes, bruises or suspicious lesions Neurologic: Grossly intact, no focal deficits, moving all 4 extremities Psychiatric: Normal mood and affect  Assessment & Plan:   1. Prostate cancer (HCC) (Primary) PSA remains undetectable, will continue to monitor.  2. 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  I encouraged him to continue both of these.  I gave him information about penile incontinence clamps as an alternative to absorbent products.  I will attempt to order pull-ups from [employer] through his insurance to help offset the cost.  3. 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  Will start tadalafil for penile rehab.  Poor baseline erections, so unclear how much benefit will be achieved with this. - tadalafil (CIALIS) 5 MG tablet; Take 1 tablet (5 mg total) by mouth daily.   Return in about 6 months (around 12/19/2024) for Prostate cancer f/u with PSA prior.  Lucie Hones, PA-C  [facility] [place] [address] [place], [place] [address] [phone]

## 2024-06-22 ENCOUNTER — Ambulatory Visit: Admitting: Physician Assistant

## 2024-06-22 NOTE — Telephone Encounter (Signed)
 Contacted [employer] to initiate a new order for patient. Awaiting order by fax for provider signature.

## 2024-06-22 NOTE — Telephone Encounter (Signed)
 Order signed and faxed back to [employer] along with Office notes. See media.

## 2024-06-23 MED ORDER — TADALAFIL 5 MG PO TABS: 5.0000 mg | ORAL_TABLET | Freq: Every day | ORAL | Status: AC

## 2024-06-23 NOTE — Addendum Note (Signed)
 Addended by: Arlisa Leclere P on: 06/23/2024 12:16 PM   Modules accepted: Orders

## 2024-07-06 ENCOUNTER — Telehealth: Payer: Self-pay | Admitting: Physical Therapy

## 2024-07-06 ENCOUNTER — Ambulatory Visit: Attending: Urology | Admitting: Physical Therapy

## 2024-07-06 NOTE — Telephone Encounter (Signed)
 Therapist called pt about missed appt.  He said he didn't know he had appt but he confirmed he will come for 12/4 @10 :15 appt

## 2024-07-18 ENCOUNTER — Telehealth: Payer: Self-pay

## 2024-07-18 DIAGNOSIS — E118 Type 2 diabetes mellitus with unspecified complications: Secondary | ICD-10-CM

## 2024-07-18 MED ORDER — INSULIN GLARGINE-YFGN 100 UNIT/ML ~~LOC~~ SOPN: PEN_INJECTOR | SUBCUTANEOUS | 4 refills | Status: DC

## 2024-07-18 NOTE — Telephone Encounter (Signed)
 The patient is notified about Rx sent to pharmacy

## 2024-07-20 ENCOUNTER — Ambulatory Visit: Admitting: Physical Therapy

## 2024-07-20 DIAGNOSIS — M533 Sacrococcygeal disorders, not elsewhere classified: Secondary | ICD-10-CM | POA: Diagnosis present

## 2024-07-20 DIAGNOSIS — R2689 Other abnormalities of gait and mobility: Secondary | ICD-10-CM | POA: Diagnosis present

## 2024-07-20 DIAGNOSIS — R279 Unspecified lack of coordination: Secondary | ICD-10-CM | POA: Insufficient documentation

## 2024-07-20 NOTE — Therapy (Addendum)
 OUTPATIENT PHYSICAL THERAPY TREATMENT   / Recert/ Discharge Summary  across 7 visits   Patient Name: Ryan Hensley MRN: [id number] DOB:[birth year], 53 y.o., male Today's Date: 07/20/2024   PT End of Session - 07/20/24 1051     Visit Number 7    Number of Visits 10    Date for Recertification  07/21/24    PT Start Time 1020    PT Stop Time 1100    PT Time Calculation (min) 40 min    Activity Tolerance Patient tolerated treatment well;No increased pain    Behavior During Therapy WFL for tasks assessed/performed          Past Medical History:  Diagnosis Date   BPH (benign prostatic hyperplasia)    Diabetic neuropathy (HCC)    Diabetic retinopathy (HCC)    DM (diabetes mellitus), type 2 (HCC)    Dyspnea    Elevated PSA    GERD (gastroesophageal reflux disease)    Grade I diastolic dysfunction    Hyperlipidemia    Left eye injury, sequela 1978   Prostate cancer (HCC) 02/2023   Tobacco abuse    Past Surgical History:  Procedure Laterality Date   boxer fracture ORIF Right 2016   EYE SURGERY  2007   Strabismus surgery, left eye.  Age 57 yo.   PELVIC LYMPH NODE DISSECTION Bilateral 01/31/2024   Procedure: LYMPHADENECTOMY, PELVIS;  Surgeon: Penne Knee, MD;  Location: [facility];  Service: Urology;  Laterality: Bilateral;   ROBOT ASSISTED LAPAROSCOPIC RADICAL PROSTATECTOMY N/A 01/31/2024   Procedure: PROSTATECTOMY, RADICAL, ROBOT-ASSISTED, LAPAROSCOPIC;  Surgeon: Penne Knee, MD;  Location: [facility];  Service: Urology;  Laterality: N/A;   Patient Active Problem List   Diagnosis Date Noted   Housing problems 06/08/2024   Dysthymia 06/08/2024   Onychomycosis 06/02/2023   Microalbuminuria 06/02/2023   Prostate cancer (HCC) 02/2023   Tobacco abuse disorder 01/28/2023   Exertional dyspnea 01/28/2023   Elevated PSA 10/30/2022   Benign prostatic hyperplasia (BPH) with straining on urination 10/30/2022   Lumbar back pain with radiculopathy affecting right lower extremity  02/25/2022   Food insecurity 02/25/2022   Cutaneous abscess of back (any part, except buttock) 12/19/2018   Gastroesophageal reflux disease 12/13/2018   Mixed hyperlipidemia 12/13/2018   Diabetic peripheral neuropathy (HCC) 12/13/2018   Diabetic retinopathy associated with type 2 diabetes mellitus (HCC) 12/13/2018   Type 2 diabetes mellitus with complication, without long-term current use of insulin  (HCC) 12/13/2018   Left eye injury, sequela 1978    PCP: Adella   REFERRING PROVIDER: Leocadia MART DIAG: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 Pt continues to d his exercises.  Pt noticed midback pain this morning   LBP has been the same . Pt feels the weather affects it. But he still does his exercises.    SUBJECTIVE STATEMENT ON  EVAL  03/27/24   1) Urinary leakage after RALP surgery:  leakage first caused pt to wear 10 Pull ups a day but now he is down to 5 Pull ups a day  Sometimes he does not feel the leakage occurring. Leakage with coughing . Pt sometimes  sense urge to urination and sometimes he does not    2) LBP: pt has LBP for years prior to surgery. Pt points to both sides at SIJ area and it does not radiate down the leg. R hip is worse. Pt is about to schedule hip surgery.  LBP is occur all day , walking, stairs,  Pt is using scooter when grocery shopping. Pt wants to get a handicap sticker but he has not gotten one from his MD. Pt had pain walking parking lot to [employer]  10+ /10 pain    Pt sits on a recliner all day. Pt is in a recliner starting 5:48 am to 10pm unless he is going to the store. Pt leans on the R side of his recliner       PERTINENT HISTORY:    PAIN:  Are you having pain? Yes: see above   PRECAUTIONS: no lifting > 10 lbs   WEIGHT BEARING RESTRICTIONS:   No   FALLS:  Has patient fallen in last 6 months?  No   LIVING ENVIRONMENT: Lives with: brother  Lives in: One story  Stairs: 15 STE with rail and a hill .   I am scared of his house because the stairs look like they are about to fall off the house  Assistance Device at home:  WC, crutches, SPC   OCCUPATION: not working, on disability   PLOF: IND   PATIENT GOALS:   Get everything working to where it was     OBJECTIVE:     [facility] PT Assessment - 07/20/24 1100       Palpation   SI assessment  levelled shoulder and pelvis      Ambulation/Gait   Gait Comments 1.53 m/s ( reciprocal gait) but required cues for longer stride length          [facility] Adult PT Treatment/Exercise - 07/20/24 1100       Self-Care   Other Self-Care Comments  Reassessed goals  educated on neuroscience of pain, provided Why do I still hurt? by Yancy Agee. pt declined referral to Pain Clinic, encouraged pt to maintain his HEP, for pain management and continence   Discussed d/c      Neuro Re-ed    Neuro Re-ed Details  provided propioceptive cues for spinal stretches to address his c/o midback pain ,  cued for motor planning in logrolling to minimize straining back, visual cues for longer stride length in gait to maintain SIJ and spinal ROM and less pain, more blood flow for aerobic activity                HOME EXERCISE PROGRAM: See pt instruction section    ASSESSMENT:  CLINICAL IMPRESSION:  Pt achieved 100% of his goals. Pt remained compliant with HEP   Improvements include :  Pt showed levelled pelvis girdle and less slouched posture since last visit. Tailbone is no longer flexed, SIJ mobility is restored bilaterally  Pt has more upright posture which helps with IAP system for continence and pain management  Pt reported  decreased pull-ups from 5 per day to 2 per day     Pt has no more pain with sitting on soft surfaces.  He avoids hard surfaces             These improvements will help promote optimize IAP system for improved pelvic floor function, trunk stability, gait, balance, stabilization with mobility tasks.    Reassessed goals today.   Educated on neuroscience of pain,  provided Why do I still hurt? by Yancy Agee. pt declined referral to Pain Clinic, encouraged pt to maintain his HEP, for pain management and continence   Provided propioceptive cues for spinal stretches to address his c/o midback pain ,  cued for motor planning in logrolling to minimize straining back, visual cues for longer stride length in gait to maintain SIJ and spinal ROM and less pain, more blood flow for aerobic activity   Discussed d/c   Pt is ready for d/c today    OBJECTIVE IMPAIRMENTS decreased activity tolerance, decreased coordination, decreased endurance, decreased mobility, difficulty walking, decreased ROM, decreased strength, decreased safety awareness, hypomobility, increased muscle spasms, impaired flexibility, improper body mechanics, postural dysfunction, and pain. scar restrictions   ACTIVITY LIMITATIONS  self-care,  sleep, home chores, work tasks    PARTICIPATION LIMITATIONS:  community, gym activities    PERSONAL FACTORS   affecting patient's functional outcome:    REHAB POTENTIAL: Good   CLINICAL DECISION MAKING: Evolving/moderate complexity   EVALUATION COMPLEXITY: Moderate    PATIENT EDUCATION:    Education details: Showed pt anatomy images. Explained muscles attachments/ connection, physiology of deep core system/ spinal- thoracic-pelvis-lower kinetic chain as they relate to pt's presentation, Sx, and past Hx. Explained what and how these areas of deficits need to be restored to balance and function    See Therapeutic activity / neuromuscular re-education section  Answered pt's questions.    Person educated: Patient Education method: Explanation, Demonstration, Tactile cues, Verbal cues, and Handouts Education comprehension: verbalized understanding, returned demonstration, verbal cues required, tactile cues required, and needs further education     PLAN: PT FREQUENCY: 1x/week   PT DURATION: 10 weeks   PLANNED INTERVENTIONS:   Gait training;Stair training;Functional mobility training;DME Instruction;Therapeutic activities;Therapeutic exercise;Balance training;Neuromuscular re-education;Patient/family education;Vestibular;Visual/perceptual remediation/compensation;Passive range of motion;Moist Heat;Cryotherapy;Traction;Canalith Repostioning;Joint Manipulations;Manual lymph drainage;Manual techniques;Scar mobilization;Energy conservation;Dry needling;ADLs/Self Care Home Management;Biofeedback;Electrical Stimulation;Taping    PLAN FOR NEXT SESSION: See clinical impression for plan     GOALS: Goals reviewed with patient? Yes  SHORT TERM GOALS: Target date: 04/24/2024    Pt will demo IND with HEP                    Baseline: Not IND            Goal status: MET    LONG TERM GOALS: Target date: 07/21/24     1.Pt will demo proper deep core coordination without chest breathing and optimal excursion of diaphragm/pelvic floor in order to promote spinal stability and pelvic floor function  Baseline: dyscoordination Goal status: MET   2.  Pt will demo proper body mechanics in against gravity tasks and ADLs  work tasks, fitness  to minimize straining pelvic floor / back    Baseline: not IND, improper form that places strain on pelvic floor  Goal status: MET     3. Pt will demo increased gait speed > 1.3 m/s with reciprocal gait pattern, longer stride length  in order to ambulate safely in community and return to fitness routine  Baseline: 0.96 m/s , pelvic sway to R, limited posterior rotatin of L pelvis, R thorax  Goal status: MET ( 1.53 m/s, reciprocal gait)      4.  Pt will demo levelled pelvic girdle and shoulder height in order to progress to deep core strengthening HEP and restore mobility at spine, pelvis, gait, posture minimize falls, and improve balance  Baseline: R shoulder and iliac crest lowered  Goal status: MET   5.  Pt will decrease wearing pull ups to < 4 a day as a sign of continence improvement   Baseline: 5 Pull ups a day  Goal status: MET ( 2 pull ups)   6. Pt will be report decreased time in recliner all day by 50% to increase mobility and improve posture Baseline: Pt is in a recliner starting 5:48 am to 10pm unless he is going to the store. Pt leans on the R side of his recliner  Goal status: MET     Pia Lupe Plump, PT 07/20/2024, 10:52 AM

## 2024-07-20 NOTE — Patient Instructions (Addendum)
 Pain science info by Yancy Agee Why Do I still hurt   Graded movement and continue with daily exercises   Take longer stride length when walking for maintaining mobility of spine and pelvis

## 2024-07-31 ENCOUNTER — Ambulatory Visit
Admission: EM | Admit: 2024-07-31 | Discharge: 2024-07-31 | Disposition: A | Discharge status: Home / Self Care | Attending: Family Medicine | Admitting: Family Medicine

## 2024-07-31 DIAGNOSIS — R000 Tachycardia, unspecified: Secondary | ICD-10-CM

## 2024-07-31 DIAGNOSIS — I10 Essential (primary) hypertension: Secondary | ICD-10-CM

## 2024-07-31 DIAGNOSIS — E119 Type 2 diabetes mellitus without complications: Secondary | ICD-10-CM

## 2024-07-31 DIAGNOSIS — R112 Nausea with vomiting, unspecified: Secondary | ICD-10-CM

## 2024-07-31 DIAGNOSIS — E162 Hypoglycemia, unspecified: Secondary | ICD-10-CM

## 2024-07-31 LAB — GLUCOSE, POCT (MANUAL RESULT ENTRY): POC Glucose: 56 mg/dL — AB (ref 70–99)

## 2024-07-31 MED ORDER — ONDANSETRON 4 MG PO TBDP
4.0000 mg | ORAL_TABLET | Freq: Once | ORAL | Status: AC
  Administered 2024-07-31: 19:00:00 4 mg via ORAL

## 2024-07-31 MED ORDER — OMEPRAZOLE 20 MG PO CPDR: 20.0000 mg | DELAYED_RELEASE_CAPSULE | Freq: Every day | ORAL | 0 refills | Status: AC

## 2024-07-31 MED ORDER — ONDANSETRON 4 MG PO TBDP: 4.0000 mg | ORAL_TABLET | Freq: Three times a day (TID) | ORAL | 0 refills | Status: AC | PRN

## 2024-07-31 NOTE — ED Notes (Signed)
 Pt given peanut butter crackers and gatorade per PA C advisement

## 2024-07-31 NOTE — ED Triage Notes (Signed)
 Pt reports his heart started racing and blood sugar 55 around 1400 today, reports he had 2 pieces on candy and blood sugar went tup to 60. Pt reports his heart feels racing fast at this moment, lightheaded, acid reflux is active right now. Denies chest pain.

## 2024-07-31 NOTE — ED Provider Notes (Signed)
 [facility]  Note:  This document was prepared using Conservation officer, historic buildings and may include unintentional dictation errors.  MRN: [id number] DOB: 1970/10/26  Subjective:   Ryan Hensley is a 53 y.o. male presenting for concerns of racing heartbeat, nausea, vomiting for the past couple of days.  Patient has been trying to eat small meals just did not throw up.  Has also been trying to take sips of liquids.  Has felt that his acid reflux is also aggravated.  Has had a lot of burping and sometimes ends up throwing up as he burps.  Has taken famotidine  long-term daily.  Patient is also on insulin  for his diabetes, takes long-acting insulin  once daily in the morning.  Unfortunately he did take his blood sugar today but only after he took his insulin  and noticed that it was low.  He is also on Ozempic .  Has been on this for some time, is not any medication.  Denies confusion, headache, weakness, numbness or tingling, chest pain.  No hematemesis. No fever, recent antibiotic use, hospitalizations or long distance travel.  Has not eaten raw foods, drank unfiltered water.  No history of Crohn's, IBS, ulcerative colitis.  Patient had a prostatectomy here 01/31/2024 with pelvic lymph node dissection.  Current Outpatient Medications  Medication Instructions   albuterol  (VENTOLIN  HFA) 108 (90 Base) MCG/ACT inhaler 2 puffs, Inhalation, Every 6 hours PRN   aspirin  EC 81 MG tablet 1 tab by mouth daily with morning meal   atorvastatin  (LIPITOR) 80 MG tablet TAKE 1 TABLET BY MOUTH WITH EVENING MEAL   buPROPion  (WELLBUTRIN  SR) 150 MG 12 hr tablet 1 tab by mouth in morning daily for 3 days then increase to 1 tab by mouth twice daily on day 4   empagliflozin  (JARDIANCE ) 10 mg, Oral, Daily before breakfast   famotidine  (PEPCID ) 20 MG tablet TAKE 2 TABLETS BY MOUTH AT BEDTIME   gabapentin  (NEURONTIN ) 100 MG capsule CONTINUE 3 CAPSULES AT NIGHT AND ADD 1 CAPSULE IN THE MORNING AND  INCREASE BY 1 CAPSULE EVERY 3 DAYS UNTIL TAKING 3 CAPSULES 3 TIMES DAILY   glipiZIDE  (GLUCOTROL ) 5 MG tablet TAKE 1 TABLET BY MOUTH TWICE DAILY WITH MEALS   Glucose Blood (ACCU-CHEK ACTIVE VI) 1 Device, 2 times daily   ibuprofen  (ADVIL ) 400 mg, Oral, Every 8 hours PRN   insulin  glargine-yfgn (SEMGLEE ) 100 UNIT/ML Pen INJECT 15 UNITS SUBCUTANEOUS IN THE MORNING BEFORE BREAKFAST   metFORMIN  (GLUCOPHAGE ) 1,000 mg, Oral, 2 times daily with meals   Semaglutide ,0.25 or 0.5MG /DOS, (OZEMPIC , 0.25 OR 0.5 MG/DOSE,) 2 MG/3ML SOPN Inject 0.25 mg subcutaneously once weekly   tadalafil  (CIALIS ) 5 mg, Oral, Daily    Allergies[1]  Past Medical History:  Diagnosis Date   BPH (benign prostatic hyperplasia)    Diabetic neuropathy (HCC)    Diabetic retinopathy (HCC)    DM (diabetes mellitus), type 2 (HCC)    Dyspnea    Elevated PSA    GERD (gastroesophageal reflux disease)    Grade I diastolic dysfunction    Hyperlipidemia    Left eye injury, sequela 1978   Prostate cancer (HCC) 02/2023   Tobacco abuse      Past Surgical History:  Procedure Laterality Date   boxer fracture ORIF Right 2016   EYE SURGERY  2007   Strabismus surgery, left eye.  Age 70 yo.   PELVIC LYMPH NODE DISSECTION Bilateral 01/31/2024   Procedure: LYMPHADENECTOMY, PELVIS;  Surgeon: Penne Knee, MD;  Location: [facility];  Service: Urology;  Laterality: Bilateral;   ROBOT ASSISTED LAPAROSCOPIC RADICAL PROSTATECTOMY N/A 01/31/2024   Procedure: PROSTATECTOMY, RADICAL, ROBOT-ASSISTED, LAPAROSCOPIC;  Surgeon: Penne Knee, MD;  Location: [facility];  Service: Urology;  Laterality: N/A;    Family History  Problem Relation Age of Onset   Hypertension Mother    Asthma Sister    Gout Brother    Kidney Stones Brother     Social History   Occupational History   Occupation: Disability  Tobacco Use   Smoking status: Every Day    Current packs/day: 0.25    Average packs/day: 0.3 packs/day for 37.9 years (9.5 ttl pk-yrs)     Types: Cigarettes    Start date: 09/14/1986    Passive exposure: Current   Smokeless tobacco: Never  Vaping Use   Vaping status: Never Used  Substance and Sexual Activity   Alcohol use: Not Currently    Comment: rare   Drug use: Yes    Types: Marijuana    Comment: Smokes MJ on weekend.   Sexual activity: Not Currently     ROS   Objective:   Vitals: BP (!) 165/93 (BP Location: Right Arm)   Pulse 70   Temp 98.9 F (37.2 C) (Oral)   Resp 18   SpO2 98%   BP Readings from Last 3 Encounters:  07/31/24 (!) 165/93  06/21/24 106/72  06/07/24 110/65    Physical Exam Constitutional:      General: He is not in acute distress.    Appearance: Normal appearance. He is well-developed. He is not ill-appearing, toxic-appearing or diaphoretic.  HENT:     Head: Normocephalic and atraumatic.     Right Ear: External ear normal.     Left Ear: External ear normal.     Nose: Nose normal.     Mouth/Throat:     Mouth: Mucous membranes are moist.  Eyes:     General: No scleral icterus.       Right eye: No discharge.        Left eye: No discharge.     Extraocular Movements: Extraocular movements intact.  Cardiovascular:     Rate and Rhythm: Normal rate and regular rhythm.     Heart sounds: Normal heart sounds. No murmur heard.    No friction rub. No gallop.  Pulmonary:     Effort: Pulmonary effort is normal. No respiratory distress.     Breath sounds: Normal breath sounds. No stridor. No wheezing, rhonchi or rales.  Neurological:     Mental Status: He is alert and oriented to person, place, and time.  Psychiatric:        Mood and Affect: Mood normal.        Behavior: Behavior normal.        Thought Content: Thought content normal.    ED ECG REPORT   Date: 07/31/2024  EKG Time: 7:56 PM  Rate: 71bpm  Rhythm: normal sinus rhythm,  unchanged from previous tracings  Axis: normal  Intervals:none  ST&T Change: Nonspecific T wave flattening in lead aVL  Narrative Interpretation:  Sinus rhythm at 71 bpm with nonspecific T wave change as above in a single lead.  Very comparable to previous EKG.  Results for orders placed or performed during the hospital encounter of 07/31/24 (from the past 24 hours)  POCT CBG (manual entry)     Status: Abnormal   Collection Time: 07/31/24  6:49 PM  Result Value Ref Range   POC Glucose 56 (A) 70 - 99 mg/dl  P.o. Zofran  4 mg administered in clinic.  Patient was only willing to drink Gatorade in clinic to help his blood sugar.  Assessment and Plan :   PDMP not reviewed this encounter.  1. Nausea and vomiting, unspecified vomiting type   2. Type 2 diabetes mellitus treated with insulin  (HCC)   3. Essential hypertension   4. Racing heart beat   5. Hypoglycemia      Extensive discussion with patient about his differential.  Suspect gastroenteritis, GERD.  Recommended conservative management, add Prilosec to his regimen of famotidine .  Use Zofran  for supportive care.  No signs of an acute abdomen.  Follow-up with PCP, consider referral to cardiology should his heart racing symptoms persist.  Counseled patient on potential for adverse effects with medications prescribed/recommended today, maintain strict ER precautions and return-to-clinic precautions discussed, patient verbalized understanding.     [1]  Allergies Allergen Reactions   Shellfish Allergy Swelling     Christopher Savannah, PA-C 07/31/24 1958

## 2024-07-31 NOTE — Discharge Instructions (Addendum)
 Hold your insulin  dosing today. Make sure you push fluids.  Try to eat light meals including soups, broths and soft foods, fruits.  You may use Zofran  for your nausea and vomiting once every 8 hours.  Please return to the clinic if symptoms worsen or you start having severe abdominal pain not helped by taking Tylenol  or start having bloody stools or blood in the vomit. Start Prilosec to help with your GERD, acid reflux. Hold your insulin  tomorrow. Keep close eyes on your blood sugar. The safest range is between 90-120.

## 2024-08-01 ENCOUNTER — Telehealth: Payer: Self-pay | Admitting: Internal Medicine

## 2024-08-01 NOTE — Telephone Encounter (Signed)
 Patient called today and states that he went to urgent care yesterday 07/31/24. Patient states he was told by urgent care to call his primary care doctor and notify of this visit so doctor can check the notes.

## 2024-08-03 ENCOUNTER — Other Ambulatory Visit: Payer: Self-pay | Admitting: Internal Medicine

## 2024-08-24 ENCOUNTER — Ambulatory Visit: Admitting: Internal Medicine

## 2024-08-24 ENCOUNTER — Encounter: Payer: Self-pay | Admitting: Internal Medicine

## 2024-08-24 VITALS — BP 140/80 | HR 74 | Ht 69.0 in | Wt 156.0 lb

## 2024-08-24 DIAGNOSIS — E162 Hypoglycemia, unspecified: Secondary | ICD-10-CM

## 2024-08-24 DIAGNOSIS — E118 Type 2 diabetes mellitus with unspecified complications: Secondary | ICD-10-CM

## 2024-08-24 DIAGNOSIS — Z720 Tobacco use: Secondary | ICD-10-CM

## 2024-08-24 DIAGNOSIS — K529 Noninfective gastroenteritis and colitis, unspecified: Secondary | ICD-10-CM | POA: Diagnosis not present

## 2024-08-24 DIAGNOSIS — Z716 Tobacco abuse counseling: Secondary | ICD-10-CM

## 2024-08-24 NOTE — Patient Instructions (Addendum)
 Hold Glipizide  tonight with your evening meal, but start it back up tomorrow morning with breakfast and then again at dinner. No insulin  going forward No candy before checking your sugar (or ever) Call your morning sugar to me tomorrow.  Take 2 capsules or 40 mg of Omeprazole  (Prilosec) at same time in the morning 1/2 hour before breakfast.  Keep checking your sugars twice daily before meals  Please eat at regular times 3 times daily and make it healthy

## 2024-08-24 NOTE — Progress Notes (Signed)
 "   Subjective:    Patient ID: Ryan Hensley, male   DOB: [birth year], 54 y.o.   MRN: [id number]   HPI   DM:  having low glucoses and did not give us  a call.  He was seen for CPE in October and was only able to afford his Ozempic  0.5 mg and had stopped everything else for DM.  States he finally was able to fill his prescriptions on 12/15 and started all of his diabetic meds back up with a lower dose of Ozempic  0.25 mg.  He developed nausea and vomiting with diarrhea about the same time.  He also noted his sugars to drop into as low as 50s  He did hold his insulin  at times when sugars low and started eating candy in the morning before checking his sugars.  Last A1C end of October was high as expected off meds at 8.2%, up from 6.7 % in July.  2.  Continued gas, nausea, belching, poor diet.  He is having diarrheal stools as well.  No blood in stool.  Not clear if any melena.   He was diagnosed as having a viral gastroenteritis in UC.  Started on Zofran  and Omeprazole  20 mg but is also continuing on Famotidine .   He drinks coffee once in am and smokes both cigarettes and MJ.  HOB is elevated  3.   Hyperlipidemia:  not at goal end of October with LDL at 113, but again, likely off atorvastatin  at the time.  4.  Tobacco use disorder:  continues with 1/4 ppd.  Not clear he is willing to quit.   Current Meds  Medication Sig   albuterol  (VENTOLIN  HFA) 108 (90 Base) MCG/ACT inhaler Inhale 2 puffs into the lungs every 6 (six) hours as needed for wheezing or shortness of breath. (Patient taking differently: Inhale 2 puffs into the lungs daily.)   atorvastatin  (LIPITOR) 80 MG tablet TAKE 1 TABLET BY MOUTH WITH EVENING MEAL   buPROPion  (WELLBUTRIN  SR) 150 MG 12 hr tablet TAKE 1 TABLET BY MOUTH EACH MORNING FOR 3 DAYS, THEN TAKE 1 TABLET TWICE DAILY THEREAFTER   empagliflozin  (JARDIANCE ) 10 MG TABS tablet Take 1 tablet (10 mg total) by mouth daily before breakfast.   famotidine  (PEPCID ) 20 MG tablet TAKE 2  TABLETS BY MOUTH AT BEDTIME   gabapentin  (NEURONTIN ) 100 MG capsule CONTINUE 3 CAPSULES AT NIGHT AND ADD 1 CAPSULE IN THE MORNING AND INCREASE BY 1 CAPSULE EVERY 3 DAYS UNTIL TAKING 3 CAPSULES 3 TIMES DAILY   glipiZIDE  (GLUCOTROL ) 5 MG tablet TAKE 1 TABLET BY MOUTH TWICE DAILY WITH MEALS   Glucose Blood (ACCU-CHEK ACTIVE VI) 1 Device by In Vitro route in the morning and at bedtime.   ibuprofen  (ADVIL ) 400 MG tablet Take 1 tablet (400 mg total) by mouth every 8 (eight) hours as needed for moderate pain.   insulin  glargine-yfgn (SEMGLEE ) 100 UNIT/ML Pen INJECT 15 UNITS SUBCUTANEOUS IN THE MORNING BEFORE BREAKFAST   metFORMIN  (GLUCOPHAGE ) 1000 MG tablet Take 1 tablet (1,000 mg total) by mouth 2 (two) times daily with a meal.   omeprazole  (PRILOSEC) 20 MG capsule Take 1 capsule (20 mg total) by mouth daily.   ondansetron  (ZOFRAN -ODT) 4 MG disintegrating tablet Take 1 tablet (4 mg total) by mouth every 8 (eight) hours as needed for nausea or vomiting.   Semaglutide ,0.25 or 0.5MG /DOS, (OZEMPIC , 0.25 OR 0.5 MG/DOSE,) 2 MG/3ML SOPN Inject 0.25 mg subcutaneously once weekly   tadalafil  (CIALIS ) 5 MG tablet Take 1 tablet (5  mg total) by mouth daily.   Allergies  Allergen Reactions   Shellfish Allergy Swelling     Review of Systems    Objective:   BP (!) 140/80 (BP Location: Left Arm, Patient Position: Sitting, Cuff Size: Normal)   Pulse 74   Ht 5' 9 (1.753 m)   Wt 156 lb (70.8 kg)   BMI 23.04 kg/m   Physical Exam HENT:     Head: Normocephalic and atraumatic.     Right Ear: Tympanic membrane normal.     Left Ear: Tympanic membrane normal.     Nose: Nose normal.     Mouth/Throat:     Mouth: Mucous membranes are moist.     Pharynx: Oropharynx is clear.  Cardiovascular:     Rate and Rhythm: Normal rate and regular rhythm.     Pulses: Normal pulses.     Heart sounds: S1 normal and S2 normal. No murmur heard.    No friction rub. No S3 or S4 sounds.  Pulmonary:     Effort: Pulmonary  effort is normal.     Breath sounds: Normal breath sounds and air entry.  Abdominal:     General: Bowel sounds are normal.     Palpations: Abdomen is soft. There is no hepatomegaly, splenomegaly or mass.     Tenderness: There is no abdominal tenderness.     Hernia: No hernia is present.  Musculoskeletal:     Cervical back: Normal range of motion and neck supple.     Right lower leg: No edema.     Left lower leg: No edema.  Lymphadenopathy:     Head:     Right side of head: No submental or submandibular adenopathy.     Left side of head: No submental or submandibular adenopathy.     Cervical: No cervical adenopathy.     Upper Body:     Right upper body: No supraclavicular adenopathy.     Left upper body: No supraclavicular adenopathy.  Neurological:     General: No focal deficit present.     Mental Status: He is alert and oriented to person, place, and time.      Assessment & Plan   DM with hypoglycemia:  prefers to stop insulin  over glipizide , so will do that.  He will hold the glipizide  tonight, but restart twice daily tomorrow.  Call with blood glucose tomorrow morning.  Continue Ozempic  and Jardiance   2.  GI gaseousness and belching/nausea:  Hold the Famotidine , but increase Omeprazole  to 40 mg in morning on empty stomach.   Afraid of needles and would like to avoid blood draws today.    3.  Tobacco Use Disorder:  encouraged nicotine  gum for nicotine  rather than cigarettes.  He is not convinced.   "

## 2024-08-25 ENCOUNTER — Telehealth: Payer: Self-pay | Admitting: Internal Medicine

## 2024-08-25 NOTE — Telephone Encounter (Signed)
 Patient has been seen.

## 2024-08-25 NOTE — Telephone Encounter (Signed)
 Patient has been notified

## 2024-08-25 NOTE — Telephone Encounter (Signed)
 Patient called to report his sugar level for this morning . Patient states his sugar level fasting is 98.

## 2024-08-25 NOTE — Telephone Encounter (Signed)
 Per Dr. Adella- Much better sugar , to continue with no insulin  but still take his other diabetic medicine. No change to those.

## 2024-08-31 ENCOUNTER — Other Ambulatory Visit

## 2024-08-31 VITALS — Wt 152.0 lb

## 2024-08-31 DIAGNOSIS — E118 Type 2 diabetes mellitus with unspecified complications: Secondary | ICD-10-CM | POA: Diagnosis not present

## 2024-08-31 NOTE — Progress Notes (Unsigned)
 Weight today = 152 lb last time 156lb   Sugars AM 71-136  one day 187   Pm 65- 113  He takes Jardiance  10mg  . Glipizide  5 mg , metformin  1000mg  Ozempic  0.5 , for more than three months    The patient is notified about the changes of today and scheduled for labs in two weeks

## 2024-09-01 NOTE — Progress Notes (Signed)
 Stopped insulin  on 1/8 as low blood sugars Sugars in pm running low at times, so will stop morning Glipizide . Sugars in morning where we want them, so stay on evening Glipizide  Follow up with sugar log in 2 weeks and to call for elevated sugars or if still too low, especially in the morning.

## 2024-09-15 ENCOUNTER — Other Ambulatory Visit

## 2024-09-15 VITALS — Wt 148.2 lb

## 2024-09-15 DIAGNOSIS — E118 Type 2 diabetes mellitus with unspecified complications: Secondary | ICD-10-CM

## 2024-09-15 MED ORDER — BLOOD GLUCOSE MONITORING SUPPL DEVI: 1.0000 | 0 refills | Status: AC

## 2024-09-15 MED ORDER — BLOOD GLUCOSE TEST VI STRP: 1.0000 | ORAL_STRIP | 11 refills | Status: AC

## 2024-09-15 MED ORDER — OZEMPIC (0.25 OR 0.5 MG/DOSE) 2 MG/3ML ~~LOC~~ SOPN: PEN_INJECTOR | SUBCUTANEOUS | 11 refills | Status: AC

## 2024-09-15 MED ORDER — LANCET DEVICE MISC: 1.0000 | 0 refills | Status: AC

## 2024-09-15 MED ORDER — LANCETS MISC: 1.0000 | 11 refills | Status: AC

## 2024-09-15 NOTE — Progress Notes (Signed)
 Patient using Ozempic  0.5 mg weekly and has been on this dose for several weeks--unable to find when he went back up before last visit. Injects on Monday.    Has been off insulin  since 08/24/2024  Also using Glipizide  5 mg and Metformin  1000 mg twice daily Jardiance  10 mg daily.    AM sugars:  Generally 85 to 135, but had two mornings where sugar up to 152 and 171.  He believes he had more carbs the night before.    PM sugars:  103 to 128  Weight 148.25 lbs  Stop glipizide . For some reason, Ozempic  still listed as 0.5 mg, but he increased to 0.5 before last visit.  Refilled. Follow up in 2 weeks for weight check and glucose log

## 2024-09-29 ENCOUNTER — Ambulatory Visit

## 2024-10-09 ENCOUNTER — Other Ambulatory Visit

## 2024-10-12 ENCOUNTER — Ambulatory Visit: Admitting: Internal Medicine

## 2024-12-13 ENCOUNTER — Other Ambulatory Visit

## 2024-12-19 ENCOUNTER — Ambulatory Visit: Admitting: Physician Assistant

## 2024-12-21 ENCOUNTER — Ambulatory Visit: Admitting: Urology

## 2025-06-07 ENCOUNTER — Other Ambulatory Visit: Admitting: Internal Medicine

## 2025-06-11 ENCOUNTER — Encounter: Admitting: Internal Medicine
